# Patient Record
Sex: Female | Born: 1965
Health system: Southern US, Community
[De-identification: ages and names within clinical notes are randomized; demographics above are authoritative.]

## PROBLEM LIST (undated history)

## (undated) DIAGNOSIS — E875 Hyperkalemia: Secondary | ICD-10-CM

## (undated) DIAGNOSIS — E559 Vitamin D deficiency, unspecified: Secondary | ICD-10-CM

## (undated) DIAGNOSIS — E669 Obesity, unspecified: Secondary | ICD-10-CM

## (undated) DIAGNOSIS — G43909 Migraine, unspecified, not intractable, without status migrainosus: Secondary | ICD-10-CM

## (undated) DIAGNOSIS — E538 Deficiency of other specified B group vitamins: Secondary | ICD-10-CM

## (undated) DIAGNOSIS — K769 Liver disease, unspecified: Secondary | ICD-10-CM

## (undated) DIAGNOSIS — K219 Gastro-esophageal reflux disease without esophagitis: Secondary | ICD-10-CM

## (undated) DIAGNOSIS — K760 Fatty (change of) liver, not elsewhere classified: Secondary | ICD-10-CM

## (undated) DIAGNOSIS — B019 Varicella without complication: Secondary | ICD-10-CM

## (undated) DIAGNOSIS — R942 Abnormal results of pulmonary function studies: Secondary | ICD-10-CM

## (undated) DIAGNOSIS — I10 Essential (primary) hypertension: Secondary | ICD-10-CM

## (undated) DIAGNOSIS — M1990 Unspecified osteoarthritis, unspecified site: Secondary | ICD-10-CM

## (undated) HISTORY — DX: Migraine, unspecified, not intractable, without status migrainosus: G43.909

## (undated) HISTORY — DX: Deficiency of other specified B group vitamins: E53.8

## (undated) HISTORY — DX: Gastro-esophageal reflux disease without esophagitis: K21.9

## (undated) HISTORY — DX: Liver disease, unspecified: K76.9

## (undated) HISTORY — DX: Hyperkalemia: E87.5

## (undated) HISTORY — DX: Varicella without complication: B01.9

## (undated) HISTORY — DX: Obesity, unspecified: E66.9

## (undated) HISTORY — DX: Fatty (change of) liver, not elsewhere classified: K76.0

## (undated) HISTORY — DX: Unspecified osteoarthritis, unspecified site: M19.90

## (undated) HISTORY — DX: Essential (primary) hypertension: I10

## (undated) HISTORY — PX: MEDIAL PARTIAL KNEE REPLACEMENT: SHX5965

## (undated) HISTORY — DX: Vitamin D deficiency, unspecified: E55.9

## (undated) HISTORY — DX: Abnormal results of pulmonary function studies: R94.2

---

## 1999-03-02 ENCOUNTER — Encounter: Payer: Self-pay | Admitting: Family Medicine

## 1999-03-02 ENCOUNTER — Encounter: Admission: RE | Admit: 1999-03-02 | Discharge: 1999-03-02 | Payer: Self-pay | Admitting: Family Medicine

## 2000-03-21 ENCOUNTER — Encounter: Admission: RE | Admit: 2000-03-21 | Discharge: 2000-03-21 | Payer: Self-pay | Admitting: Family Medicine

## 2000-03-21 ENCOUNTER — Encounter: Payer: Self-pay | Admitting: Family Medicine

## 2001-03-13 ENCOUNTER — Encounter: Payer: Self-pay | Admitting: Family Medicine

## 2001-03-13 ENCOUNTER — Encounter: Admission: RE | Admit: 2001-03-13 | Discharge: 2001-03-13 | Payer: Self-pay | Admitting: Family Medicine

## 2002-09-13 ENCOUNTER — Ambulatory Visit (HOSPITAL_BASED_OUTPATIENT_CLINIC_OR_DEPARTMENT_OTHER): Admission: RE | Admit: 2002-09-13 | Discharge: 2002-09-13 | Payer: Self-pay | Admitting: Pulmonary Disease

## 2003-11-03 ENCOUNTER — Other Ambulatory Visit: Admission: RE | Admit: 2003-11-03 | Discharge: 2003-11-03 | Payer: Self-pay | Admitting: Obstetrics & Gynecology

## 2004-08-12 ENCOUNTER — Inpatient Hospital Stay (HOSPITAL_COMMUNITY): Admission: AD | Admit: 2004-08-12 | Discharge: 2004-08-14 | Payer: Self-pay | Admitting: Obstetrics & Gynecology

## 2004-11-22 ENCOUNTER — Other Ambulatory Visit: Admission: RE | Admit: 2004-11-22 | Discharge: 2004-11-22 | Payer: Self-pay | Admitting: Obstetrics & Gynecology

## 2006-06-13 ENCOUNTER — Encounter: Admission: RE | Admit: 2006-06-13 | Discharge: 2006-06-13 | Payer: Self-pay | Admitting: Obstetrics & Gynecology

## 2007-02-13 ENCOUNTER — Ambulatory Visit: Payer: Self-pay | Admitting: Gastroenterology

## 2007-02-13 LAB — CONVERTED CEMR LAB
A-1 Antitrypsin, Ser: 230 mg/dL — ABNORMAL HIGH (ref 83–200)
Angiotensin 1 Converting Enzyme: 22 units/L (ref 9–67)
Anti Nuclear Antibody(ANA): NEGATIVE
HCV Ab: NEGATIVE
Hepatitis B Surface Ag: NEGATIVE
Iron: 77 ug/dL (ref 42–145)
Saturation Ratios: 18.3 % — ABNORMAL LOW (ref 20.0–50.0)
Total Protein: 7.4 g/dL (ref 6.0–8.3)

## 2007-04-10 ENCOUNTER — Ambulatory Visit: Payer: Self-pay | Admitting: Cardiology

## 2007-04-10 DIAGNOSIS — D134 Benign neoplasm of liver: Secondary | ICD-10-CM

## 2007-04-18 ENCOUNTER — Ambulatory Visit: Payer: Self-pay | Admitting: Gastroenterology

## 2007-06-22 DIAGNOSIS — R945 Abnormal results of liver function studies: Secondary | ICD-10-CM

## 2007-06-22 DIAGNOSIS — G4733 Obstructive sleep apnea (adult) (pediatric): Secondary | ICD-10-CM | POA: Insufficient documentation

## 2007-06-22 DIAGNOSIS — G43909 Migraine, unspecified, not intractable, without status migrainosus: Secondary | ICD-10-CM | POA: Insufficient documentation

## 2007-09-24 ENCOUNTER — Telehealth (INDEPENDENT_AMBULATORY_CARE_PROVIDER_SITE_OTHER): Payer: Self-pay

## 2007-10-02 ENCOUNTER — Telehealth: Payer: Self-pay | Admitting: Gastroenterology

## 2007-10-04 ENCOUNTER — Ambulatory Visit: Payer: Self-pay | Admitting: Cardiology

## 2008-12-17 ENCOUNTER — Telehealth: Payer: Self-pay | Admitting: Gastroenterology

## 2009-01-02 ENCOUNTER — Encounter: Admission: RE | Admit: 2009-01-02 | Discharge: 2009-01-02 | Payer: Self-pay | Admitting: Obstetrics & Gynecology

## 2009-01-06 ENCOUNTER — Ambulatory Visit: Payer: Self-pay | Admitting: Cardiology

## 2009-01-09 ENCOUNTER — Encounter: Admission: RE | Admit: 2009-01-09 | Discharge: 2009-01-09 | Payer: Self-pay | Admitting: Obstetrics & Gynecology

## 2009-06-29 ENCOUNTER — Telehealth (INDEPENDENT_AMBULATORY_CARE_PROVIDER_SITE_OTHER): Payer: Self-pay | Admitting: *Deleted

## 2009-12-09 ENCOUNTER — Telehealth (INDEPENDENT_AMBULATORY_CARE_PROVIDER_SITE_OTHER): Payer: Self-pay

## 2009-12-11 ENCOUNTER — Encounter: Payer: Self-pay | Admitting: Gastroenterology

## 2009-12-15 ENCOUNTER — Telehealth (INDEPENDENT_AMBULATORY_CARE_PROVIDER_SITE_OTHER): Payer: Self-pay

## 2010-01-19 ENCOUNTER — Encounter: Admission: RE | Admit: 2010-01-19 | Discharge: 2010-01-19 | Payer: Self-pay | Admitting: Obstetrics & Gynecology

## 2010-02-08 ENCOUNTER — Telehealth: Payer: Self-pay | Admitting: Gastroenterology

## 2010-02-17 ENCOUNTER — Telehealth: Payer: Self-pay | Admitting: Gastroenterology

## 2010-03-29 ENCOUNTER — Telehealth (INDEPENDENT_AMBULATORY_CARE_PROVIDER_SITE_OTHER): Payer: Self-pay

## 2010-03-29 ENCOUNTER — Encounter: Admission: RE | Admit: 2010-03-29 | Discharge: 2010-03-29 | Payer: Self-pay | Admitting: Gastroenterology

## 2010-03-31 ENCOUNTER — Encounter
Admission: RE | Admit: 2010-03-31 | Discharge: 2010-03-31 | Payer: Self-pay | Source: Home / Self Care | Admitting: Gastroenterology

## 2010-05-16 ENCOUNTER — Encounter: Payer: Self-pay | Admitting: Gastroenterology

## 2010-05-25 NOTE — Progress Notes (Signed)
Summary: Sch'd MRI  Phone Note Call from Patient Call back at [phone]   Caller: Patient Call For: Dr. Russella Dar Reason for Call: Talk to Nurse Summary of Call: Needs to sch'd her MRI appt. Initial call taken by: Karna Christmas,  February 17, 2010 10:23 AM  Follow-up for Phone Call        Patient  is scheduled for MRI abdomen wiht and without contrast attention to the liver at [facility] for 02/26/10 8:00 Follow-up by: Darcey Nora RN, CGRN,  February 17, 2010 11:11 AM

## 2010-05-25 NOTE — Letter (Signed)
Summary: Appointment Reminder  [facility]  [address] [place], [place] [address]   Phone: [phone]  Fax: [phone]        December 11, 2009 MRN: [id number]    [name] [name] [address] [place], [place]  [address]    Dear Ms. Sculley,   We have been unable to reach you by phone to schedule a follow up CT   scan appointment that was recommended for you by Dr. Russella Dar.  It is very   important that we reach you to schedule an appointment. We hope that you  allow [place] to participate in your health care needs. Please contact us at  [phone] at your earliest convenience to schedule your appointment.     Sincerely,    Darcey Nora RN, CGRN

## 2010-05-25 NOTE — Progress Notes (Signed)
Summary: Records request from [facility]  Request for records received from [facility]. Request forwarded to [employer]. Dena Chavis  June 29, 2009 4:26 PM

## 2010-05-25 NOTE — Progress Notes (Signed)
----   Converted from flag ---- ---- 02/08/2010 9:07 AM, Karna Christmas wrote: Dr. Barbaraann Barthel would like to discuss pt.'s liver findings at your convenience.  # [id number] ------------------------------  02/08/10 @ 1140. Returned Dr. Barbaraann Barthel call. Pt. interested in when to stop imaging follow up studies and why MRI this time. Outlined [facility] could be diagnosed with MRI and Eovist and MR has no radiation exposure. We can discontinue folllow up imaging if the liver lesion is stable or smaller and has no worisome features on MRI.

## 2010-05-25 NOTE — Progress Notes (Signed)
Summary: Schedule MRI  Phone Note Outgoing Call Call back at [facility] Phone [phone] Call back at [phone]   Call placed by: Darcey Nora RN, CGRN,  December 15, 2009 8:40 AM Call placed to: Patient Summary of Call: Patient  returned my call about scheduling MRI recommended from CT scan 12/2008.  Patient  asks if this is something that can be delayed, she has multiple medical bills now.  Please review CT scan from 12/2008 and advise. Initial call taken by: Darcey Nora RN, CGRN,  December 15, 2009 8:43 AM  Follow-up for Phone Call        Can delay 3-6 months Follow-up by: Meryl Dare MD Clementeen Graham,  December 15, 2009 8:53 AM  Additional Follow-up for Phone Call Additional follow up Details #1::        Patient  aware, I will contact her in January to reschedule Additional Follow-up by: Darcey Nora RN, CGRN,  December 15, 2009 9:16 AM     Appended Document: Schedule MRI MRI with Eovist when she is ready to schedule

## 2010-05-25 NOTE — Progress Notes (Signed)
Summary: MRI done without EOVIST  Phone Note Outgoing Call   Call placed by: Darcey Nora RN, CGRN,  March 29, 2010 4:32 PM Call placed to: [facility] Summary of Call: I spoke with [place] at [facility].  MRI was supposed to be done with Eovist not contrast.  They will look into it and call me back about what needs to be done. Initial call taken by: Darcey Nora RN, CGRN,  March 29, 2010 4:33 PM  Follow-up for Phone Call        I spoke with Vickie from [facility] they will contact the patient to come back for additional imaging.  Dr Russella Dar aware..  Per Vickie patient will not be charged for additional MRI Follow-up by: Darcey Nora RN, CGRN,  March 29, 2010 4:42 PM

## 2010-05-25 NOTE — Progress Notes (Signed)
Summary: Schedule repeat MRI  Phone Note Outgoing Call Call back at [facility] Phone [phone]   Call placed by: Darcey Nora RN, CGRN,  December 09, 2009 10:19 AM Call placed to: Patient Summary of Call: Left message for patient to call back to discuss scheduling repeat MRI Initial call taken by: Darcey Nora RN, CGRN,  December 09, 2009 10:20 AM  Follow-up for Phone Call        Left message for patient to call back Darcey Nora RN, [facility]  December 10, 2009 10:02 AM  Left message for patient to call back.  NO return calls  from patient I have mailed her a letter. Follow-up by: Darcey Nora RN, CGRN,  December 11, 2009 11:14 AM

## 2010-09-07 NOTE — Assessment & Plan Note (Signed)
[facility]                         GASTROENTEROLOGY OFFICE NOTE   NAME:[name] [name], [name] [name]                   MRN:          [id number]  DATE:02/13/2007                            DOB:          September 24, 1965    REFERRING PHYSICIAN:  Marjory Lies, M.D.   REASON FOR CONSULTATION:  Liver lesion on ultrasound and CT scan.   HISTORY OF PRESENT ILLNESS:  The patient is a 45 year old white female  who is previously evaluated by Iva Boop, MD, Clementeen Graham.  I see her  husband as a patient and she has requested to change gastroenterologists  for ongoing care.  She was evaluated by Dr. Leone Payor in 2004 for abnormal  liver function tests which were felt to be likely due to fatty liver.  She had a history of elevated triglycerides and obesity.  In addition,  she has a brother with nonalcoholic steatohepatitis.  Standard liver  serologies were unremarkable.  She recently developed problems with  right upper quadrant and epigastric pain with a chest fullness, gas, and  belching and she underwent an abdominal ultrasound on January 09, 2007, at [facility] which showed fatty infiltration of the  liver and 2.8 x 3.5 cm smooth hypoechoic left hepatic lobe lesion.  The  remainder of the ultrasound study was unremarkable.  She subsequently  had a CT scan of the abdomen at [facility] on January 11, 2007, which showed an enhancing oval well-circumscribed lesion in the  left lobe of the liver correlating with the lesion on ultrasound.  The  lesion measured 2.8 cm x 2.2 cm and was felt likely to represent a  benign lesion, most likely a hepatic adenoma.  In addition, she had  extensive fatty infiltration in the remainder of the liver parenchyma.  The remainder of the CT was unremarkable.  A recent lipid panel revealed  a slightly elevated cholesterol of 207 and elevated triglycerides at  171.  Recent liver function tests showed an AST of 62, ALT 85,  and total  bilirubin of 1.3.  She states that she has been on birth control pills  for approximately 25 years.  She has had improvement in her epigastric  pain, right upper quadrant pain, and belching with dietary modifications  and over-the-counter acid reliever.  She notes no dysphagia,  odynophagia, nausea, vomiting, change in bowel habits, change in stool  caliber, diarrhea, constipation, melena, or hematochezia.  Her father  has a history of colon polyps, and her brother has nonalcoholic  steatohepatitis.  No family history of colon cancer or inflammatory  bowel disease.   PAST MEDICAL HISTORY:  Headaches, sleep apnea, nonalcoholic  steatohepatitis.   CURRENT MEDICATIONS:  Loestrin FE 24 one daily.   ALLERGIES:  No known drug allergies.   SOCIAL HISTORY:   REVIEW OF SYSTEMS:  Per the handwritten form.   PHYSICAL EXAMINATION:  GENERAL:  Overweight white female who is mildly  anxious.  VITAL SIGNS:  Height 5 feet 4 inches, weight 178.2 pounds, blood  pressure 122/80, pulse 72 and regular.  HEENT:  Anicteric sclerae, oropharynx clear.  CHEST:  Clear to auscultation bilaterally.  HEART:  Regular rate and rhythm without murmurs appreciated.  ABDOMEN:  Soft, nontender, nondistended, normal active bowel sounds, no  palpable organomegaly, masses, or hernia.  EXTREMITIES:  Without cyanosis, clubbing, or edema.  NEUROLOGY:  Alert and oriented x3, grossly nonfocal.   ASSESSMENT:  1. Left hepatic lobe lesion measuring 2.8 x 2.2 cm on CT scan with      benign features.  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  We will      plan for a follow-up CT scan near the end of December 2008 to      assess for any interval change.  I have asked her to return to see      Gerrit Friends. Aldona Bar, M.D. to consider discontinuing her current birth      control pills as they can stimulate the growth of hepatic adenomas.  2. Abnormal liver  function tests.  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      We will plan to obtain all standard viral, metabolic, and genetic      disease hepatic serologies as well as repeat liver function tests      and a prothrombin time.  She is advised to maintain a longterm, low      fat, weight loss diet.  Consider liver biopsy for further      evaluation in the future.  3. Presumed gastroesophageal reflux disease.  Begin all standard      antireflux measures and use an over-the-counter acid reliever such      as Pepcid AC b.i.d. p.r.n. If her symptoms worsen, we will consider      further evaluation with upper endoscopy.  4. Return office visit with me in January of 2009 after her follow-up      CT scan.     Venita Lick. Russella Dar, MD, [facility]  Electronically Signed    MTS/MedQ  DD: 02/13/2007  DT: 02/14/2007  Job #: [id number]   cc:   Marjory Lies, M.D.  Gerrit Friends. Aldona Bar, M.D.

## 2010-09-07 NOTE — Assessment & Plan Note (Signed)
[facility]                         GASTROENTEROLOGY OFFICE NOTE   NAME:[name] [name] ACERO                   MRN:          [id number]  DATE:04/18/2007                            DOB:          01-25-1966    [name] [name] returns for followup of a liver lesion on imaging studies.  Followup CT scan from April 10, 2007 showed a 3 cm well circumscribed  lesion within the ventral surface of the inferior aspect of the lateral  segment of the left lobe of the liver.  This was felt very likely to  represent an hepatic adenoma.  It had not changed appreciably in size  from prior imaging studies.  She has no gastrointestinal complaints and  feels well.  She has multiple questions about the liver lesion and I  attempted to answer all of them.   CURRENT MEDICATIONS:  OTC acid reducer p.r.n.   MEDICATION ALLERGIES:  None known.   PHYSICAL EXAMINATION:  GENERAL APPEARANCE:  No acute distress.  VITAL SIGNS:  Weight 181.8 pounds.  Blood pressure 122/98.  Pulse 68 and  regular.  She is not re-examined.   ASSESSMENT/PLAN:  A 3 cm liver lesion which is likely to be an hepatic  adenoma.  I had previously recommended a followup CT scan in one year  but due to her concerns and the lack of diagnostic certainty without a  biopsy, we will plan for followup CT scan in June of 2009.  She is  advised to contact me if she has any new gastrointestinal complaints.  Return office visit in six months.     Venita Lick. Russella Dar, MD, [facility]  Electronically Signed    MTS/MedQ  DD: 04/26/2007  DT: 04/26/2007  Job #: [id number]   cc:   [name] [name], M.D.

## 2011-01-27 ENCOUNTER — Other Ambulatory Visit: Payer: Self-pay | Admitting: Obstetrics & Gynecology

## 2011-01-27 DIAGNOSIS — Z1231 Encounter for screening mammogram for malignant neoplasm of breast: Secondary | ICD-10-CM

## 2011-02-04 ENCOUNTER — Ambulatory Visit
Admission: RE | Admit: 2011-02-04 | Discharge: 2011-02-04 | Disposition: A | Discharge status: Home / Self Care | Payer: Commercial Managed Care - PPO | Source: Ambulatory Visit | Attending: Obstetrics & Gynecology | Admitting: Obstetrics & Gynecology

## 2011-02-04 DIAGNOSIS — Z1231 Encounter for screening mammogram for malignant neoplasm of breast: Secondary | ICD-10-CM

## 2012-01-05 ENCOUNTER — Other Ambulatory Visit: Payer: Self-pay | Admitting: Obstetrics & Gynecology

## 2012-01-05 DIAGNOSIS — Z1231 Encounter for screening mammogram for malignant neoplasm of breast: Secondary | ICD-10-CM

## 2012-02-06 ENCOUNTER — Ambulatory Visit
Admission: RE | Admit: 2012-02-06 | Discharge: 2012-02-06 | Disposition: A | Discharge status: Home / Self Care | Payer: Commercial Managed Care - PPO | Source: Ambulatory Visit | Attending: Obstetrics & Gynecology | Admitting: Obstetrics & Gynecology

## 2012-02-06 DIAGNOSIS — Z1231 Encounter for screening mammogram for malignant neoplasm of breast: Secondary | ICD-10-CM

## 2013-01-31 ENCOUNTER — Other Ambulatory Visit: Payer: Self-pay

## 2013-01-31 DIAGNOSIS — Z1231 Encounter for screening mammogram for malignant neoplasm of breast: Secondary | ICD-10-CM

## 2013-02-25 ENCOUNTER — Ambulatory Visit
Admission: RE | Admit: 2013-02-25 | Discharge: 2013-02-25 | Disposition: A | Discharge status: Home / Self Care | Payer: Commercial Managed Care - PPO | Source: Ambulatory Visit

## 2013-02-25 DIAGNOSIS — Z1231 Encounter for screening mammogram for malignant neoplasm of breast: Secondary | ICD-10-CM

## 2014-02-03 ENCOUNTER — Other Ambulatory Visit: Payer: Self-pay

## 2014-02-03 DIAGNOSIS — Z1239 Encounter for other screening for malignant neoplasm of breast: Secondary | ICD-10-CM

## 2014-02-27 ENCOUNTER — Encounter (INDEPENDENT_AMBULATORY_CARE_PROVIDER_SITE_OTHER): Payer: Self-pay

## 2014-02-27 ENCOUNTER — Other Ambulatory Visit: Payer: Self-pay

## 2014-02-27 ENCOUNTER — Ambulatory Visit
Admission: RE | Admit: 2014-02-27 | Discharge: 2014-02-27 | Disposition: A | Discharge status: Home / Self Care | Payer: Commercial Managed Care - PPO | Source: Ambulatory Visit

## 2014-02-27 DIAGNOSIS — Z1231 Encounter for screening mammogram for malignant neoplasm of breast: Secondary | ICD-10-CM

## 2014-08-14 ENCOUNTER — Encounter: Payer: Self-pay | Admitting: Gastroenterology

## 2014-12-11 ENCOUNTER — Other Ambulatory Visit: Payer: Self-pay | Admitting: Sports Medicine

## 2014-12-11 ENCOUNTER — Ambulatory Visit (INDEPENDENT_AMBULATORY_CARE_PROVIDER_SITE_OTHER): Payer: 59 | Admitting: Sports Medicine

## 2014-12-11 ENCOUNTER — Ambulatory Visit
Admission: RE | Admit: 2014-12-11 | Discharge: 2014-12-11 | Disposition: A | Discharge status: Home / Self Care | Payer: 59 | Source: Ambulatory Visit | Attending: Sports Medicine | Admitting: Sports Medicine

## 2014-12-11 ENCOUNTER — Encounter: Payer: Self-pay | Admitting: Sports Medicine

## 2014-12-11 VITALS — BP 139/74 | Temp 98.0°F | Wt 194.0 lb

## 2014-12-11 DIAGNOSIS — M25561 Pain in right knee: Secondary | ICD-10-CM

## 2014-12-11 DIAGNOSIS — M25461 Effusion, right knee: Secondary | ICD-10-CM | POA: Diagnosis not present

## 2014-12-11 NOTE — Progress Notes (Signed)
  Jena Tegeler - 49 y.o. female MRN [id number]  Date of birth: 18-Sep-1965   Nargis Abrams is a 49 y.o. female who presents today for right knee pain and effusion. 3 years ago she was showing her daughter how to do a round off. At that time she landed awkwardly and had a large knee effusion afterwards. Since that time she has had intermittent knee effusions. She has not seen a doctor for this problem before.  As of late she reports that her knee tends to catch from time to time. When her knee catches she has a sharp pain and then her knee releases and the pain is gone. Her most recent acute episode occurred where she was moving a rug and her knee became swollen. Today the swelling is improved compared to what it was 2 weeks ago. She denies any previous injury or surgery. She denies any numbness or tingling.  PMHx - Updated and reviewed.  Contributory factors include: migraine HA Medications - n/a  ROS Per HPI   Exam:  Filed Vitals:   12/11/14 0859  BP: 139/74  Temp: 98 F (36.7 C)   Gen: NAD Cardiorespiratory - Normal respiratory effort/rate.   Knee Exam:  Laterality: right Appearance: No erythema or ecchymosis, Edema: effusion present   Tenderness: some lateral joint line tenderness  Range of Motion: normal flexion and extension Laxity: observed in both knees with more on the right  Maneuvers: Lachman's: positive  Anterior drawer: positive  McMurray's: neg  Posterior Drawer: neg  Patellar Compression: neg Strength:  Quadricep: 5/5 Hamstring: 5/5 Gait: normal    Imaging:  Limited US: Right Knee  A moderate effusion noted in the suprapatellar pouch. Quadricep and patellar tendon intact. No prepatellar bursitis. Finding consistent with a moderate effusion.

## 2014-12-11 NOTE — Patient Instructions (Signed)
Dr. Micheline Chapman will call you once the results are back with the MRI.

## 2014-12-11 NOTE — Assessment & Plan Note (Addendum)
Swelling of the knee most likely secondary to either an anterior cruciate ligament or meniscal tear based on history and physical exam Symptoms following a traumatic event.  Positive for anterior drawer and Lachman's. - Obtain right knee x-ray - Proceed with MRI w/out contrast of the right knee - Dr. Micheline Chapman will call with the results from MRI. Further management will be based on the results of the MRI  - she was placed in Body Helix knee compression sleeve on her right knee   Patient seen and evaluated with the above resident. I agree with the physical exam findings and plan of care. X-rays were reviewed which show only minimal degenerative changes. Patient will proceed with MRI as ordered to rule out both an anterior cruciate ligament tear as well as a meniscal tear. I will call her with those results once available at which point we will delineate more definitive treatment. In the meantime I've encouraged her to use her body helix compression sleeve and continue with ice and elevation as needed. I will hold on aspiration since her swelling and pain are improving per her report.

## 2014-12-14 ENCOUNTER — Ambulatory Visit
Admission: RE | Admit: 2014-12-14 | Discharge: 2014-12-14 | Disposition: A | Discharge status: Home / Self Care | Payer: 59 | Source: Ambulatory Visit | Attending: Sports Medicine | Admitting: Sports Medicine

## 2014-12-14 DIAGNOSIS — M25461 Effusion, right knee: Secondary | ICD-10-CM

## 2014-12-16 ENCOUNTER — Telehealth: Payer: Self-pay | Admitting: Sports Medicine

## 2014-12-16 NOTE — Telephone Encounter (Signed)
I spoke with the patient on the phone today regarding MRI findings of her right knee. Surprisingly she does not have a meniscal tear. Anterior cruciate ligament is also intact. 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 She also has a small to moderate-sized joint effusion. Based on these findings I recommended that the patient return to the office for a cortisone injection. I will couple this with a home exercise program to help strengthen the knee. If she continues to have symptoms after the injection then we can consider referral to orthopedics.

## 2014-12-22 ENCOUNTER — Encounter: Payer: Self-pay | Admitting: Sports Medicine

## 2014-12-22 ENCOUNTER — Ambulatory Visit (INDEPENDENT_AMBULATORY_CARE_PROVIDER_SITE_OTHER): Payer: 59 | Admitting: Sports Medicine

## 2014-12-22 VITALS — BP 123/70 | HR 66 | Ht 64.0 in | Wt 190.0 lb

## 2014-12-22 DIAGNOSIS — M1711 Unilateral primary osteoarthritis, right knee: Secondary | ICD-10-CM | POA: Diagnosis not present

## 2014-12-22 MED ORDER — MELOXICAM 15 MG PO TABS: ORAL_TABLET | ORAL | Status: DC

## 2014-12-22 NOTE — Progress Notes (Signed)
Patient ID: [name] [name], female   DOB: 02-Mar-1966, 49 y.o.   MRN: [id number]  Patient comes in today with her husband to discuss MRI findings of her right knee. I had discussed these findings with her last week on the telephone. Dominant findings are degenerative changes in the knee particularly at the patellofemoral joint. She does have some fraying of the medial meniscus but no tear is seen. Cruciate and collateral ligaments are intact. We have discussed the possibility of a cortisone injection on the phone but since that conversation her knee pain has improved. She has minimal swelling. In fact she was able to work in her yard over the weekend without too much of an issue. She does still get some feelings of instability in this knee.  I had a long conversation with both her and her husband. We discussed treatment options such as oral anti-inflammatories, cortisone injections, Visco supplementation, and physical therapy. I've given her a prescription for meloxicam and I'll send her for physical therapy at [facility]. She should continue with her compression sleeve with activity. She would like to hold on her cortisone injection for now. She does understand that this is a treatment option that we can reconsider at a later date. She will wean from formal physical therapy to a home exercise program per the therapist's discretion and follow-up with me as needed.

## 2015-01-09 ENCOUNTER — Other Ambulatory Visit: Payer: Self-pay

## 2015-01-09 DIAGNOSIS — Z1231 Encounter for screening mammogram for malignant neoplasm of breast: Secondary | ICD-10-CM

## 2015-02-27 ENCOUNTER — Encounter: Payer: Self-pay | Admitting: Gastroenterology

## 2015-03-10 ENCOUNTER — Ambulatory Visit
Admission: RE | Admit: 2015-03-10 | Discharge: 2015-03-10 | Disposition: A | Discharge status: Home / Self Care | Payer: 59 | Source: Ambulatory Visit

## 2015-03-10 DIAGNOSIS — Z1231 Encounter for screening mammogram for malignant neoplasm of breast: Secondary | ICD-10-CM

## 2015-04-24 ENCOUNTER — Ambulatory Visit (AMBULATORY_SURGERY_CENTER): Payer: Self-pay | Admitting: *Deleted

## 2015-04-24 VITALS — Ht 64.0 in | Wt 195.0 lb

## 2015-04-24 DIAGNOSIS — Z1211 Encounter for screening for malignant neoplasm of colon: Secondary | ICD-10-CM

## 2015-04-24 MED ORDER — NA SULFATE-K SULFATE-MG SULF 17.5-3.13-1.6 GM/177ML PO SOLN: 1.0000 | Freq: Once | ORAL | Status: DC

## 2015-04-24 NOTE — Progress Notes (Signed)
Denies allergies to eggs or soy products. Denies complications with sedation or anesthesia. Denies O2 use. Denies use of diet or weight loss medications.  Emmi instructions given for colonoscopy.  

## 2015-04-29 MED FILL — HYDROCHLOROTHIAZIDE 25 MG T: 25 | 90 days supply | Qty: 90 | Fill #1

## 2015-05-01 MED FILL — SUPREP BOWEL PREP KIT: 17.5-3.13-1 | 1 days supply | Qty: 354 | Fill #0

## 2015-05-08 ENCOUNTER — Encounter: Payer: 59 | Admitting: Gastroenterology

## 2015-05-08 ENCOUNTER — Ambulatory Visit (AMBULATORY_SURGERY_CENTER): Payer: 59 | Admitting: Gastroenterology

## 2015-05-08 ENCOUNTER — Encounter: Payer: Self-pay | Admitting: Gastroenterology

## 2015-05-08 VITALS — BP 123/76 | HR 70 | Temp 98.3°F | Resp 13 | Ht 64.0 in | Wt 195.0 lb

## 2015-05-08 DIAGNOSIS — Z1211 Encounter for screening for malignant neoplasm of colon: Secondary | ICD-10-CM

## 2015-05-08 LAB — HM COLONOSCOPY

## 2015-05-08 MED ORDER — SODIUM CHLORIDE 0.9 % IV SOLN: 500.0000 mL | INTRAVENOUS | Status: DC

## 2015-05-08 NOTE — Progress Notes (Signed)
No egg or soy allergy known to patient  No issues with past sedation with any surgeries  or procedures, no intubation problems  No diet pills with phentermine since 04-24-2015 No home 02 use per patient

## 2015-05-08 NOTE — Patient Instructions (Signed)
Impressions/recommendations:  Normal colonoscopy  Repeat colonoscopy in 10 years.  YOU HAD AN ENDOSCOPIC PROCEDURE TODAY AT THE [facility]:   Refer to the procedure report that was given to you for any specific questions about what was found during the examination.  If the procedure report does not answer your questions, please call your gastroenterologist to clarify.  If you requested that your care partner not be given the details of your procedure findings, then the procedure report has been included in a sealed envelope for you to review at your convenience later.  YOU SHOULD EXPECT: Some feelings of bloating in the abdomen. Passage of more gas than usual.  Walking can help get rid of the air that was put into your GI tract during the procedure and reduce the bloating. If you had a lower endoscopy (such as a colonoscopy or flexible sigmoidoscopy) you may notice spotting of blood in your stool or on the toilet paper. If you underwent a bowel prep for your procedure, you may not have a normal bowel movement for a few days.  Please Note:  You might notice some irritation and congestion in your nose or some drainage.  This is from the oxygen used during your procedure.  There is no need for concern and it should clear up in a day or so.  SYMPTOMS TO REPORT IMMEDIATELY:   Following lower endoscopy (colonoscopy or flexible sigmoidoscopy):  Excessive amounts of blood in the stool  Significant tenderness or worsening of abdominal pains  Swelling of the abdomen that is new, acute  Fever of 100F or higher  For urgent or emergent issues, a gastroenterologist can be reached at any hour by calling [phone].   DIET: Your first meal following the procedure should be a small meal and then it is ok to progress to your normal diet. Heavy or fried foods are harder to digest and may make you feel nauseous or bloated.  Likewise, meals heavy in dairy and vegetables can increase bloating.   Drink plenty of fluids but you should avoid alcoholic beverages for 24 hours.  ACTIVITY:  You should plan to take it easy for the rest of today and you should NOT DRIVE or use heavy machinery until tomorrow (because of the sedation medicines used during the test).    FOLLOW UP: Our staff will call the number listed on your records the next business day following your procedure to check on you and address any questions or concerns that you may have regarding the information given to you following your procedure. If we do not reach you, we will leave a message.  However, if you are feeling well and you are not experiencing any problems, there is no need to return our call.  We will assume that you have returned to your regular daily activities without incident.  If any biopsies were taken you will be contacted by phone or by letter within the next 1-3 weeks.  Please call us at [phone] if you have not heard about the biopsies in 3 weeks.    SIGNATURES/CONFIDENTIALITY: You and/or your care partner have signed paperwork which will be entered into your electronic medical record.  These signatures attest to the fact that that the information above on your After Visit Summary has been reviewed and is understood.  Full responsibility of the confidentiality of this discharge information lies with you and/or your care-partner. 

## 2015-05-08 NOTE — Op Note (Signed)
[facility]  [address] [place], [address]   COLONOSCOPY PROCEDURE REPORT  PATIENT: [name], [name]  MR#: [id number] BIRTHDATE: [birth year] , 50  yrs. old GENDER: female ENDOSCOPIST: Ladene Artist, MD, [facility] REFERRED BY: Milagros Evener MD PROCEDURE DATE:  05/08/2015 PROCEDURE:   Colonoscopy, screening First Screening Colonoscopy - Avg.  risk and is 50 yrs.  old or older Yes.  Prior Negative Screening - Now for repeat screening. N/A  History of Adenoma - Now for follow-up colonoscopy & has been > or = to 3 yrs.  N/A  Polyps removed today? No Recommend repeat exam, <10 yrs? No ASA CLASS:   Class II INDICATIONS:Screening for colonic neoplasia and Colorectal Neoplasm Risk Assessment for this procedure is average risk. MEDICATIONS: Monitored anesthesia care and Propofol 200 mg IV DESCRIPTION OF PROCEDURE:   After the risks benefits and alternatives of the procedure were thoroughly explained, informed consent was obtained.  The digital rectal exam revealed no abnormalities of the rectum.   The LB [id number]  endoscope was introduced through the anus and advanced to the cecum, which was identified by both the appendix and ileocecal valve. No adverse events experienced.   The quality of the prep was excellent. (Suprep was used)  The instrument was then slowly withdrawn as the colon was fully examined. Estimated blood loss is zero unless otherwise noted in this procedure report.    COLON FINDINGS: A normal appearing cecum, ileocecal valve, and appendiceal orifice were identified.  The ascending, transverse, descending, sigmoid colon, and rectum appeared unremarkable. Retroflexed views revealed no abnormalities. The time to cecum = 4.2 Withdrawal time = 10.0   The scope was withdrawn and the procedure completed. COMPLICATIONS: There were no immediate complications.  ENDOSCOPIC IMPRESSION: Normal colonoscopy  RECOMMENDATIONS: Continue current colorectal  screening recommendations for "routine risk" patients with a repeat colonoscopy in 10 years.  eSigned:  Ladene Artist, MD, [facility] 05/08/2015 1:57 PM

## 2015-05-08 NOTE — Progress Notes (Signed)
A/ox3 pleased with MAC, report to Wendy RN 

## 2015-05-11 ENCOUNTER — Telehealth: Payer: Self-pay | Admitting: *Deleted

## 2015-05-11 NOTE — Telephone Encounter (Signed)
  Follow up Call-  Call back number 05/08/2015  Post procedure Call Back phone  # [phone]  Permission to leave phone message Yes     Patient questions:  Do you have a fever, pain , or abdominal swelling? No. Pain Score  0 *  Have you tolerated food without any problems? Yes.    Have you been able to return to your normal activities? Yes.    Do you have any questions about your discharge instructions: Diet   No. Medications  No. Follow up visit  No.  Do you have questions or concerns about your Care? No.  Actions: * If pain score is 4 or above: No action needed, pain <4.

## 2015-05-29 MED FILL — AMOX-CLAV 500-125 MG TABLET: 500-125 | 7 days supply | Qty: 21 | Fill #0

## 2015-05-29 MED FILL — NEO/POLYMYXIN/DEXAMETH DROP: 3.5-10000-0 | 30 days supply | Qty: 5 | Fill #0

## 2015-06-03 ENCOUNTER — Other Ambulatory Visit: Payer: Self-pay | Admitting: Family Medicine

## 2015-06-03 DIAGNOSIS — R1310 Dysphagia, unspecified: Secondary | ICD-10-CM

## 2015-06-15 ENCOUNTER — Other Ambulatory Visit: Payer: 59

## 2015-07-06 ENCOUNTER — Ambulatory Visit
Admission: RE | Admit: 2015-07-06 | Discharge: 2015-07-06 | Disposition: A | Discharge status: Home / Self Care | Payer: 59 | Source: Ambulatory Visit | Attending: Family Medicine | Admitting: Family Medicine

## 2015-07-06 DIAGNOSIS — R1310 Dysphagia, unspecified: Secondary | ICD-10-CM

## 2015-07-27 MED FILL — HYDROCHLOROTHIAZIDE 25 MG T: 25 | 30 days supply | Qty: 30 | Fill #0

## 2015-07-28 MED FILL — PHENTERMINE 37.5 MG TABLET: 37.5 | 30 days supply | Qty: 30 | Fill #1

## 2015-08-31 MED FILL — PHENTERMINE 37.5 MG TABLET: 37.5 | 30 days supply | Qty: 30 | Fill #2

## 2015-08-31 MED FILL — HYDROCHLOROTHIAZIDE 25 MG T: 25 | 30 days supply | Qty: 30 | Fill #1

## 2015-10-20 ENCOUNTER — Ambulatory Visit (INDEPENDENT_AMBULATORY_CARE_PROVIDER_SITE_OTHER): Payer: 59 | Admitting: Physician Assistant

## 2015-10-20 VITALS — BP 136/86 | HR 75 | Temp 98.0°F | Resp 17 | Ht 64.5 in | Wt 188.0 lb

## 2015-10-20 DIAGNOSIS — S01511A Laceration without foreign body of lip, initial encounter: Secondary | ICD-10-CM

## 2015-10-20 MED ORDER — AMOXICILLIN-POT CLAVULANATE 875-125 MG PO TABS: 1.0000 | ORAL_TABLET | Freq: Two times a day (BID) | ORAL | Status: DC

## 2015-10-20 MED FILL — AMOX TR-K CLV 875-125 MG TA: 875-125 | 10 days supply | Qty: 20 | Fill #0

## 2015-10-20 NOTE — Patient Instructions (Addendum)
Please apply vaseline to the outer lip wound.  The inside will heal in time. Please take abx to prevent an infection.      IF you received an x-ray today, you will receive an invoice from [facility]. Please contact [facility] at [phone] with questions or concerns regarding your invoice.   IF you received labwork today, you will receive an invoice from [employer]. Please contact [facility] at [phone] with questions or concerns regarding your invoice.   Our billing staff will not be able to assist you with questions regarding bills from these companies.  You will be contacted with the lab results as soon as they are available. The fastest way to get your results is to activate your My Chart account. Instructions are located on the last page of this paperwork. If you have not heard from [place] regarding the results in 2 weeks, please contact this office.

## 2015-10-20 NOTE — Progress Notes (Signed)
   10/20/2015 10:10 AM   DOB: 1966-03-07 / MRN: [id number]  SUBJECTIVE:  [name] [name] is a 50 y.o. female presenting for lip pain after falling last night during tennis lessons.  Says that her bottom tooth protruded through her lower lip.  She denies teeth and jaw pain at this time.  Denies HA and head trauma.  Feels well otherwise.     She has No Known Allergies.   She  has a past medical history of Hypertension; GERD (gastroesophageal reflux disease); and Arthritis.    She  reports that she has never smoked. She has never used smokeless tobacco. She reports that she drinks alcohol. She reports that she does not use illicit drugs. She  reports that she does not engage in sexual activity. The patient  has no past surgical history on file.  Her family history includes Cancer in her father, maternal grandmother, and paternal grandmother; Diabetes in her maternal grandfather, maternal grandmother, mother, paternal grandmother, and sister; Heart disease in her paternal grandmother; Hyperlipidemia in her maternal grandfather, maternal grandmother, mother, paternal grandmother, and sister; Hypertension in her maternal grandmother and paternal grandmother. There is no history of Colon cancer, Colon polyps, Rectal cancer, or Stomach cancer.  Review of Systems  Constitutional: Negative for fever.  HENT: Negative for ear discharge and nosebleeds.   Gastrointestinal: Negative for nausea.  Skin: Negative for itching.  Neurological: Negative for dizziness.    Problem list and medications reviewed and updated by myself where necessary, and exist elsewhere in the encounter.   OBJECTIVE:  BP 136/86 mmHg  Pulse 75  Temp(Src) 98 F (36.7 C) (Oral)  Resp 17  Ht 5' 4.5" (1.638 m)  Wt 188 lb (85.276 kg)  BMI 31.78 kg/m2  SpO2 97%  Physical Exam  HENT:  Mouth/Throat:    Cardiovascular: Normal rate and regular rhythm.   Pulmonary/Chest: Effort normal and breath sounds normal.  Neurological:  No cranial nerve deficit.    No results found for this or any previous visit (from the past 72 hour(s)).  No results found.  ASSESSMENT AND PLAN  [name] [name] was seen today for bit lip.  Diagnoses and all orders for this visit:  Lip laceration, initial encounter:  Her tetanus is current.  Both the inner and outer laceration are very well approximated and appear to be healing.  Will cover for infection.  RTC as needed.   -     amoxicillin-clavulanate (AUGMENTIN) 875-125 MG tablet; Take 1 tablet by mouth 2 (two) times daily.    The patient was advised to call or return to clinic if she does not see an improvement in symptoms or to seek the care of the closest emergency department if she worsens with the above plan.   Philis Fendt, MHS, PA-C [facility] 10/20/2015 10:10 AM

## 2016-01-21 ENCOUNTER — Ambulatory Visit: Payer: 59 | Admitting: Family Medicine

## 2016-01-22 ENCOUNTER — Encounter: Payer: Self-pay | Admitting: Family Medicine

## 2016-01-22 ENCOUNTER — Ambulatory Visit (INDEPENDENT_AMBULATORY_CARE_PROVIDER_SITE_OTHER): Payer: 59 | Admitting: Family Medicine

## 2016-01-22 VITALS — BP 143/93 | HR 75 | Ht 64.0 in | Wt 190.0 lb

## 2016-01-22 DIAGNOSIS — M25561 Pain in right knee: Secondary | ICD-10-CM | POA: Diagnosis not present

## 2016-01-22 MED ORDER — METHYLPREDNISOLONE ACETATE 40 MG/ML IJ SUSP
40.0000 mg | Freq: Once | INTRAMUSCULAR | Status: AC
  Administered 2016-01-22: 40 mg via INTRA_ARTICULAR

## 2016-01-22 NOTE — Patient Instructions (Signed)
Your knee pain is due to arthritis. These are the different classes of medicine you can take for this: Tylenol 500mg  1-2 tabs three times a day for pain. Aleve 1-2 tabs twice a day with food Glucosamine sulfate 750mg  twice a day is a supplement that may help. Capsaicin, aspercreme, or biofreeze topically up to four times a day may also help with pain. Cortisone injections are an option - you were given this today. If cortisone injections do not help, there are different types of shots that may help but they take longer to take effect. It's important that you continue to stay active. Straight leg raises, knee extensions 3 sets of 10 once a day (add ankle weight if these become too easy). Shoe inserts with good arch support may be helpful. Heat or ice 15 minutes at a time 3-4 times a day as needed to help with pain. Water aerobics and cycling with low resistance are the best two types of exercise for arthritis. Follow up with me in 1 month for reevaluation.

## 2016-01-23 DIAGNOSIS — I10 Essential (primary) hypertension: Secondary | ICD-10-CM | POA: Insufficient documentation

## 2016-01-23 NOTE — Assessment & Plan Note (Signed)
patient had MRI last year which did not show a meniscus tear but degenerative changes most prominent in patellofemoral compartment.  She has done PT, mobic in past.  She is interested in trying intraarticular cortisone injection which was given today.  We discussed tylenol, other nsaids, glucosamine, topical medications.  F/u in 1 month for reevaluation  After informed written consent, patient was seated on exam table. Right knee was prepped with alcohol swab and utilizing superolateral approach under ultrasound guidance patient's right knee was injected intraarticularly with 3:1 marcaine: depomedrol. Patient tolerated the procedure well without immediate complications.

## 2016-01-23 NOTE — Progress Notes (Signed)
PCP: Aretta Nip, MD  Subjective:   HPI: Patient is a 50 y.o. female here for right knee pain.  Patient reports she's had 4 years of anterior right knee pain. Recalls showing daughter how to do a roundoff and had some pain in this knee. Since then has had intermittent problems with the knee catching and lockingup. Pain is 2/10 level, dull. She was seen in [place] office and did 6-8 weeks of PT with mild improvement. Tried mobic also and sleeps with a pillow between her legs. Tried icing elevating. Worse with walking. No skin changes, numbness.  Past Medical History:  Diagnosis Date  . Arthritis   . GERD (gastroesophageal reflux disease)   . Hypertension     Current Outpatient Prescriptions on File Prior to Visit  Medication Sig Dispense Refill  . hydrochlorothiazide (HYDRODIURIL) 25 MG tablet Take 25 mg by mouth daily.    Marland Kitchen PHENTERMINE HCL PO Take by mouth.     No current facility-administered medications on file prior to visit.     No past surgical history on file.  No Known Allergies  Social History   Social History  . Marital status: Married    Spouse name: N/A  . Number of children: N/A  . Years of education: N/A   Occupational History  . Not on file.   Social History Main Topics  . Smoking status: Never Smoker  . Smokeless tobacco: Never Used  . Alcohol use 0.0 oz/week     Comment: occas  . Drug use: No  . Sexual activity: No   Other Topics Concern  . Not on file   Social History Narrative  . No narrative on file    Family History  Problem Relation Age of Onset  . Cancer Father     larynx cancer  . Colon cancer Neg Hx   . Colon polyps Neg Hx   . Rectal cancer Neg Hx   . Stomach cancer Neg Hx   . Diabetes Mother   . Hyperlipidemia Mother   . Cancer Maternal Grandmother   . Diabetes Maternal Grandmother   . Hyperlipidemia Maternal Grandmother   . Hypertension Maternal Grandmother   . Diabetes Maternal Grandfather   .  Hyperlipidemia Maternal Grandfather   . Cancer Paternal Grandmother   . Diabetes Paternal Grandmother   . Heart disease Paternal Grandmother   . Hyperlipidemia Paternal Grandmother   . Hypertension Paternal Grandmother   . Diabetes Sister   . Hyperlipidemia Sister     BP (!) 143/93   Pulse 75   Ht 5\' 4"  (1.626 m)   Wt 190 lb (86.2 kg)   BMI 32.61 kg/m   Review of Systems: See HPI above.    Objective:  Physical Exam:  Gen: NAD, comfortable in exam room  Right knee: No gross deformity, ecchymoses, swelling. Mild TTP post patellar facets and medial joint line. FROM. Negative ant/post drawers. Negative valgus/varus testing. Negative lachmanns. Negative mcmurrays, apleys, patellar apprehension. NV intact distally.    Left knee: FROM without pain.  Assessment & Plan:  1. Right knee pain - patient had MRI last year which did not show a meniscus tear but degenerative changes most prominent in patellofemoral compartment.  She has done PT, mobic in past.  She is interested in trying intraarticular cortisone injection which was given today.  We discussed tylenol, other nsaids, glucosamine, topical medications.  F/u in 1 month for reevaluation  After informed written consent, patient was seated on exam table. Right knee was  prepped with alcohol swab and utilizing superolateral approach under ultrasound guidance patient's right knee was injected intraarticularly with 3:1 marcaine: depomedrol. Patient tolerated the procedure well without immediate complications.

## 2016-02-05 ENCOUNTER — Ambulatory Visit (INDEPENDENT_AMBULATORY_CARE_PROVIDER_SITE_OTHER): Payer: 59

## 2016-02-05 ENCOUNTER — Ambulatory Visit (INDEPENDENT_AMBULATORY_CARE_PROVIDER_SITE_OTHER): Payer: 59 | Admitting: Family Medicine

## 2016-02-05 VITALS — BP 130/80 | HR 83 | Temp 98.3°F | Resp 16 | Ht 64.0 in | Wt 196.4 lb

## 2016-02-05 DIAGNOSIS — M79671 Pain in right foot: Secondary | ICD-10-CM

## 2016-02-05 MED ORDER — MELOXICAM 15 MG PO TABS: 15.0000 mg | ORAL_TABLET | Freq: Every day | ORAL | 1 refills | Status: DC

## 2016-02-05 MED FILL — MELOXICAM 15 MG TABLET: 15 | 30 days supply | Qty: 30 | Fill #0

## 2016-02-05 NOTE — Progress Notes (Signed)
Patient ID: [name] [name], female    DOB: Mar 27, 1966, 50 y.o.   MRN: [id number]  PCP: Aretta Nip, MD  Chief Complaint  Patient presents with  . Foot Pain    right foot pain x 2 week, pain increase with movement     Subjective:   HPI 50 year old female presents for evaluation of right foot pain times 2 weeks. She is uncertain if she injured her foot. Reports that she is very active and "anything" could have happened.  She reports pain is worst with walking and improve with rest. She has taken ibuprofen and is uncertain if that helped because she osteoarthritis of the knee and has chronic pain associated with knee pain.  Social History   Social History  . Marital status: Married    Spouse name: N/A  . Number of children: N/A  . Years of education: N/A   Occupational History  . Not on file.   Social History Main Topics  . Smoking status: Never Smoker  . Smokeless tobacco: Never Used  . Alcohol use 0.0 oz/week     Comment: occas  . Drug use: No  . Sexual activity: No   Other Topics Concern  . Not on file   Social History Narrative  . No narrative on file   Family History  Problem Relation Age of Onset  . Cancer Father     larynx cancer  . Diabetes Mother   . Hyperlipidemia Mother   . Cancer Maternal Grandmother   . Diabetes Maternal Grandmother   . Hyperlipidemia Maternal Grandmother   . Hypertension Maternal Grandmother   . Diabetes Maternal Grandfather   . Hyperlipidemia Maternal Grandfather   . Cancer Paternal Grandmother   . Diabetes Paternal Grandmother   . Heart disease Paternal Grandmother   . Hyperlipidemia Paternal Grandmother   . Hypertension Paternal Grandmother   . Diabetes Sister   . Hyperlipidemia Sister   . Colon cancer Neg Hx   . Colon polyps Neg Hx   . Rectal cancer Neg Hx   . Stomach cancer Neg Hx      Review of Systems See HPI  Patient Active Problem List   Diagnosis Date Noted  . Hypertension 01/23/2016  . Right  knee pain 12/11/2014  . Effusion of right knee 12/11/2014  . MIGRAINE HEADACHE 06/22/2007  . SLEEP APNEA 06/22/2007  . LIVER FUNCTION TESTS, ABNORMAL 06/22/2007  . HEPATIC ADENOMA 04/10/2007     Prior to Admission medications   Medication Sig Start Date End Date Taking? Authorizing Provider  hydrochlorothiazide (HYDRODIURIL) 25 MG tablet Take 25 mg by mouth daily.   Yes Historical Provider, MD  levonorgestrel (MIRENA, 52 MG,) 20 MCG/24HR IUD as directed   Yes Historical Provider, MD  No Known Allergies     Objective:  Physical Exam  Constitutional: She is oriented to person, place, and time. She appears well-developed and well-nourished.  HENT:  Head: Normocephalic and atraumatic.  Right Ear: External ear normal.  Left Ear: External ear normal.  Nose: Nose normal.  Eyes: Conjunctivae and EOM are normal. Pupils are equal, round, and reactive to light.  Neck: Normal range of motion. Neck supple.  Cardiovascular: Normal rate.   Pulmonary/Chest: Effort normal and breath sounds normal.  Musculoskeletal: She exhibits tenderness. She exhibits no edema.  Bony tenderness between 4th and 5th digit with palpation. Increased pain with plantar flexion compared to dorsiflexion.   Neurological: She is alert and oriented to person, place, and time.  Skin:  Skin is warm and dry.  Psychiatric: She has a normal mood and affect. Her behavior is normal. Judgment and thought content normal.    Dg Foot Complete Right  Result Date: 02/05/2016 CLINICAL DATA:  50 year old female with pain in the distal fifth toe for 2 weeks with uncertain trauma history. Initial encounter. EXAM: RIGHT FOOT COMPLETE - 3+ VIEW COMPARISON:  None. FINDINGS: Bone mineralization is within normal limits. Calcaneus appears intact with degenerative spurring. Tarsal bone alignment within normal limits. Metatarsals appear intact. Mild first MTP joint space loss subchondral sclerosis and osteophytosis. Phalanges including the fifth  phalanges appear intact. Other distal joint spaces appear normal. Chronic degenerative or posttraumatic ossific fragment at the medial malleolus. IMPRESSION: 1.  No acute osseous abnormality identified in the right foot. 2. Right first MTP osteoarthritis. Chronic posttraumatic or degenerative changes at the medial malleolus. Electronically Signed   By: Genevie Ann M.D.   On: 02/05/2016 13:09   Vitals:   02/05/16 1220  BP: 130/80  Pulse: 83  Resp: 16  Temp: 98.3 F (36.8 C)   Assessment & Plan:  1. Right foot pain, likely associated with inflammation related to chronic osteoarthritis. - DG Foot Complete  Plan: . meloxicam (MOBIC) 15 MG tablet    Sig: Take 1 tablet (15 mg total) by mouth daily.   If pain persists will consider a referral to orthopedics for further evaluation.  Carroll Sage. Kenton Kingfisher, MSN, FNP-C [facility]

## 2016-02-05 NOTE — Patient Instructions (Addendum)
Start Meloxicam 15 mg as needed for foot pain.  If foot pain continues, I will refer you to orthopedics for further evaluations.    IF you received an x-ray today, you will receive an invoice from [facility]. Please contact [facility] at [phone] with questions or concerns regarding your invoice.   IF you received labwork today, you will receive an invoice from [employer]. Please contact [facility] at [phone] with questions or concerns regarding your invoice.   Our billing staff will not be able to assist you with questions regarding bills from these companies.  You will be contacted with the lab results as soon as they are available. The fastest way to get your results is to activate your My Chart account. Instructions are located on the last page of this paperwork. If you have not heard from [place] regarding the results in 2 weeks, please contact this office.

## 2016-02-12 ENCOUNTER — Other Ambulatory Visit: Payer: Self-pay | Admitting: Obstetrics & Gynecology

## 2016-02-12 DIAGNOSIS — Z1231 Encounter for screening mammogram for malignant neoplasm of breast: Secondary | ICD-10-CM

## 2016-02-19 ENCOUNTER — Encounter: Payer: Self-pay | Admitting: Family Medicine

## 2016-02-19 ENCOUNTER — Ambulatory Visit (INDEPENDENT_AMBULATORY_CARE_PROVIDER_SITE_OTHER): Payer: 59 | Admitting: Family Medicine

## 2016-02-19 DIAGNOSIS — M25561 Pain in right knee: Secondary | ICD-10-CM

## 2016-02-19 DIAGNOSIS — M25552 Pain in left hip: Secondary | ICD-10-CM | POA: Diagnosis not present

## 2016-02-19 DIAGNOSIS — G8929 Other chronic pain: Secondary | ICD-10-CM | POA: Diagnosis not present

## 2016-02-19 NOTE — Patient Instructions (Addendum)
Start physical therapy for your knee and your left hip. You can start straight leg raises, knee extensions, hamstring curls, side leg raises, standing hip rotations 3 sets of 10 once a day while waiting on therapy. I wouldn't recommend the gel shots or repeating your cortisone shot for the knee at this time. It's unlikely that a knee brace will make a difference. Follow up with me 4-6 weeks after starting therapy.  The x-rays of your foot do not show anything where you are hurting suggesting metatarsalgia as the cause of pain. Arch supports are the most important part of treatment for this (something like spencos, superfeet, dr. Zoe Lan active series).

## 2016-02-22 DIAGNOSIS — M25552 Pain in left hip: Secondary | ICD-10-CM | POA: Insufficient documentation

## 2016-02-22 NOTE — Assessment & Plan Note (Signed)
possibly due to overuse related to right knee pain, question mild arthritis.  She will start physical therapy for this.  Shown home exercises she could start.  Tylenol, nsaids, glucosamine as noted above.

## 2016-02-22 NOTE — Progress Notes (Signed)
PCP: Aretta Nip, MD  Subjective:   HPI: Patient is a 50 y.o. female here for right knee pain.  9/29: Patient reports she's had 4 years of anterior right knee pain. Recalls showing daughter how to do a roundoff and had some pain in this knee. Since then has had intermittent problems with the knee catching and lockingup. Pain is 2/10 level, dull. She was seen in [place] office and did 6-8 weeks of PT with mild improvement. Tried mobic also and sleeps with a pillow between her legs. Tried icing elevating. Worse with walking. No skin changes, numbness.  10/27: Patient reports she feels pain in right knee has improved. Still getting catching in this knee though. Worse with walking, rolling over in bed.   Pain currently 0/10. Also reporting left hip pain, worse with walking. Requested I look at her foot radiographs also - had pain top of right foot No skin changes, numbness.  Past Medical History:  Diagnosis Date  . Arthritis   . GERD (gastroesophageal reflux disease)   . Hypertension     Current Outpatient Prescriptions on File Prior to Visit  Medication Sig Dispense Refill  . hydrochlorothiazide (HYDRODIURIL) 25 MG tablet Take 25 mg by mouth daily.    Marland Kitchen levonorgestrel (MIRENA, 52 MG,) 20 MCG/24HR IUD as directed    . meloxicam (MOBIC) 15 MG tablet Take 1 tablet (15 mg total) by mouth daily. 30 tablet 1   No current facility-administered medications on file prior to visit.     No past surgical history on file.  No Known Allergies  Social History   Social History  . Marital status: Married    Spouse name: N/A  . Number of children: N/A  . Years of education: N/A   Occupational History  . Not on file.   Social History Main Topics  . Smoking status: Never Smoker  . Smokeless tobacco: Never Used  . Alcohol use 0.0 oz/week     Comment: occas  . Drug use: No  . Sexual activity: No   Other Topics Concern  . Not on file   Social History Narrative  .  No narrative on file    Family History  Problem Relation Age of Onset  . Cancer Father     larynx cancer  . Diabetes Mother   . Hyperlipidemia Mother   . Cancer Maternal Grandmother   . Diabetes Maternal Grandmother   . Hyperlipidemia Maternal Grandmother   . Hypertension Maternal Grandmother   . Diabetes Maternal Grandfather   . Hyperlipidemia Maternal Grandfather   . Cancer Paternal Grandmother   . Diabetes Paternal Grandmother   . Heart disease Paternal Grandmother   . Hyperlipidemia Paternal Grandmother   . Hypertension Paternal Grandmother   . Diabetes Sister   . Hyperlipidemia Sister   . Colon cancer Neg Hx   . Colon polyps Neg Hx   . Rectal cancer Neg Hx   . Stomach cancer Neg Hx     BP (!) 148/97   Pulse 75   Ht 5\' 4"  (1.626 m)   Wt 193 lb (87.5 kg)   BMI 33.13 kg/m   Review of Systems: See HPI above.    Objective:  Physical Exam:  Gen: NAD, comfortable in exam room  Right knee: No gross deformity, ecchymoses, swelling. Mild TTP post patellar facets and medial joint line. FROM. Negative ant/post drawers. Negative valgus/varus testing. Negative lachmanns. Negative mcmurrays, apleys, patellar apprehension. NV intact distally.    Right hip: No gross deformity,  swelling, bruising. No trochanteric tenderness.  Mild TTP hip external rotators. Weakness with hip abduction. FROM. NVI distally.  Assessment & Plan:  1. Right knee pain - patient had MRI last year which did not show a meniscus tear but degenerative changes most prominent in patellofemoral compartment.  Only mild improvement following cortisone injection.  She would like to restart physical therapy again - referral sent.  We discussed tylenol, other nsaids, glucosamine, topical medications.  Consider viscosupplementation.  F/u in 4-6 weeks for reevaluation  2. Left hip pain - possibly due to overuse related to right knee pain, question mild arthritis.  She will start physical therapy for this.   Shown home exercises she could start.  Tylenol, nsaids, glucosamine as noted above.

## 2016-02-22 NOTE — Assessment & Plan Note (Signed)
patient had MRI last year which did not show a meniscus tear but degenerative changes most prominent in patellofemoral compartment.  Only mild improvement following cortisone injection.  She would like to restart physical therapy again - referral sent.  We discussed tylenol, other nsaids, glucosamine, topical medications.  Consider viscosupplementation.  F/u in 4-6 weeks for reevaluation

## 2016-03-11 ENCOUNTER — Ambulatory Visit
Admission: RE | Admit: 2016-03-11 | Discharge: 2016-03-11 | Disposition: A | Discharge status: Home / Self Care | Payer: 59 | Source: Ambulatory Visit | Attending: Obstetrics & Gynecology | Admitting: Obstetrics & Gynecology

## 2016-03-11 DIAGNOSIS — Z1231 Encounter for screening mammogram for malignant neoplasm of breast: Secondary | ICD-10-CM

## 2016-03-15 MED FILL — AZITHROMYCIN 250 MG TABLET: 250 | 5 days supply | Qty: 6 | Fill #0

## 2016-03-15 MED FILL — VENTOLIN HFA 90 MCG INHALER: 108 (90 BAS | 25 days supply | Qty: 18 | Fill #0

## 2016-04-11 ENCOUNTER — Other Ambulatory Visit: Payer: Self-pay | Admitting: Obstetrics & Gynecology

## 2016-04-11 MED FILL — PHENTERMINE 37.5 MG TABLET: 37.5 | 30 days supply | Qty: 30 | Fill #0

## 2016-04-12 LAB — CYTOLOGY - PAP

## 2016-05-17 MED FILL — PHENTERMINE 37.5 MG TABLET: 37.5 | 30 days supply | Qty: 30 | Fill #1

## 2016-06-01 ENCOUNTER — Ambulatory Visit (INDEPENDENT_AMBULATORY_CARE_PROVIDER_SITE_OTHER): Payer: 59 | Admitting: Family Medicine

## 2016-06-01 VITALS — BP 124/84 | HR 91 | Temp 98.1°F | Resp 16 | Ht 63.5 in | Wt 192.0 lb

## 2016-06-01 DIAGNOSIS — H6981 Other specified disorders of Eustachian tube, right ear: Secondary | ICD-10-CM

## 2016-06-01 DIAGNOSIS — J0100 Acute maxillary sinusitis, unspecified: Secondary | ICD-10-CM | POA: Diagnosis not present

## 2016-06-01 MED ORDER — AMOXICILLIN-POT CLAVULANATE 500-125 MG PO TABS: 1.0000 | ORAL_TABLET | Freq: Two times a day (BID) | ORAL | 0 refills | Status: DC

## 2016-06-01 MED ORDER — TRIAMCINOLONE ACETONIDE 0.1 % EX CREA: 1.0000 "application " | TOPICAL_CREAM | Freq: Two times a day (BID) | CUTANEOUS | 0 refills | Status: DC

## 2016-06-01 MED FILL — AMOX-CLAV 500-125 MG TABLET: 500-125 | 10 days supply | Qty: 20 | Fill #0

## 2016-06-01 MED FILL — TRIAMCINOLONE 0.1% CREAM: 0.1 | 10 days supply | Qty: 30 | Fill #0

## 2016-06-01 NOTE — Patient Instructions (Addendum)
You have a sinus infection.  We are going to treat this with Augmentin  This is causing the dysfunction of your eustachian tube which is what is causing the ear pain.  If you're still having pain 2 weeks now please let us know. At that point may need to send you to an ENT.  It was good to meet you today    Eustachian Tube Dysfunction Introduction The eustachian tube connects the middle ear to the back of the nose. It regulates air pressure in the middle ear by allowing air to move between the ear and nose. It also helps to drain fluid from the middle ear space. When the eustachian tube does not function properly, air pressure, fluid, or both can build up in the middle ear. Eustachian tube dysfunction can affect one or both ears. What are the causes? This condition happens when the eustachian tube becomes blocked or cannot open normally. This may result from:  Ear infections.  Colds and other upper respiratory infections.  Allergies.  Irritation, such as from cigarette smoke or acid from the stomach coming up into the esophagus (gastroesophageal reflux).  Sudden changes in air pressure, such as from descending in an airplane.  Abnormal growths in the nose or throat, such as nasal polyps, tumors, or enlarged tissue at the back of the throat (adenoids). What increases the risk? This condition may be more likely to develop in people who smoke and people who are overweight. Eustachian tube dysfunction may also be more likely to develop in children, especially children who have:  Certain birth defects of the mouth, such as cleft palate.  Large tonsils and adenoids. What are the signs or symptoms? Symptoms of this condition may include:  A feeling of fullness in the ear.  Ear pain.  Clicking or popping noises in the ear.  Ringing in the ear.  Hearing loss.  Loss of balance. Symptoms may get worse when the air pressure around you changes, such as when you travel to an area  of high elevation or fly on an airplane. How is this diagnosed? This condition may be diagnosed based on:  Your symptoms.  A physical exam of your ear, nose, and throat.  Tests, such as those that measure:  The movement of your eardrum (tympanogram).  Your hearing (audiometry). How is this treated? Treatment depends on the cause and severity of your condition. If your symptoms are mild, you may be able to relieve your symptoms by moving air into ("popping") your ears. If you have symptoms of fluid in your ears, treatment may include:  Decongestants.  Antihistamines.  Nasal sprays or ear drops that contain medicines that reduce swelling (steroids). In some cases, you may need to have a procedure to drain the fluid in your eardrum (myringotomy). In this procedure, a small tube is placed in the eardrum to:  Drain the fluid.  Restore the air in the middle ear space. Follow these instructions at home:  Take over-the-counter and prescription medicines only as told by your health care provider.  Use techniques to help pop your ears as recommended by your health care provider. These may include:  Chewing gum.  Yawning.  Frequent, forceful swallowing.  Closing your mouth, holding your nose closed, and gently blowing as if you are trying to blow air out of your nose.  Do not do any of the following until your health care provider approves:  Travel to high altitudes.  Fly in airplanes.  Work in a Pension scheme manager  or room.  Scuba dive.  Keep your ears dry. Dry your ears completely after showering or bathing.  Do not smoke.  Keep all follow-up visits as told by your health care provider. This is important. Contact a health care provider if:  Your symptoms do not go away after treatment.  Your symptoms come back after treatment.  You are unable to pop your ears.  You have:  A fever.  Pain in your ear.  Pain in your head or neck.  Fluid draining from your  ear.  Your hearing suddenly changes.  You become very dizzy.  You lose your balance. This information is not intended to replace advice given to you by your health care provider. Make sure you discuss any questions you have with your health care provider. Document Released: 05/08/2015 Document Revised: 09/17/2015 Document Reviewed: 04/30/2014  2017 Elsevier    IF you received an x-ray today, you will receive an invoice from [facility]. Please contact [facility] at [phone] with questions or concerns regarding your invoice.   IF you received labwork today, you will receive an invoice from [facility]. Please contact [facility] at [phone] with questions or concerns regarding your invoice.   Our billing staff will not be able to assist you with questions regarding bills from these companies.  You will be contacted with the lab results as soon as they are available. The fastest way to get your results is to activate your My Chart account. Instructions are located on the last page of this paperwork. If you have not heard from [place] regarding the results in 2 weeks, please contact this office.

## 2016-06-01 NOTE — Progress Notes (Signed)
   SUBJECTIVE: URI symptoms:  [name] [name] is a 51 y.o. female who complains of URI symptoms present for past several weeks.  Describes rhinorrhea, sinus congestion.  No cough..  Has tried OTC meds without relief.  Sick contacts are none that she knows of.  No fevers or chills. No nausea or vomiting.  Denies smoking cigarettes.  Has had sharp stabbing pain in her right ear for several weeks as well. She has been using eardrops without any relief of this. No ear drainage. No dizziness. No nausea vomiting.  ROS as above.    PMH reviewed. Patient is a nonsmoker.   Medications reviewed.  Physical Exam:  BP 124/84   Pulse 91   Temp 98.1 F (36.7 C) (Oral)   Resp 16   Ht 5' 3.5" (1.613 m)   Wt 192 lb (87.1 kg)   SpO2 94%   BMI 33.48 kg/m  Gen:  Patient sitting on exam table, appears stated age in no acute distress Head: Normocephalic atraumatic Eyes: EOMI, PERRL, sclera and conjunctiva non-erythematous Ears:  Canals clear bilaterally.  TM on left is normal. TM on right with fluid behind the ear. Nose: Tender to maxillary sinuses bilaterally, worse on the right. Mouth: Mucosa membranes moist. Tonsils +2, nonenlarged, minimally erythematous. No exudates. Neck: No cervical lymphadenopathy noted Heart:  RRR, no murmurs auscultated. Pulm:  Clear to auscultation bilaterally with good air movement.  No wheezes or rales noted.    Assessment and Plan:  1.  Sinusitis: -Treat with Augmentin. -Has been an ongoing issue with past several weeks. -Sounds like she has 1-2 sinus infections per year. -If she starts having more often should probably send to ENT for further evaluation.  #2. Eustachian tube dysfunction: -Secondary to #1 on above. -Should resolve or at least improve with improvement of her sinusitis. -If persists longer than that can consider ENT referral.

## 2016-06-13 DIAGNOSIS — I10 Essential (primary) hypertension: Secondary | ICD-10-CM | POA: Diagnosis not present

## 2016-06-13 DIAGNOSIS — Z0000 Encounter for general adult medical examination without abnormal findings: Secondary | ICD-10-CM | POA: Diagnosis not present

## 2016-06-13 LAB — BASIC METABOLIC PANEL
BUN: 13 (ref 4–21)
Creatinine: 0.8 (ref 0.5–1.1)
Glucose: 94
Potassium: 3.7 (ref 3.4–5.3)
Sodium: 138 (ref 137–147)

## 2016-06-13 LAB — LIPID PANEL
CHOLESTEROL: 184 (ref 0–200)
HDL: 49 (ref 35–70)
LDL Cholesterol: 116
Triglycerides: 93 (ref 40–160)

## 2016-06-13 LAB — HEPATIC FUNCTION PANEL
ALK PHOS: 74 (ref 25–125)
ALT: 44 — AB (ref 7–35)
AST: 34 (ref 13–35)
Bilirubin, Total: 1

## 2016-06-13 MED FILL — AMOX TR-K CLV 875-125 MG TA: 875-125 | 7 days supply | Qty: 14 | Fill #0

## 2016-07-01 MED FILL — PHENTERMINE 37.5 MG TABLET: 37.5 | 30 days supply | Qty: 30 | Fill #2

## 2016-07-06 DIAGNOSIS — H9311 Tinnitus, right ear: Secondary | ICD-10-CM | POA: Diagnosis not present

## 2016-07-06 DIAGNOSIS — J324 Chronic pansinusitis: Secondary | ICD-10-CM | POA: Insufficient documentation

## 2016-07-06 DIAGNOSIS — J342 Deviated nasal septum: Secondary | ICD-10-CM | POA: Diagnosis not present

## 2016-07-06 MED FILL — CLINDAMYCIN HCL 300 MG CAP: 300 | 14 days supply | Qty: 42 | Fill #0

## 2016-07-28 DIAGNOSIS — H9311 Tinnitus, right ear: Secondary | ICD-10-CM | POA: Diagnosis not present

## 2016-07-28 DIAGNOSIS — J324 Chronic pansinusitis: Secondary | ICD-10-CM | POA: Diagnosis not present

## 2016-07-28 MED FILL — DOXYCYCLINE HYCLATE 100 MG: 100 | 10 days supply | Qty: 20 | Fill #0

## 2016-09-05 MED FILL — ESTRADIOL 0.5 MG TABLET: 0.5 | 30 days supply | Qty: 30 | Fill #0

## 2016-09-05 MED FILL — ZOLPIDEM TARTRATE 5 MG TAB: 5 | 30 days supply | Qty: 30 | Fill #0

## 2016-09-05 MED FILL — PHENTERMINE 37.5 MG TABLET: 37.5 | 30 days supply | Qty: 30 | Fill #0

## 2016-09-28 DIAGNOSIS — R002 Palpitations: Secondary | ICD-10-CM | POA: Diagnosis not present

## 2016-10-13 MED FILL — PHENTERMINE 37.5 MG TABLET: 37.5 | 30 days supply | Qty: 30 | Fill #1

## 2016-11-18 MED FILL — PHENTERMINE 37.5 MG TABLET: 37.5 | 30 days supply | Qty: 30 | Fill #2

## 2017-01-03 DIAGNOSIS — G8929 Other chronic pain: Secondary | ICD-10-CM | POA: Diagnosis not present

## 2017-01-03 DIAGNOSIS — M25561 Pain in right knee: Secondary | ICD-10-CM | POA: Diagnosis not present

## 2017-01-23 HISTORY — PX: KNEE ARTHROSCOPY: SUR90

## 2017-02-06 ENCOUNTER — Other Ambulatory Visit: Payer: Self-pay | Admitting: Obstetrics & Gynecology

## 2017-02-06 DIAGNOSIS — Z1231 Encounter for screening mammogram for malignant neoplasm of breast: Secondary | ICD-10-CM

## 2017-02-22 DIAGNOSIS — S83241A Other tear of medial meniscus, current injury, right knee, initial encounter: Secondary | ICD-10-CM | POA: Diagnosis not present

## 2017-02-22 DIAGNOSIS — M659 Synovitis and tenosynovitis, unspecified: Secondary | ICD-10-CM | POA: Diagnosis not present

## 2017-02-22 DIAGNOSIS — G8918 Other acute postprocedural pain: Secondary | ICD-10-CM | POA: Diagnosis not present

## 2017-02-22 DIAGNOSIS — M2241 Chondromalacia patellae, right knee: Secondary | ICD-10-CM | POA: Diagnosis not present

## 2017-02-22 DIAGNOSIS — M94261 Chondromalacia, right knee: Secondary | ICD-10-CM | POA: Diagnosis not present

## 2017-02-22 DIAGNOSIS — S83221A Peripheral tear of medial meniscus, current injury, right knee, initial encounter: Secondary | ICD-10-CM | POA: Diagnosis not present

## 2017-02-22 DIAGNOSIS — Z9889 Other specified postprocedural states: Secondary | ICD-10-CM | POA: Insufficient documentation

## 2017-03-13 ENCOUNTER — Ambulatory Visit
Admission: RE | Admit: 2017-03-13 | Discharge: 2017-03-13 | Disposition: A | Discharge status: Home / Self Care | Payer: 59 | Source: Ambulatory Visit | Attending: Obstetrics & Gynecology | Admitting: Obstetrics & Gynecology

## 2017-03-13 DIAGNOSIS — Z1231 Encounter for screening mammogram for malignant neoplasm of breast: Secondary | ICD-10-CM

## 2017-03-30 DIAGNOSIS — M19042 Primary osteoarthritis, left hand: Secondary | ICD-10-CM | POA: Diagnosis not present

## 2017-04-24 DIAGNOSIS — Z01419 Encounter for gynecological examination (general) (routine) without abnormal findings: Secondary | ICD-10-CM | POA: Diagnosis not present

## 2017-04-24 DIAGNOSIS — Z0000 Encounter for general adult medical examination without abnormal findings: Secondary | ICD-10-CM | POA: Diagnosis not present

## 2017-04-24 MED FILL — AZITHROMYCIN 250 MG TAB: 250 | 5 days supply | Qty: 6 | Fill #0

## 2017-04-24 MED FILL — PHENTERMINE 37.5 MG TABLET: 37.5 | 30 days supply | Qty: 30 | Fill #0

## 2017-04-24 MED FILL — ESTRADIOL 10 MCG TABS: 10 | 28 days supply | Qty: 8 | Fill #0

## 2017-05-02 DIAGNOSIS — M25561 Pain in right knee: Secondary | ICD-10-CM | POA: Diagnosis not present

## 2017-06-21 MED FILL — PHENTERMINE 37.5 MG TABLET: 37.5 | 30 days supply | Qty: 30 | Fill #1

## 2017-08-09 DIAGNOSIS — M25461 Effusion, right knee: Secondary | ICD-10-CM | POA: Diagnosis not present

## 2017-08-09 DIAGNOSIS — M1711 Unilateral primary osteoarthritis, right knee: Secondary | ICD-10-CM | POA: Diagnosis not present

## 2017-08-15 ENCOUNTER — Encounter: Payer: Self-pay | Admitting: Surgical

## 2017-08-20 NOTE — Progress Notes (Signed)
[name] [name] is a 52 y.o. female is here TO ESTABLISH CARE.  History of Present Illness:   [name] [name] CMA acting as scribe for Dr. Juleen China.  HPI: Patient comes in today to Establish care. She has a couple concerns today.   Knee Swelling: Patient stated that she had surgery October 2018 on the right knee. Since the surgery she has had swelling above the knee. She has had fluid drawn off he knee twice since the surgery. Patient stated that she doe stake Ibuprofen for the swelling, but this does not help.   Chronic sinus: Patient states that she has chronic sinus infections.   Ear Ringing: Patient stated that she has had ringing in her right ear. She has had this since December 2018. This happened after a series of chronic sinus infections.   Menopause: Patient had been taking the estroven for her menopause symptoms. When she was on the estroven for 3 months she started having the ring ing in her ear.   Mirena: Patient has a mirena placed. It is due to come out next year.   Weight: Patient states that she is having trouble loosing weight due to being so busy. She declines the [facility] referral today.    There are no preventive care reminders to display for this patient. Depression screen [place] Day 2/9 08/21/2017 06/01/2016 02/05/2016  Decreased Interest 0 0 0  Down, Depressed, Hopeless 0 0 0  PHQ - 2 Score 0 0 0   PMHx, SurgHx, SocialHx, FamHx, Medications, and Allergies were reviewed in the Visit Navigator and updated as appropriate.   Patient Active Problem List   Diagnosis Date Noted  . Left hip pain 02/22/2016  . Hypertension 01/23/2016  . Right knee pain 12/11/2014  . Effusion of right knee 12/11/2014  . MIGRAINE HEADACHE 06/22/2007  . SLEEP APNEA 06/22/2007  . LIVER FUNCTION TESTS, ABNORMAL 06/22/2007  . HEPATIC ADENOMA 04/10/2007   Social History   Tobacco Use  . Smoking status: Never Smoker  . Smokeless tobacco: Never Used  Substance Use Topics  . Alcohol use: Yes      Alcohol/week: 0.0 oz    Comment: occas  . Drug use: No   Current Medications and Allergies:   .  fluticasone (FLONASE) 50 MCG/ACT nasal spray, Place 2 sprays into both nostrils daily., Disp: , Rfl:  .  hydrochlorothiazide (HYDRODIURIL) 25 MG tablet, Take 1 tablet (25 mg total) by mouth daily., Disp: 90 tablet, Rfl: 3 .  levonorgestrel (MIRENA, 52 MG,) 20 MCG/24HR IUD, as directed, Disp: , Rfl:  .  ranitidine (ZANTAC) 150 MG capsule, 1 capsule, Disp: , Rfl:   No Known Allergies   Review of Systems   Pertinent items are noted in the HPI. Otherwise, ROS is negative.  Vitals:   Vitals:   08/21/17 0828  BP: 124/86  Pulse: 87  Temp: 98.4 F (36.9 C)  TempSrc: Oral  SpO2: 97%  Weight: 196 lb 12.8 oz (89.3 kg)  Height: 5' 3.5" (1.613 m)     Body mass index is 34.31 kg/m.   Physical Exam:   Physical Exam  Constitutional: She is oriented to person, place, and time. She appears well-developed and well-nourished. No distress.  HENT:  Head: Normocephalic and atraumatic.  Right Ear: External ear normal.  Left Ear: External ear normal.  Nose: Nose normal.  Mouth/Throat: Oropharynx is clear and moist.  Eyes: Pupils are equal, round, and reactive to light. Conjunctivae and EOM are normal.  Neck: Normal range of motion.  Neck supple. No thyromegaly present.  Cardiovascular: Normal rate, regular rhythm, normal heart sounds and intact distal pulses.  Pulmonary/Chest: Effort normal and breath sounds normal.  Abdominal: Soft. Bowel sounds are normal.  Musculoskeletal: Normal range of motion.  Lymphadenopathy:    She has no cervical adenopathy.  Neurological: She is alert and oriented to person, place, and time.  Skin: Skin is warm and dry. Capillary refill takes less than 2 seconds.  Psychiatric: She has a normal mood and affect. Her behavior is normal.  Nursing note and vitals reviewed.   Assessment and Plan:   [name] [name] was seen today for establish care.  Diagnoses and all  orders for this visit:  Routine physical examination  Patient Counseling: [x]    Nutrition: Stressed importance of moderation in sodium/caffeine intake, saturated fat and cholesterol, caloric balance, sufficient intake of fresh fruits, vegetables, fiber, calcium, iron, and 1 mg of folate supplement per day (for females capable of pregnancy).  [x]    Stressed the importance of regular exercise.   [x]    Substance Abuse: Discussed cessation/primary prevention of tobacco, alcohol, or other drug use; driving or other dangerous activities under the influence; availability of treatment for abuse.   [x]    Injury prevention: Discussed safety belts, safety helmets, smoke detector, smoking near bedding or upholstery.   [x]    Sexuality: Discussed sexually transmitted diseases, partner selection, use of condoms, avoidance of unintended pregnancy  and contraceptive alternatives.  [x]    Dental health: Discussed importance of regular tooth brushing, flossing, and dental visits.  [x]    Health maintenance and immunizations reviewed. Please refer to Health maintenance section.   Screening for lipid disorders -     Lipid panel  Essential hypertension -     hydrochlorothiazide (HYDRODIURIL) 25 MG tablet; Take 1 tablet (25 mg total) by mouth daily. -     CBC with Differential/Platelet -     Comprehensive metabolic panel  Chronic maxillary sinusitis -     CT Maxillofacial WO CM; Future  Morbid obesity ([place]) -     CBC with Differential/Platelet -     Comprehensive metabolic panel -     Vitamin B12 -     Hemoglobin A1c  Vitamin D deficiency -     VITAMIN D 25 Hydroxy (Vit-D Deficiency, Fractures)   . Reviewed expectations re: course of current medical issues. . Discussed self-management of symptoms. . Outlined signs and symptoms indicating need for more acute intervention. . Patient verbalized understanding and all questions were answered. Marland Kitchen Health Maintenance issues including appropriate healthy diet,  exercise, and smoking avoidance were discussed with patient. . See orders for this visit as documented in the electronic medical record. . Patient received an After Visit Summary.  Briscoe Deutscher, DO [place] 08/21/2017  Future Appointments  Date Time Provider [place]  11/20/2017  8:20 AM Briscoe Deutscher, DO [facility]

## 2017-08-21 ENCOUNTER — Encounter: Payer: Self-pay | Admitting: Family Medicine

## 2017-08-21 ENCOUNTER — Ambulatory Visit: Payer: 59 | Admitting: Family Medicine

## 2017-08-21 VITALS — BP 124/86 | HR 87 | Temp 98.4°F | Ht 63.5 in | Wt 196.8 lb

## 2017-08-21 DIAGNOSIS — J320 Chronic maxillary sinusitis: Secondary | ICD-10-CM

## 2017-08-21 DIAGNOSIS — I10 Essential (primary) hypertension: Secondary | ICD-10-CM | POA: Diagnosis not present

## 2017-08-21 DIAGNOSIS — Z0000 Encounter for general adult medical examination without abnormal findings: Secondary | ICD-10-CM

## 2017-08-21 DIAGNOSIS — E559 Vitamin D deficiency, unspecified: Secondary | ICD-10-CM | POA: Diagnosis not present

## 2017-08-21 DIAGNOSIS — Z13220 Encounter for screening for lipoid disorders: Secondary | ICD-10-CM

## 2017-08-21 LAB — COMPREHENSIVE METABOLIC PANEL
ALT: 27 U/L (ref 0–35)
AST: 21 U/L (ref 0–37)
Albumin: 4 g/dL (ref 3.5–5.2)
Alkaline Phosphatase: 81 U/L (ref 39–117)
BUN: 19 mg/dL (ref 6–23)
CO2: 32 mEq/L (ref 19–32)
Calcium: 9.5 mg/dL (ref 8.4–10.5)
Chloride: 100 mEq/L (ref 96–112)
Creatinine, Ser: 0.77 mg/dL (ref 0.40–1.20)
GFR: 83.25 mL/min (ref >60.00)
Glucose, Bld: 86 mg/dL (ref 70–99)
Potassium: 3.9 mEq/L (ref 3.5–5.1)
Sodium: 140 mEq/L (ref 135–145)
Total Bilirubin: 1 mg/dL (ref 0.2–1.2)
Total Protein: 7.5 g/dL (ref 6.0–8.3)

## 2017-08-21 LAB — CBC WITH DIFFERENTIAL/PLATELET
Basophils Absolute: 0.1 10*3/uL (ref 0.0–0.1)
Basophils Relative: 0.8 % (ref 0.0–3.0)
Eosinophils Absolute: 0.1 10*3/uL (ref 0.0–0.7)
Eosinophils Relative: 1.4 % (ref 0.0–5.0)
HCT: 41 % (ref 36.0–46.0)
Hemoglobin: 14.1 g/dL (ref 12.0–15.0)
Lymphocytes Relative: 27.2 % (ref 12.0–46.0)
Lymphs Abs: 2.4 10*3/uL (ref 0.7–4.0)
MCHC: 34.5 g/dL (ref 30.0–36.0)
MCV: 90.2 fl (ref 78.0–100.0)
Monocytes Absolute: 0.8 10*3/uL (ref 0.1–1.0)
Monocytes Relative: 8.7 % (ref 3.0–12.0)
Neutro Abs: 5.5 10*3/uL (ref 1.4–7.7)
Neutrophils Relative %: 61.9 % (ref 43.0–77.0)
Platelets: 263 10*3/uL (ref 150.0–400.0)
RBC: 4.55 Mil/uL (ref 3.87–5.11)
RDW: 12.5 % (ref 11.5–15.5)
WBC: 8.9 10*3/uL (ref 4.0–10.5)

## 2017-08-21 LAB — LIPID PANEL
Cholesterol: 176 mg/dL (ref 0–200)
HDL: 61.4 mg/dL (ref >39.00)
LDL Cholesterol: 95 mg/dL (ref 0–99)
NonHDL: 115
Total CHOL/HDL Ratio: 3
Triglycerides: 100 mg/dL (ref 0.0–149.0)
VLDL: 20 mg/dL (ref 0.0–40.0)

## 2017-08-21 LAB — VITAMIN D 25 HYDROXY (VIT D DEFICIENCY, FRACTURES): VITD: 18.3 ng/mL — ABNORMAL LOW (ref 30.00–100.00)

## 2017-08-21 LAB — HEMOGLOBIN A1C: Hgb A1c MFr Bld: 6 % (ref 4.6–6.5)

## 2017-08-21 LAB — VITAMIN B12: Vitamin B-12: 140 pg/mL — ABNORMAL LOW (ref 211–911)

## 2017-08-21 MED ORDER — HYDROCHLOROTHIAZIDE 25 MG PO TABS: 25.0000 mg | ORAL_TABLET | Freq: Every day | ORAL | 3 refills | Status: DC

## 2017-08-26 MED ORDER — CHOLECALCIFEROL 1.25 MG (50000 UT) PO TABS: ORAL_TABLET | ORAL | 0 refills | Status: DC

## 2017-08-26 NOTE — Addendum Note (Signed)
Addended by: Briscoe Deutscher R on: 08/26/2017 03:37 PM   Modules accepted: Orders

## 2017-08-28 ENCOUNTER — Ambulatory Visit (INDEPENDENT_AMBULATORY_CARE_PROVIDER_SITE_OTHER)
Admission: RE | Admit: 2017-08-28 | Discharge: 2017-08-28 | Disposition: A | Discharge status: Home / Self Care | Payer: 59 | Source: Ambulatory Visit | Attending: Family Medicine | Admitting: Family Medicine

## 2017-08-28 ENCOUNTER — Encounter: Payer: Self-pay | Admitting: *Deleted

## 2017-08-28 DIAGNOSIS — J320 Chronic maxillary sinusitis: Secondary | ICD-10-CM

## 2017-08-28 DIAGNOSIS — J329 Chronic sinusitis, unspecified: Secondary | ICD-10-CM | POA: Diagnosis not present

## 2017-09-01 ENCOUNTER — Encounter: Payer: Self-pay | Admitting: Family Medicine

## 2017-09-01 ENCOUNTER — Telehealth: Payer: Self-pay | Admitting: Family Medicine

## 2017-09-01 NOTE — Telephone Encounter (Signed)
This encounter was created in error - please disregard.

## 2017-09-01 NOTE — Telephone Encounter (Signed)
Copied from [place] [id number]. Topic: Quick Communication - Lab Results >> Sep 01, 2017  8:23 AM Carolyn Stare wrote:  Pt returning call about lab results   [phone]

## 2017-09-01 NOTE — Telephone Encounter (Signed)
I have scheduled the patient for his first B12 injection on Monday.

## 2017-09-01 NOTE — Telephone Encounter (Signed)
Left message for patient to return call. She would not be charged a co pay for the nurse visit where she would get the injections. She would have to check with her insurance to see if they will cover a 100%. We can't guarantee because of it depending on the insurance plan the patient has.

## 2017-09-01 NOTE — Telephone Encounter (Signed)
Pt given lab results per notes of Dr. Juleen China on 08/26/17. Pt verbalized understanding. She asks for someone to call her today to discuss the Vitamin B12 injections. She asks "does insurance cover or will she have to pay a co-pay each time she comes into the office and will she be receiving them in the office? These are the questions I have." I advised this would be sent to the provider and someone will call with the answers.

## 2017-09-01 NOTE — Telephone Encounter (Signed)
Left message. Return call for lab results.

## 2017-09-01 NOTE — Telephone Encounter (Signed)
See note

## 2017-09-04 ENCOUNTER — Ambulatory Visit (INDEPENDENT_AMBULATORY_CARE_PROVIDER_SITE_OTHER): Payer: 59

## 2017-09-04 DIAGNOSIS — E538 Deficiency of other specified B group vitamins: Secondary | ICD-10-CM

## 2017-09-04 MED ORDER — CYANOCOBALAMIN 1000 MCG/ML IJ SOLN
1000.0000 ug | Freq: Once | INTRAMUSCULAR | Status: AC
  Administered 2017-09-04: 1000 ug via INTRAMUSCULAR

## 2017-09-04 MED ORDER — CYANOCOBALAMIN 1000 MCG/ML IJ SOLN: 1000.0000 ug | Freq: Once | INTRAMUSCULAR | Status: DC

## 2017-09-04 NOTE — Progress Notes (Signed)
Patient received vitamin B12 1000 mcg in right deltoid.  Tolerated without difficulty.  Will schedule B12 injection in 1 week before leaving today.

## 2017-09-11 ENCOUNTER — Ambulatory Visit (INDEPENDENT_AMBULATORY_CARE_PROVIDER_SITE_OTHER): Payer: 59

## 2017-09-11 ENCOUNTER — Ambulatory Visit: Payer: 59

## 2017-09-11 DIAGNOSIS — E538 Deficiency of other specified B group vitamins: Secondary | ICD-10-CM | POA: Diagnosis not present

## 2017-09-11 MED ORDER — CYANOCOBALAMIN 1000 MCG/ML IJ SOLN
1000.0000 ug | Freq: Once | INTRAMUSCULAR | Status: AC
  Administered 2017-09-11: 1000 ug via INTRAMUSCULAR

## 2017-09-11 NOTE — Progress Notes (Signed)
Patient received vitamin B12 1000 mcg IM in right deltoid.  Tolerated without difficulty.  Will schedule another vitamin B12 injection in 1 week.

## 2017-09-15 ENCOUNTER — Encounter: Payer: Self-pay | Admitting: Family Medicine

## 2017-09-19 ENCOUNTER — Ambulatory Visit: Payer: 59

## 2017-09-24 ENCOUNTER — Encounter: Payer: Self-pay | Admitting: Family Medicine

## 2017-09-28 DIAGNOSIS — M1711 Unilateral primary osteoarthritis, right knee: Secondary | ICD-10-CM | POA: Diagnosis not present

## 2017-09-28 DIAGNOSIS — M1712 Unilateral primary osteoarthritis, left knee: Secondary | ICD-10-CM | POA: Diagnosis not present

## 2017-11-15 ENCOUNTER — Ambulatory Visit: Payer: 59 | Admitting: Family Medicine

## 2017-11-15 VITALS — BP 136/88 | HR 77 | Temp 98.0°F | Ht 63.5 in | Wt 198.2 lb

## 2017-11-15 DIAGNOSIS — E538 Deficiency of other specified B group vitamins: Secondary | ICD-10-CM | POA: Diagnosis not present

## 2017-11-15 DIAGNOSIS — J0100 Acute maxillary sinusitis, unspecified: Secondary | ICD-10-CM | POA: Diagnosis not present

## 2017-11-15 MED ORDER — CYANOCOBALAMIN 1000 MCG/ML IJ SOLN: INTRAMUSCULAR | 12 refills | Status: DC

## 2017-11-15 MED ORDER — CYANOCOBALAMIN 1000 MCG/ML IJ SOLN
1000.0000 ug | Freq: Once | INTRAMUSCULAR | Status: AC
  Administered 2017-11-15: 1000 ug via INTRAMUSCULAR

## 2017-11-15 MED ORDER — PREDNISONE 5 MG PO TABS: ORAL_TABLET | ORAL | 0 refills | Status: DC

## 2017-11-15 MED ORDER — AMOXICILLIN 875 MG PO TABS: 875.0000 mg | ORAL_TABLET | Freq: Two times a day (BID) | ORAL | 0 refills | Status: DC

## 2017-11-15 MED ORDER — GUAIFENESIN-CODEINE 100-10 MG/5ML PO SYRP: 10.0000 mL | ORAL_SOLUTION | Freq: Every evening | ORAL | 0 refills | Status: DC | PRN

## 2017-11-15 MED ORDER — "SYRINGE 25G X 1"" 3 ML MISC": 1.0000 "application " | 0 refills | Status: DC

## 2017-11-15 NOTE — Progress Notes (Signed)
[name] [name] is a 52 y.o. female here for an acute visit.  History of Present Illness:   Lonell Grandchild, CMA acting as scribe for Dr. Briscoe Deutscher.   HPI: Patient in office for evaluation of sinus pressure. Started about six days ago. Has had some pain in right ear. She has had some nasal drainage, head ache, nausea and vertigo that has been increasing as time goes on. She has tried sudafed and tylenol with no improvement.    PMHx, SurgHx, SocialHx, Medications, and Allergies were reviewed in the Visit Navigator and updated as appropriate.  Current Medications:    .  Cholecalciferol 50000 units TABS, 50,000 units PO qwk for 12 weeks., Disp: 12 tablet, Rfl: 0 .  fluticasone (FLONASE) 50 MCG/ACT nasal spray, Place 2 sprays into both nostrils daily., Disp: , Rfl:  .  hydrochlorothiazide (HYDRODIURIL) 25 MG tablet, Take 1 tablet (25 mg total) by mouth daily., Disp: 90 tablet, Rfl: 3 .  levonorgestrel (MIRENA, 52 MG,) 20 MCG/24HR IUD, as directed, Disp: , Rfl:  .  ranitidine (ZANTAC) 150 MG capsule, 1 capsule, Disp: , Rfl:    No Known Allergies   Review of Systems:   Pertinent items are noted in the HPI. Otherwise, ROS is negative.  Vitals:   Vitals:   11/15/17 1310  BP: 136/88  Pulse: 77  Temp: 98 F (36.7 C)  TempSrc: Oral  SpO2: 96%  Weight: 198 lb 3.2 oz (89.9 kg)  Height: 5' 3.5" (1.613 m)     Body mass index is 34.56 kg/m.  Physical Exam:   Physical Exam  Constitutional: She appears well-nourished.  HENT:  Head: Normocephalic and atraumatic.  Nose: Right sinus exhibits maxillary sinus tenderness and frontal sinus tenderness. Left sinus exhibits maxillary sinus tenderness and frontal sinus tenderness.  Eyes: Pupils are equal, round, and reactive to light. EOM are normal.  Neck: Normal range of motion. Neck supple.  Cardiovascular: Normal rate, regular rhythm, normal heart sounds and intact distal pulses.  Pulmonary/Chest: Effort normal.  Abdominal:  Soft.  Skin: Skin is warm.  Psychiatric: She has a normal mood and affect. Her behavior is normal.  Nursing note and vitals reviewed.  Assessment and Plan:   [place] was seen today for sinus problem.  Diagnoses and all orders for this visit:  B12 deficiency -     cyanocobalamin ((VITAMIN B-12)) injection 1,000 mcg -     Syringe/Needle, Disp, (SYRINGE 3CC/25GX1") 25G X 1" 3 ML MISC; 1 application by Does not apply route every 30 (thirty) days. -     cyanocobalamin (,VITAMIN B-12,) 1000 MCG/ML injection; 1000 mcg (1 mg) injection once per month.  Subacute maxillary sinusitis -     amoxicillin (AMOXIL) 875 MG tablet; Take 1 tablet (875 mg total) by mouth 2 (two) times daily. -     predniSONE (DELTASONE) 5 MG tablet; 6-5-4-3-2-1-off -     guaiFENesin-codeine (CHERATUSSIN AC) 100-10 MG/5ML syrup; Take 10 mLs by mouth at bedtime as needed for cough or congestion.    . Reviewed expectations re: course of current medical issues. . Discussed self-management of symptoms. . Outlined signs and symptoms indicating need for more acute intervention. . Patient verbalized understanding and all questions were answered. Marland Kitchen Health Maintenance issues including appropriate healthy diet, exercise, and smoking avoidance were discussed with patient. . See orders for this visit as documented in the electronic medical record. . Patient received an After Visit Summary.  CMA served as Education administrator during this visit. History, Physical, and Plan  performed by medical provider. The above documentation has been reviewed and is accurate and complete. Briscoe Deutscher, D.O.  Briscoe Deutscher, DO [place] 11/16/2017

## 2017-11-16 ENCOUNTER — Encounter: Payer: Self-pay | Admitting: Family Medicine

## 2017-11-17 ENCOUNTER — Other Ambulatory Visit: Payer: Self-pay | Admitting: Family Medicine

## 2017-11-20 ENCOUNTER — Ambulatory Visit: Payer: 59 | Admitting: Family Medicine

## 2017-12-19 DIAGNOSIS — M1711 Unilateral primary osteoarthritis, right knee: Secondary | ICD-10-CM | POA: Diagnosis not present

## 2017-12-26 DIAGNOSIS — M1711 Unilateral primary osteoarthritis, right knee: Secondary | ICD-10-CM | POA: Diagnosis not present

## 2018-01-02 DIAGNOSIS — M1711 Unilateral primary osteoarthritis, right knee: Secondary | ICD-10-CM | POA: Diagnosis not present

## 2018-01-13 ENCOUNTER — Emergency Department (HOSPITAL_BASED_OUTPATIENT_CLINIC_OR_DEPARTMENT_OTHER)
Admission: EM | Admit: 2018-01-13 | Discharge: 2018-01-13 | Disposition: A | Discharge status: Home / Self Care | Payer: 59 | Attending: Emergency Medicine | Admitting: Emergency Medicine

## 2018-01-13 ENCOUNTER — Other Ambulatory Visit: Payer: Self-pay

## 2018-01-13 ENCOUNTER — Emergency Department (HOSPITAL_BASED_OUTPATIENT_CLINIC_OR_DEPARTMENT_OTHER): Payer: 59

## 2018-01-13 ENCOUNTER — Encounter (HOSPITAL_BASED_OUTPATIENT_CLINIC_OR_DEPARTMENT_OTHER): Payer: Self-pay | Admitting: Emergency Medicine

## 2018-01-13 DIAGNOSIS — Z79899 Other long term (current) drug therapy: Secondary | ICD-10-CM | POA: Diagnosis not present

## 2018-01-13 DIAGNOSIS — M899 Disorder of bone, unspecified: Secondary | ICD-10-CM | POA: Diagnosis not present

## 2018-01-13 DIAGNOSIS — E876 Hypokalemia: Secondary | ICD-10-CM

## 2018-01-13 DIAGNOSIS — G9389 Other specified disorders of brain: Secondary | ICD-10-CM | POA: Diagnosis not present

## 2018-01-13 DIAGNOSIS — R202 Paresthesia of skin: Secondary | ICD-10-CM | POA: Diagnosis not present

## 2018-01-13 DIAGNOSIS — I10 Essential (primary) hypertension: Secondary | ICD-10-CM | POA: Diagnosis not present

## 2018-01-13 LAB — BASIC METABOLIC PANEL
Anion gap: 9 (ref 5–15)
BUN: 14 mg/dL (ref 6–20)
CHLORIDE: 103 mmol/L (ref 98–111)
CO2: 26 mmol/L (ref 22–32)
Calcium: 9.1 mg/dL (ref 8.9–10.3)
Creatinine, Ser: 0.78 mg/dL (ref 0.44–1.00)
GFR calc Af Amer: >60 mL/min (ref >60)
GFR calc non Af Amer: >60 mL/min (ref >60)
GLUCOSE: 149 mg/dL — AB (ref 70–99)
POTASSIUM: 2.8 mmol/L — AB (ref 3.5–5.1)
Sodium: 138 mmol/L (ref 135–145)

## 2018-01-13 LAB — CBC WITH DIFFERENTIAL/PLATELET
Basophils Absolute: 0 10*3/uL (ref 0.0–0.1)
Basophils Relative: 1 %
EOS PCT: 9 %
Eosinophils Absolute: 0.6 10*3/uL (ref 0.0–0.7)
HCT: 37.9 % (ref 36.0–46.0)
Hemoglobin: 13.2 g/dL (ref 12.0–15.0)
LYMPHS ABS: 2.4 10*3/uL (ref 0.7–4.0)
LYMPHS PCT: 34 %
MCH: 30.8 pg (ref 26.0–34.0)
MCHC: 34.8 g/dL (ref 30.0–36.0)
MCV: 88.3 fL (ref 78.0–100.0)
MONO ABS: 0.5 10*3/uL (ref 0.1–1.0)
Monocytes Relative: 7 %
Neutro Abs: 3.5 10*3/uL (ref 1.7–7.7)
Neutrophils Relative %: 49 %
PLATELETS: 213 10*3/uL (ref 150–400)
RBC: 4.29 MIL/uL (ref 3.87–5.11)
RDW: 12.2 % (ref 11.5–15.5)
WBC: 7.1 10*3/uL (ref 4.0–10.5)

## 2018-01-13 LAB — MAGNESIUM: Magnesium: 1.9 mg/dL (ref 1.7–2.4)

## 2018-01-13 MED ORDER — SODIUM CHLORIDE 0.9 % IV BOLUS
1000.0000 mL | Freq: Once | INTRAVENOUS | Status: AC
  Administered 2018-01-13: 1000 mL via INTRAVENOUS

## 2018-01-13 MED ORDER — POTASSIUM CHLORIDE CRYS ER 20 MEQ PO TBCR
40.0000 meq | EXTENDED_RELEASE_TABLET | Freq: Once | ORAL | Status: AC
  Administered 2018-01-13: 40 meq via ORAL
  Filled 2018-01-13: qty 2

## 2018-01-13 MED ORDER — POTASSIUM CHLORIDE 10 MEQ/100ML IV SOLN
10.0000 meq | INTRAVENOUS | Status: AC
  Administered 2018-01-13 (×2): 10 meq via INTRAVENOUS
  Filled 2018-01-13 (×2): qty 100

## 2018-01-13 MED ORDER — POTASSIUM CHLORIDE CRYS ER 20 MEQ PO TBCR: 20.0000 meq | EXTENDED_RELEASE_TABLET | Freq: Two times a day (BID) | ORAL | 0 refills | Status: DC

## 2018-01-13 NOTE — ED Triage Notes (Signed)
Patient states that last night she started to have a weird sensation to her tongue and trouble swallowing last night  - then throughout the night started to have pressure to her whole face and head - 30 minutes ago she started to have tingling to her face and arms

## 2018-01-13 NOTE — ED Provider Notes (Signed)
[facility] EMERGENCY DEPARTMENT Provider Note   CSN: [id number] Arrival date & time: 01/13/18  1228     History   Chief Complaint Chief Complaint  Patient presents with  . Tingling    HPI [name] [name] is a 52 y.o. female.  HPI  52 year old female with a history of frequent headaches and migraines presents with a headache and tingling.  States last night she all of a sudden developed trouble swallowing and feeling like her tongue was numb.  It is like she is having trouble swallowing her own spit.  She actually tried to drink water and was able to.  Then developed a headache that was about an 8 out of 10 and frontal/bitemporal.  She is been having irregular headaches but feels overall somewhat similar to prior migraines and also not as severe as her normal migraine.  No vomiting.  Started to have some pressure in her forehead as well as some tingling to her face.  Took some aspirin and ibuprofen last night and it seemed to get better.  However today while she was out she all of a sudden developed some bilateral arm paresthesias.  She describes it as tingling running down both of her arms and her neck.  She states there is no weakness.  The headache is actually not too bad right now and is about a 3 out of 10.  If she puts pressure on her ethmoid sinuses it seems to improve the facial tingling.  The pain feels like it is a pressure behind both of her eyes.  There is no dizziness or blurry vision.  Past Medical History:  Diagnosis Date  . Arthritis   . Chicken pox   . Frequent headaches   . GERD (gastroesophageal reflux disease)   . Hypertension   . Liver lesion    Hepatic Adenoma  . Migraines     Patient Active Problem List   Diagnosis Date Noted  . Left hip pain 02/22/2016  . Hypertension 01/23/2016  . Right knee pain 12/11/2014  . Effusion of right knee 12/11/2014  . MIGRAINE HEADACHE 06/22/2007  . SLEEP APNEA 06/22/2007  . LIVER FUNCTION TESTS, ABNORMAL  06/22/2007  . HEPATIC ADENOMA 04/10/2007    Past Surgical History:  Procedure Laterality Date  . knee arthoscopy  01/2017   right     OB History   None      Home Medications    Prior to Admission medications   Medication Sig Start Date End Date Taking? Authorizing Provider  amoxicillin (AMOXIL) 875 MG tablet Take 1 tablet (875 mg total) by mouth 2 (two) times daily. 11/15/17   Briscoe Deutscher, DO  cyanocobalamin (,VITAMIN B-12,) 1000 MCG/ML injection 1000 mcg (1 mg) injection once per month. 11/15/17   Briscoe Deutscher, DO  fluticasone (FLONASE) 50 MCG/ACT nasal spray Place 2 sprays into both nostrils daily.    [provider]  guaiFENesin-codeine (CHERATUSSIN AC) 100-10 MG/5ML syrup Take 10 mLs by mouth at bedtime as needed for cough or congestion. 11/15/17   Briscoe Deutscher, DO  hydrochlorothiazide (HYDRODIURIL) 25 MG tablet Take 1 tablet (25 mg total) by mouth daily. 08/21/17   Briscoe Deutscher, DO  potassium chloride SA (K-DUR,KLOR-CON) 20 MEQ tablet Take 1 tablet (20 mEq total) by mouth 2 (two) times daily for 4 days. 01/13/18 01/17/18  Sherwood Gambler, MD  predniSONE (DELTASONE) 5 MG tablet 6-5-4-3-2-1-off 11/15/17   Briscoe Deutscher, DO  ranitidine (ZANTAC) 150 MG capsule 1 capsule    [provider]  Syringe/Needle, Disp, (SYRINGE 3CC/25GX1") 25G X 1" 3 ML MISC 1 application by Does not apply route every 30 (thirty) days. 11/15/17   Briscoe Deutscher, DO    Family History Family History  Problem Relation Age of Onset  . Cancer Father        larynx cancer  . Diabetes Mother   . Hyperlipidemia Mother   . Cancer Maternal Grandmother   . Diabetes Maternal Grandmother   . Hyperlipidemia Maternal Grandmother   . Hypertension Maternal Grandmother   . Diabetes Maternal Grandfather   . Hyperlipidemia Maternal Grandfather   . Cancer Paternal Grandmother   . Diabetes Paternal Grandmother   . Heart disease Paternal Grandmother   . Hyperlipidemia Paternal Grandmother   .  Hypertension Paternal Grandmother   . Diabetes Sister   . Hyperlipidemia Sister   . Colon cancer Neg Hx   . Colon polyps Neg Hx   . Rectal cancer Neg Hx   . Stomach cancer Neg Hx   . Breast cancer Neg Hx     Social History Social History   Tobacco Use  . Smoking status: Never Smoker  . Smokeless tobacco: Never Used  Substance Use Topics  . Alcohol use: Yes    Alcohol/week: 0.0 standard drinks    Comment: occas  . Drug use: No     Allergies   Patient has no known allergies.   Review of Systems Review of Systems  Constitutional: Negative for fever.  Eyes: Negative for visual disturbance.  Gastrointestinal: Negative for nausea and vomiting.  Musculoskeletal: Negative for neck pain and neck stiffness.  Neurological: Positive for numbness and headaches. Negative for dizziness, weakness and light-headedness.  All other systems reviewed and are negative.    Physical Exam Updated Vital Signs BP (!) 144/74   Pulse 65   Temp 98 F (36.7 C) (Oral)   Resp 17   Ht 5\' 4"  (1.626 m)   Wt 88.9 kg   SpO2 97%   BMI 33.64 kg/m   Physical Exam  Constitutional: She is oriented to person, place, and time. She appears well-developed and well-nourished.  HENT:  Head: Normocephalic and atraumatic.  Right Ear: External ear normal.  Left Ear: External ear normal.  Nose: Nose normal. Right sinus exhibits no maxillary sinus tenderness and no frontal sinus tenderness. Left sinus exhibits no maxillary sinus tenderness and no frontal sinus tenderness.  Eyes: Right eye exhibits no discharge. Left eye exhibits no discharge.  Cardiovascular: Normal rate, regular rhythm and normal heart sounds.  Pulmonary/Chest: Effort normal and breath sounds normal.  Abdominal: Soft. There is no tenderness.  Neurological: She is alert and oriented to person, place, and time.  CN 3-12 grossly intact. 5/5 strength in all 4 extremities. Grossly normal sensation. Normal finger to nose.   Skin: Skin is warm  and dry.  Nursing note and vitals reviewed.    ED Treatments / Results  Labs (all labs ordered are listed, but only abnormal results are displayed) Labs Reviewed  BASIC METABOLIC PANEL - Abnormal; Notable for the following components:      Result Value   Potassium 2.8 (*)    Glucose, Bld 149 (*)    All other components within normal limits  CBC WITH DIFFERENTIAL/PLATELET  MAGNESIUM    EKG EKG Interpretation  Date/Time:  Saturday January 13 2018 13:47:04 EDT Ventricular Rate:  61 PR Interval:    QRS Duration: 115 QT Interval:  455 QTC Calculation: 459 R Axis:   144 Text Interpretation:  Normal sinus rhythm Left posterior fascicular block Low voltage, precordial leads No old tracing to compare Confirmed by Sherwood Gambler [id number]) on 01/13/2018 1:49:59 PM   Radiology No results found.  Procedures Procedures (including critical care time)  Medications Ordered in ED Medications  potassium chloride 10 mEq in 100 mL IVPB (10 mEq Intravenous New Bag/Given 01/13/18 1354)  sodium chloride 0.9 % bolus 1,000 mL (1,000 mLs Intravenous New Bag/Given 01/13/18 1310)  potassium chloride SA (K-DUR,KLOR-CON) CR tablet 40 mEq (40 mEq Oral Given 01/13/18 1351)     Initial Impression / Assessment and Plan / ED Course  I have reviewed the triage vital signs and the nursing notes.  Pertinent labs & imaging results that were available during my care of the patient were reviewed by me and considered in my medical decision making (see chart for details).     Neuro exam is unremarkable.  Headache is mild and never had a severe sudden headache that would suggest subarachnoid hemorrhage.  Given my low suspicion for bleed, I will go straight to MRI of her head and C-spine given some of the radicular tingling.  The potassium is low at 2.8 and I think this is probably the cause but it is only affecting her head neck and arms.  Thus the MRI will be continued.  If this is negative, I think she can be  supplemented with potassium here and discharged home.  Care to Dr. Tamera Punt with MRI pending.  Final Clinical Impressions(s) / ED Diagnoses   Final diagnoses:  None    ED Discharge Orders         Ordered    potassium chloride SA (K-DUR,KLOR-CON) 20 MEQ tablet  2 times daily     01/13/18 1526           Sherwood Gambler, MD 01/13/18 1526

## 2018-01-13 NOTE — ED Notes (Signed)
IV administration continued.

## 2018-01-13 NOTE — ED Provider Notes (Signed)
Care was taken over from Dr. Verner Chol.  Patient presented with tingling to her bilateral upper extremities and face.  She has no weakness.  She was awaiting MRIs of her brain and cervical spine which shows some mild disc protrusion at the lower cervical spine but no significant cord impingement.  No suggestions of a stroke or mass.  She is noted to be hypokalemic and her symptoms have completely resolved in the ED after potassium replacement.  She was discharged home in good condition.  She was given a prescription for potassium.  She was encouraged to have close follow-up with her PCP and advised that she will need to have her potassium rechecked within the next week.  Return precautions were given.   Malvin Johns, MD 01/13/18 [phone]

## 2018-01-13 NOTE — ED Notes (Signed)
ED Provider at bedside. 

## 2018-01-13 NOTE — ED Notes (Signed)
Patient transported to MRI 

## 2018-01-13 NOTE — Discharge Instructions (Addendum)
He need to schedule follow-up appointment with your primary care doctor.  He will need to have your potassium rechecked within the next week.  Return to the emergency department if you have any worsening symptoms.

## 2018-01-13 NOTE — ED Notes (Signed)
Pt still in MRI- family rounded on.

## 2018-01-13 NOTE — ED Notes (Addendum)
IVF paused to go to MRI

## 2018-01-26 ENCOUNTER — Encounter: Payer: Self-pay | Admitting: Family Medicine

## 2018-01-26 ENCOUNTER — Ambulatory Visit: Payer: 59 | Admitting: Family Medicine

## 2018-01-26 VITALS — BP 122/84 | HR 88 | Temp 98.0°F | Ht 63.5 in | Wt 201.4 lb

## 2018-01-26 DIAGNOSIS — R635 Abnormal weight gain: Secondary | ICD-10-CM

## 2018-01-26 DIAGNOSIS — E559 Vitamin D deficiency, unspecified: Secondary | ICD-10-CM

## 2018-01-26 DIAGNOSIS — R600 Localized edema: Secondary | ICD-10-CM | POA: Diagnosis not present

## 2018-01-26 DIAGNOSIS — M25561 Pain in right knee: Secondary | ICD-10-CM | POA: Diagnosis not present

## 2018-01-26 DIAGNOSIS — E876 Hypokalemia: Secondary | ICD-10-CM

## 2018-01-26 DIAGNOSIS — E538 Deficiency of other specified B group vitamins: Secondary | ICD-10-CM

## 2018-01-26 DIAGNOSIS — L219 Seborrheic dermatitis, unspecified: Secondary | ICD-10-CM

## 2018-01-26 DIAGNOSIS — G8929 Other chronic pain: Secondary | ICD-10-CM

## 2018-01-26 MED ORDER — PHENTERMINE HCL 37.5 MG PO TABS: 37.5000 mg | ORAL_TABLET | Freq: Every day | ORAL | 1 refills | Status: DC

## 2018-01-26 MED ORDER — NYSTATIN 100000 UNIT/GM EX OINT: 1.0000 "application " | TOPICAL_OINTMENT | Freq: Two times a day (BID) | CUTANEOUS | 0 refills | Status: DC

## 2018-01-26 MED ORDER — TRIAMCINOLONE ACETONIDE 0.5 % EX OINT: 1.0000 "application " | TOPICAL_OINTMENT | Freq: Two times a day (BID) | CUTANEOUS | 0 refills | Status: DC

## 2018-01-26 MED FILL — TRIAMCINOLONE 0.5% OINTMENT: 0.5 | 15 days supply | Qty: 15 | Fill #0

## 2018-01-26 MED FILL — PHENTERMINE 37.5 MG TABLET: 37.5 | 30 days supply | Qty: 30 | Fill #0

## 2018-01-26 MED FILL — NYSTATIN 100,000 UNITS/GM O: 100000 | 30 days supply | Qty: 30 | Fill #0

## 2018-01-26 NOTE — Addendum Note (Signed)
Addended by: Kayren Eaves T on: 01/26/2018 04:25 PM   Modules accepted: Orders

## 2018-01-26 NOTE — Progress Notes (Signed)
[name] [name] is a 52 y.o. female is here for follow up.  History of Present Illness:   Shaune Pascal CMA acting as scribe for Dr. Juleen China.  HPI: Patient comes in today for a follow up from her hospital visit. Patient would like to her her potassium checked today.   Right leg: Patient is having some right leg edema that goes from her hip to her ankle. Patient had knee surgery in October 2018 and believes that is edema is coming from that.   Rash: Patient has a rash on the side of her nose that she would like to have it looked at.   Health Maintenance Due  Topic Date Due  . INFLUENZA VACCINE  11/23/2017   Depression screen [facility] 2/9 08/21/2017 06/01/2016 02/05/2016  Decreased Interest 0 0 0  Down, Depressed, Hopeless 0 0 0  PHQ - 2 Score 0 0 0   PMHx, SurgHx, SocialHx, FamHx, Medications, and Allergies were reviewed in the Visit Navigator and updated as appropriate.   Patient Active Problem List   Diagnosis Date Noted  . Left hip pain 02/22/2016  . Hypertension 01/23/2016  . Right knee pain 12/11/2014  . Effusion of right knee 12/11/2014  . MIGRAINE HEADACHE 06/22/2007  . SLEEP APNEA 06/22/2007  . LIVER FUNCTION TESTS, ABNORMAL 06/22/2007  . HEPATIC ADENOMA 04/10/2007   Social History   Tobacco Use  . Smoking status: Never Smoker  . Smokeless tobacco: Never Used  Substance Use Topics  . Alcohol use: Yes    Alcohol/week: 0.0 standard drinks    Comment: occas  . Drug use: No   Current Medications and Allergies:   .  Biotin 5000 MCG TABS, , Disp: , Rfl:  .  Cholecalciferol (D3-1000) 1000 units capsule, , Disp: , Rfl:  .  cyanocobalamin (,VITAMIN B-12,) 1000 MCG/ML injection, 1000 mcg (1 mg) injection once per month., Disp: 1 mL, Rfl: 12 .  fluticasone (FLONASE) 50 MCG/ACT nasal spray, Place 2 sprays into both nostrils daily., Disp: , Rfl:  .  hydrochlorothiazide (HYDRODIURIL) 25 MG tablet, Take 1 tablet (25 mg total) by mouth daily., Disp: 90 tablet, Rfl: 3 .   ranitidine (ZANTAC) 150 MG capsule, 1 capsule, Disp: , Rfl:  .  Syringe/Needle, Disp, (SYRINGE 3CC/25GX1") 25G X 1" 3 ML MISC, 1 application by Does not apply route every 30 (thirty) days., Disp: 12 each, Rfl: 0 .  zolpidem (AMBIEN) 5 MG tablet, , Disp: , Rfl:   No Known Allergies   Review of Systems   Pertinent items are noted in the HPI. Otherwise, ROS is negative.  Vitals:   Vitals:   01/26/18 1543  BP: 122/84  Pulse: 88  Temp: 98 F (36.7 C)  TempSrc: Oral  SpO2: 97%  Weight: 201 lb 6.4 oz (91.4 kg)  Height: 5' 3.5" (1.613 m)     Body mass index is 35.12 kg/m.  Physical Exam:   Physical Exam  Constitutional: She appears well-nourished.  HENT:  Head: Normocephalic and atraumatic.  Eyes: Pupils are equal, round, and reactive to light. EOM are normal.  Neck: Normal range of motion. Neck supple.  Cardiovascular: Normal rate, regular rhythm, normal heart sounds, intact distal pulses and normal pulses.  Pulmonary/Chest: Effort normal.  Abdominal: Soft.  Musculoskeletal:       Right hip: She exhibits decreased range of motion.       Right knee: Tenderness found. Medial joint line and lateral joint line tenderness noted.  Skin: Skin is warm.  Right of  nose - seborrhea.  Psychiatric: She has a normal mood and affect. Her behavior is normal.  Nursing note and vitals reviewed.  Assessment and Plan:   [name] [name] was seen today for follow-up.  Diagnoses and all orders for this visit:  Hypokalemia -     Comp Met (CMET); Future -     Magnesium; Future  Leg edema, right -     Comp Met (CMET); Future  Chronic pain of right knee  Seborrheic dermatitis -     triamcinolone ointment (KENALOG) 0.5 %; Apply 1 application topically 2 (two) times daily. -     nystatin ointment (MYCOSTATIN); Apply 1 application topically 2 (two) times daily.  Weight gain Comments: Patient wants to lose 20 pounds. Going out of the country and wants to enjoy vacation without much pain. Previously  took Phentermine without issues.  Plan: 1. Diagnostic studies to rule out secondary causes of obesity: see below. 2. General patient education:   Average sustained weight loss in long-term studies w/lifestyle interventions alone is 10-15 lb.  Importance of long-term maintenance tx in weight loss.  Use non-food self-rewards to reinforce behavior changes.  Elicit support from others; identify saboteurs.  Practical target weight is usually around 2 BMI units below current weight. 3. Diet interventions:   Risks of dieting were reviewed, including fatigue, temporary hair loss, gallstone formation, gout, and with very low calorie diets, electrolyte abnormalities, nutrient inadequacies, and loss of lean body mass. 4. Exercise intervention:   Informal measures, e.g. taking stairs instead of elevator.  Formal exercise regimen options. 5. Other behavioral treatment: stress management. 6. Other treatment: Medication: phentermine. 7. Patient to keep a weight log that we will review at follow up. 8. Follow up: 3 months and as needed.  Orders: -     phentermine (ADIPEX-P) 37.5 MG tablet; Take 1 tablet (37.5 mg total) by mouth daily before breakfast.  B12 deficiency -     Vitamin B12; Future  Vitamin D deficiency -     VITAMIN D 25 Hydroxy (Vit-D Deficiency, Fractures); Future   . Reviewed expectations re: course of current medical issues. . Discussed self-management of symptoms. . Outlined signs and symptoms indicating need for more acute intervention. . Patient verbalized understanding and all questions were answered. Marland Kitchen Health Maintenance issues including appropriate healthy diet, exercise, and smoking avoidance were discussed with patient. . See orders for this visit as documented in the electronic medical record. . Patient received an After Visit Summary.  CMA served as Education administrator during this visit. History, Physical, and Plan performed by medical provider. The above documentation has  been reviewed and is accurate and complete. Briscoe Deutscher, D.O.  Briscoe Deutscher, DO [place], [facility] 01/26/2018

## 2018-01-26 NOTE — Addendum Note (Signed)
Addended by: Kayren Eaves T on: 01/26/2018 04:24 PM   Modules accepted: Orders

## 2018-01-27 LAB — COMPREHENSIVE METABOLIC PANEL
AG Ratio: 1.6 (calc) (ref 1.0–2.5)
ALT: 55 U/L — ABNORMAL HIGH (ref 6–29)
AST: 36 U/L — ABNORMAL HIGH (ref 10–35)
Albumin: 4.2 g/dL (ref 3.6–5.1)
Alkaline phosphatase (APISO): 79 U/L (ref 33–130)
BUN: 16 mg/dL (ref 7–25)
CO2: 27 mmol/L (ref 20–32)
Calcium: 9.7 mg/dL (ref 8.6–10.4)
Chloride: 102 mmol/L (ref 98–110)
Creat: 0.79 mg/dL (ref 0.50–1.05)
Globulin: 2.6 g/dL (calc) (ref 1.9–3.7)
Glucose, Bld: 83 mg/dL (ref 65–99)
Potassium: 3.4 mmol/L — ABNORMAL LOW (ref 3.5–5.3)
Sodium: 140 mmol/L (ref 135–146)
Total Bilirubin: 0.7 mg/dL (ref 0.2–1.2)
Total Protein: 6.8 g/dL (ref 6.1–8.1)

## 2018-01-27 LAB — VITAMIN B12: Vitamin B-12: >2000 pg/mL — ABNORMAL HIGH (ref 200–1100)

## 2018-01-27 LAB — VITAMIN D 25 HYDROXY (VIT D DEFICIENCY, FRACTURES): Vit D, 25-Hydroxy: 42 ng/mL (ref 30–100)

## 2018-01-27 LAB — MAGNESIUM: Magnesium: 1.9 mg/dL (ref 1.5–2.5)

## 2018-01-29 ENCOUNTER — Encounter: Payer: Self-pay | Admitting: Family Medicine

## 2018-01-29 MED ORDER — POTASSIUM CHLORIDE ER 10 MEQ PO TBCR: 20.0000 meq | EXTENDED_RELEASE_TABLET | Freq: Every day | ORAL | 6 refills | Status: DC

## 2018-01-29 MED FILL — POTASSIUM CHL ER M10 TABLET: 10 | 30 days supply | Qty: 60 | Fill #0

## 2018-01-29 NOTE — Addendum Note (Signed)
Addended by: Briscoe Deutscher R on: 01/29/2018 07:16 AM   Modules accepted: Orders

## 2018-02-11 NOTE — Progress Notes (Signed)
[name] [name] is a 52 y.o. female is here for follow up.  History of Present Illness:   HPI:   1. Leg edema, right. Above knee.  Patient has known osteoarthritis of her right knee.  She has noticed increased swelling and tightness of the skin and soft tissue superiorly over the past week.  She denies any acute trauma or overuse.  She denies any redness, heat, skin lesions, or history of the same.  She has done nothing for treatment.   2. Hypokalemia.  Ongoing.  She has been taking supplements.  Due for retesting today.   3. Elevated LFTs.   Lab Results  Component Value Date   ALT 57 (H) 02/12/2018   AST 39 (H) 02/12/2018   ALKPHOS 69 02/12/2018   BILITOT 1.3 (H) 02/12/2018    4. Essential hypertension.  Compliant with medication.  Denies chest pain, shortness of breath, headaches, or other red flags.   5. Morbid obesity ([place]).   Wt Readings from Last 3 Encounters:  02/12/18 194 lb 12.8 oz (88.4 kg)  02/12/18 194 lb 12.8 oz (88.4 kg)  01/26/18 201 lb 6.4 oz (91.4 kg)     Health Maintenance Due  Topic Date Due  . INFLUENZA VACCINE  11/23/2017   Depression screen [facility] 2/9 08/21/2017 06/01/2016 02/05/2016  Decreased Interest 0 0 0  Down, Depressed, Hopeless 0 0 0  PHQ - 2 Score 0 0 0   PMHx, SurgHx, SocialHx, FamHx, Medications, and Allergies were reviewed in the Visit Navigator and updated as appropriate.   Patient Active Problem List   Diagnosis Date Noted  . Left hip pain 02/22/2016  . Hypertension 01/23/2016  . Right knee pain 12/11/2014  . Effusion of right knee 12/11/2014  . MIGRAINE HEADACHE 06/22/2007  . SLEEP APNEA 06/22/2007  . LIVER FUNCTION TESTS, ABNORMAL 06/22/2007  . HEPATIC ADENOMA 04/10/2007   Social History   Tobacco Use  . Smoking status: Never Smoker  . Smokeless tobacco: Never Used  Substance Use Topics  . Alcohol use: Yes    Alcohol/week: 0.0 standard drinks    Comment: occas  . Drug use: No   Current Medications and Allergies:    .  Biotin 5000 MCG TABS, , Disp: , Rfl:  .  Cholecalciferol (D3-1000) 1000 units capsule, , Disp: , Rfl:  .  fluticasone (FLONASE) 50 MCG/ACT nasal spray, Place 2 sprays into both nostrils daily., Disp: , Rfl:  .  hydrochlorothiazide (HYDRODIURIL) 25 MG tablet, Take 1 tablet (25 mg total) by mouth daily., Disp: 90 tablet, Rfl: 3 .  nystatin ointment (MYCOSTATIN), Apply 1 application topically 2 (two) times daily., Disp: 30 g, Rfl: 0 .  phentermine (ADIPEX-P) 37.5 MG tablet, Take 1 tablet (37.5 mg total) by mouth daily before breakfast., Disp: 30 tablet, Rfl: 1 .  potassium chloride (K-DUR) 10 MEQ tablet, Take 2 tablets (20 mEq total) by mouth daily., Disp: 60 tablet, Rfl: 6 .  ranitidine (ZANTAC) 150 MG capsule, 1 capsule, Disp: , Rfl:  .  Syringe/Needle, Disp, (SYRINGE 3CC/25GX1") 25G X 1" 3 ML MISC, 1 application by Does not apply route every 30 (thirty) days., Disp: 12 each, Rfl: 0 .  triamcinolone ointment (KENALOG) 0.5 %, Apply 1 application topically 2 (two) times daily., Disp: 15 g, Rfl: 0 .  zolpidem (AMBIEN) 5 MG tablet, , Disp: , Rfl:   No Known Allergies   Review of Systems   Pertinent items are noted in the HPI. Otherwise, ROS is negative.  Vitals:  Vitals:   02/12/18 0842  BP: 128/84  Pulse: 100  Temp: 98.3 F (36.8 C)  TempSrc: Oral  SpO2: 97%  Weight: 194 lb 12.8 oz (88.4 kg)  Height: 5' 3.5" (1.613 m)     Body mass index is 33.97 kg/m.  Physical Exam:   Physical Exam  Constitutional: She appears well-nourished.  HENT:  Head: Normocephalic and atraumatic.  Eyes: Pupils are equal, round, and reactive to light. EOM are normal.  Neck: Normal range of motion. Neck supple.  Cardiovascular: Normal rate, regular rhythm, normal heart sounds and intact distal pulses.  Pulmonary/Chest: Effort normal.  Abdominal: Soft.  Musculoskeletal:       Legs: Skin: Skin is warm.  Psychiatric: She has a normal mood and affect. Her behavior is normal.  Nursing note and  vitals reviewed.  Assessment and Plan:   [name] [name] was seen today for follow-up.  Diagnoses and all orders for this visit:  Leg edema, right -     Comp Met (CMET)  Hypokalemia Comments: Taking supplements. Will recheck today. Orders: -     Comp Met (CMET)  Elevated LFTs Comments: With Hx of known fatty liver + benign liver nodules. Orders: -     Comp Met (CMET)  Essential hypertension Comments: Controlled.  Morbid obesity ([place]) Comments: Down 6 pounds since starting Phentermine. No side effects. Taking 1/2 dose q am.    . Reviewed expectations re: course of current medical issues. . Discussed self-management of symptoms. . Outlined signs and symptoms indicating need for more acute intervention. . Patient verbalized understanding and all questions were answered. Marland Kitchen Health Maintenance issues including appropriate healthy diet, exercise, and smoking avoidance were discussed with patient. . See orders for this visit as documented in the electronic medical record. . Patient received an After Visit Summary.   Briscoe Deutscher, DO [place], [facility] 02/12/2018

## 2018-02-12 ENCOUNTER — Ambulatory Visit: Payer: 59 | Admitting: Sports Medicine

## 2018-02-12 ENCOUNTER — Encounter: Payer: Self-pay | Admitting: Sports Medicine

## 2018-02-12 ENCOUNTER — Ambulatory Visit: Payer: 59 | Admitting: Family Medicine

## 2018-02-12 ENCOUNTER — Encounter: Payer: Self-pay | Admitting: Family Medicine

## 2018-02-12 VITALS — BP 128/84 | HR 100 | Temp 98.3°F | Ht 63.5 in | Wt 194.8 lb

## 2018-02-12 DIAGNOSIS — R7989 Other specified abnormal findings of blood chemistry: Secondary | ICD-10-CM

## 2018-02-12 DIAGNOSIS — R945 Abnormal results of liver function studies: Secondary | ICD-10-CM

## 2018-02-12 DIAGNOSIS — M25461 Effusion, right knee: Secondary | ICD-10-CM

## 2018-02-12 DIAGNOSIS — R600 Localized edema: Secondary | ICD-10-CM

## 2018-02-12 DIAGNOSIS — E876 Hypokalemia: Secondary | ICD-10-CM

## 2018-02-12 DIAGNOSIS — I10 Essential (primary) hypertension: Secondary | ICD-10-CM | POA: Diagnosis not present

## 2018-02-12 LAB — COMPREHENSIVE METABOLIC PANEL
ALT: 57 U/L — ABNORMAL HIGH (ref 0–35)
AST: 39 U/L — ABNORMAL HIGH (ref 0–37)
Albumin: 4.2 g/dL (ref 3.5–5.2)
Alkaline Phosphatase: 69 U/L (ref 39–117)
BUN: 16 mg/dL (ref 6–23)
CO2: 27 mEq/L (ref 19–32)
Calcium: 9.6 mg/dL (ref 8.4–10.5)
Chloride: 103 mEq/L (ref 96–112)
Creatinine, Ser: 0.86 mg/dL (ref 0.40–1.20)
GFR: 73.15 mL/min (ref >60.00)
Glucose, Bld: 104 mg/dL — ABNORMAL HIGH (ref 70–99)
Potassium: 3.4 mEq/L — ABNORMAL LOW (ref 3.5–5.1)
Sodium: 139 mEq/L (ref 135–145)
Total Bilirubin: 1.3 mg/dL — ABNORMAL HIGH (ref 0.2–1.2)
Total Protein: 7.4 g/dL (ref 6.0–8.3)

## 2018-02-12 NOTE — Assessment & Plan Note (Signed)
Very large effusion of the right knee with tricompartmental degenerative changes appreciated on MRI from 2006 months patient report from 2016 as well as from patient report from [facility].  We will plan to go and get her set up with prior authorization for Zilretta injection we did discuss that the definitive treatment for this is total knee arthroplasty.  Body Helix compression sleeve trialed today.

## 2018-02-12 NOTE — Progress Notes (Signed)
[name] [name], [place] at [facility] [place] - 52 y.o. female MRN [id number]  Date of birth: 1965-10-14  Visit Date: 02/12/2018  PCP: Briscoe Deutscher, DO   Referred by: Briscoe Deutscher, DO   Scribe(s) for today's visit: [name] [name], CMA  SUBJECTIVE:  [name] [name] "[name] [name]" is here for Initial Assessment (R knee pain)  Referred by: Dr. Briscoe Deutscher  HPI: 02/12/2018: Her R knee pain symptoms INITIALLY: Began about years ago and has worsened since 01/2017. She was showing her daughter how to do a round-off and the next morning her knee was swollen to the size of her thigh.  Described as mild discomfort/tightness, non-radiating. She does report radiating pain on the R side into the R leg, she thinks d/t overcompensation.  Worsened with stairs, standing still for any length of time.  Improved with rest/elevation.  Additional associated symptoms include: C/o R LE swelling. She has knee scoped 02/22/2017, meniscus tear. The end of 03/2017 she noticed increased swelling. She denies clicking, popping, locking. She had trouble with catching in the past. The medial aspect of the knee is tender to palpation. At times she will have throbbing and aching but not on a daily basis.     At this time symptoms show no change compared to onset. She has been icing her knee multiple times daily, decreased activity level, and elevates the knee when at rest. She noticed minimal change with ice. She has tried Naproxen, IBU with some relief. She wraps the knee in ACE wrap, wears neoprene sleeve. She has tried heat with no relief.  She has received 3 steroid knee inj, she received visco inj (Dr. Veverly Fells). She was told that swelling from from arthritis. She was advised that her next step is a knee replacement.   Last XR was June/ July 2019   REVIEW OF SYSTEMS: Denies night time disturbances. Denies fevers, chills, or night  sweats. Denies unexplained weight loss. Denies personal history of cancer. Denies changes in bowel or bladder habits. Denies recent unreported falls. Denies new or worsening dyspnea or wheezing. Reports headaches related to low Potassium.  Reports numbness, tingling or weakness in the extremities - related to low potassium.  Denies dizziness or presyncopal episodes Reports lower extremity edema    HISTORY:  Prior history reviewed and updated per electronic medical record.  Social History   Occupational History  . Not on file  Tobacco Use  . Smoking status: Never Smoker  . Smokeless tobacco: Never Used  Substance and Sexual Activity  . Alcohol use: Yes    Alcohol/week: 0.0 standard drinks    Comment: occas  . Drug use: No  . Sexual activity: Never   Social History   Social History Narrative  . Not on file    Past Medical History:  Diagnosis Date  . Arthritis   . Chicken pox   . Frequent headaches   . GERD (gastroesophageal reflux disease)   . Hypertension   . Liver lesion    Hepatic Adenoma  . Migraines    Past Surgical History:  Procedure Laterality Date  . knee arthoscopy  01/2017   right   family history includes Cancer in her father, maternal grandmother, and paternal grandmother; Diabetes in her maternal grandfather, maternal grandmother, mother, paternal grandmother, and sister; Heart disease in her paternal grandmother; Hyperlipidemia in her maternal grandfather, maternal grandmother, mother, paternal grandmother, and sister; Hypertension in her maternal grandmother and paternal grandmother.  There is no history of Colon cancer, Colon polyps, Rectal cancer, Stomach cancer, or Breast cancer.  DATA OBTAINED & REVIEWED:   Recent Labs    08/21/17 0912  HGBA1C 6.0   . MRI of the right knee from 2016 showed tricompartmental degenerative changes with blistering of the cartilage and intact meniscus. . Outside x-rays per patient report did show tricompartmental  bone-on-bone degenerative change. .   OBJECTIVE:  VS:  HT:5' 3.5" (161.3 cm)   WT:194 lb 12.8 oz (88.4 kg)  BMI:33.96    BP:128/84  HR:100bpm  TEMP: ( )  RESP:97 %   PHYSICAL EXAM: CONSTITUTIONAL: Well-developed, Well-nourished and In no acute distress PSYCHIATRIC: Alert & appropriately interactive. and Not depressed or anxious appearing. RESPIRATORY: No increased work of breathing and Trachea Midline EYES: Pupils are equal., EOM intact without nystagmus. and No scleral icterus.  VASCULAR EXAM: Warm and well perfused NEURO: unremarkable  MSK Exam: Right knee  Slight genu valgus.  Large effusion.  Generalized swelling and osteoarthritic bossing. No overlying skin changes. Generalized medial and lateral joint line pain but nonfocal.  Positive patellar grind.   RANGE OF MOTION & STRENGTH  Limited and painful: Terminal flexion secondary to large effusion.   SPECIALITY TESTING:  3 to 4 mm of opening with varus and valgus stressing but nonpainful.  No focal pain with McMurray's.     ASSESSMENT   1. Effusion of right knee     PLAN:  Pertinent additional documentation may be included in corresponding procedure notes, imaging studies, problem based documentation and patient instructions.  Procedures:  . None  Medications:  No orders of the defined types were placed in this encounter.  Discussion/Instructions: Effusion of right knee Very large effusion of the right knee with tricompartmental degenerative changes appreciated on MRI from 2006 months patient report from 2016 as well as from patient report from [facility].  We will plan to go and get her set up with prior authorization for Zilretta injection we did discuss that the definitive treatment for this is total knee arthroplasty.  Body Helix compression sleeve trialed today.  .   . Discussed red flag symptoms that warrant earlier emergent evaluation and patient voices understanding. . Activity modifications and  the importance of avoiding exacerbating activities (limiting pain to no more than a 4 / 10 during or following activity) recommended and discussed.  Follow-up:  . Return as needed,  for Zilretta injection.   . If any lack of improvement consider: referral to Orthopedics for Total knee arthroplasty  . At follow up will plan to consider: Zilretta (sustained release Triamcinolone) - authorization obtained today     CMA/ATC served as scribe during this visit. History, Physical, and Plan performed by medical provider. Documentation and orders reviewed and attested to.      Gerda Diss, [place] Physician

## 2018-02-12 NOTE — Patient Instructions (Addendum)
I recommend you obtained a compression sleeve to help with your joint problems. There are many options on the market however I recommend obtaining a XL Full Knee Body Helix compression sleeve.  You can find information (including how to appropriate measure yourself for sizing) can be found at www.Body http://www.lambert.com/.  Many of these products are health savings account (HSA) eligible.   You can use the compression sleeve at any time throughout the day but is most important to use while being active as well as for 2 hours post-activity.   It is appropriate to ice following activity with the compression sleeve in place.

## 2018-02-17 MED ORDER — TRIAMTERENE-HCTZ 37.5-25 MG PO TABS: 1.0000 | ORAL_TABLET | Freq: Every day | ORAL | 0 refills | Status: DC

## 2018-02-17 NOTE — Addendum Note (Signed)
Addended by: Briscoe Deutscher R on: 02/17/2018 09:46 AM   Modules accepted: Orders

## 2018-02-26 ENCOUNTER — Telehealth: Payer: Self-pay

## 2018-02-26 ENCOUNTER — Encounter: Payer: Self-pay | Admitting: Sports Medicine

## 2018-02-26 NOTE — Telephone Encounter (Signed)
Called pt and left VM to call the office.   Copied from [place] [id number]. Topic: General - Inquiry >> Feb 20, 2018 10:32 AM Jasper Loser, CMA wrote: Reason for CRM: Zilretta - No PA required. Once deductible ($2,000) has been met, Orvan Seen is covered at 100% with a $100 specialist OV copay. Once the OOP max ($6,000) has been met, the copay will no longer apply.

## 2018-02-27 NOTE — Telephone Encounter (Signed)
Attempted to call the pt again and LM regarding Zilretta coverage benefits but VM was full.

## 2018-03-02 NOTE — Telephone Encounter (Signed)
Unable to reach pt by phone, letter mailed.

## 2018-03-13 ENCOUNTER — Other Ambulatory Visit: Payer: Self-pay | Admitting: Family Medicine

## 2018-03-15 ENCOUNTER — Other Ambulatory Visit: Payer: Self-pay | Admitting: Orthopedic Surgery

## 2018-03-15 DIAGNOSIS — M25561 Pain in right knee: Secondary | ICD-10-CM | POA: Diagnosis not present

## 2018-03-15 DIAGNOSIS — M1711 Unilateral primary osteoarthritis, right knee: Secondary | ICD-10-CM | POA: Diagnosis not present

## 2018-03-15 DIAGNOSIS — Z9889 Other specified postprocedural states: Secondary | ICD-10-CM | POA: Diagnosis not present

## 2018-03-15 DIAGNOSIS — G8929 Other chronic pain: Secondary | ICD-10-CM | POA: Diagnosis not present

## 2018-03-15 MED FILL — PHENTERMINE 37.5 MG TABLET: 37.5 | 30 days supply | Qty: 30 | Fill #1

## 2018-03-16 ENCOUNTER — Other Ambulatory Visit: Payer: Self-pay | Admitting: Obstetrics & Gynecology

## 2018-03-16 ENCOUNTER — Other Ambulatory Visit: Payer: Self-pay | Admitting: Orthopedic Surgery

## 2018-03-16 DIAGNOSIS — Z1231 Encounter for screening mammogram for malignant neoplasm of breast: Secondary | ICD-10-CM

## 2018-03-16 DIAGNOSIS — R609 Edema, unspecified: Secondary | ICD-10-CM

## 2018-03-16 DIAGNOSIS — R52 Pain, unspecified: Secondary | ICD-10-CM

## 2018-03-16 DIAGNOSIS — R531 Weakness: Secondary | ICD-10-CM

## 2018-03-26 DIAGNOSIS — Z23 Encounter for immunization: Secondary | ICD-10-CM | POA: Diagnosis not present

## 2018-03-29 ENCOUNTER — Ambulatory Visit
Admission: RE | Admit: 2018-03-29 | Discharge: 2018-03-29 | Disposition: A | Discharge status: Home / Self Care | Payer: 59 | Source: Ambulatory Visit | Attending: Obstetrics & Gynecology | Admitting: Obstetrics & Gynecology

## 2018-03-29 DIAGNOSIS — Z1231 Encounter for screening mammogram for malignant neoplasm of breast: Secondary | ICD-10-CM

## 2018-03-31 ENCOUNTER — Other Ambulatory Visit: Payer: 59

## 2018-04-11 DIAGNOSIS — Z9889 Other specified postprocedural states: Secondary | ICD-10-CM | POA: Diagnosis not present

## 2018-04-11 DIAGNOSIS — M1711 Unilateral primary osteoarthritis, right knee: Secondary | ICD-10-CM | POA: Diagnosis not present

## 2018-04-11 DIAGNOSIS — G8929 Other chronic pain: Secondary | ICD-10-CM | POA: Diagnosis not present

## 2018-04-13 ENCOUNTER — Other Ambulatory Visit: Payer: Self-pay

## 2018-04-13 ENCOUNTER — Ambulatory Visit: Payer: 59 | Admitting: Family Medicine

## 2018-04-13 VITALS — BP 130/82 | HR 100 | Temp 98.6°F | Ht 64.0 in | Wt 186.6 lb

## 2018-04-13 DIAGNOSIS — R5381 Other malaise: Secondary | ICD-10-CM

## 2018-04-13 DIAGNOSIS — R635 Abnormal weight gain: Secondary | ICD-10-CM

## 2018-04-13 DIAGNOSIS — I10 Essential (primary) hypertension: Secondary | ICD-10-CM

## 2018-04-13 DIAGNOSIS — E669 Obesity, unspecified: Secondary | ICD-10-CM

## 2018-04-13 DIAGNOSIS — R5383 Other fatigue: Secondary | ICD-10-CM | POA: Diagnosis not present

## 2018-04-13 DIAGNOSIS — R7989 Other specified abnormal findings of blood chemistry: Secondary | ICD-10-CM

## 2018-04-13 DIAGNOSIS — E559 Vitamin D deficiency, unspecified: Secondary | ICD-10-CM

## 2018-04-13 DIAGNOSIS — D134 Benign neoplasm of liver: Secondary | ICD-10-CM | POA: Diagnosis not present

## 2018-04-13 DIAGNOSIS — F5101 Primary insomnia: Secondary | ICD-10-CM

## 2018-04-13 DIAGNOSIS — Z7184 Encounter for health counseling related to travel: Secondary | ICD-10-CM

## 2018-04-13 DIAGNOSIS — N951 Menopausal and female climacteric states: Secondary | ICD-10-CM | POA: Insufficient documentation

## 2018-04-13 DIAGNOSIS — K219 Gastro-esophageal reflux disease without esophagitis: Secondary | ICD-10-CM

## 2018-04-13 DIAGNOSIS — R945 Abnormal results of liver function studies: Secondary | ICD-10-CM

## 2018-04-13 LAB — COMPREHENSIVE METABOLIC PANEL
ALT: 49 U/L — ABNORMAL HIGH (ref 0–35)
AST: 36 U/L (ref 0–37)
Albumin: 4.4 g/dL (ref 3.5–5.2)
Alkaline Phosphatase: 79 U/L (ref 39–117)
BUN: 19 mg/dL (ref 6–23)
CO2: 28 mEq/L (ref 19–32)
Calcium: 10 mg/dL (ref 8.4–10.5)
Chloride: 102 mEq/L (ref 96–112)
Creatinine, Ser: 0.93 mg/dL (ref 0.40–1.20)
GFR: 66.79 mL/min (ref >60.00)
Glucose, Bld: 98 mg/dL (ref 70–99)
Potassium: 4.4 mEq/L (ref 3.5–5.1)
Sodium: 139 mEq/L (ref 135–145)
Total Bilirubin: 0.9 mg/dL (ref 0.2–1.2)
Total Protein: 7.4 g/dL (ref 6.0–8.3)

## 2018-04-13 LAB — VITAMIN B12: Vitamin B-12: 628 pg/mL (ref 211–911)

## 2018-04-13 LAB — TSH: TSH: 0.91 u[IU]/mL (ref 0.35–4.50)

## 2018-04-13 LAB — VITAMIN D 25 HYDROXY (VIT D DEFICIENCY, FRACTURES): VITD: 31.96 ng/mL (ref 30.00–100.00)

## 2018-04-13 MED ORDER — ZOLPIDEM TARTRATE 5 MG PO TABS: 5.0000 mg | ORAL_TABLET | Freq: Every day | ORAL | 0 refills | Status: DC

## 2018-04-13 MED ORDER — BACLOFEN 5 MG PO TABS: 5.0000 mg | ORAL_TABLET | Freq: Every day | ORAL | 0 refills | Status: DC

## 2018-04-13 MED ORDER — PHENTERMINE HCL 37.5 MG PO TABS: 37.5000 mg | ORAL_TABLET | Freq: Every day | ORAL | 2 refills | Status: DC

## 2018-04-13 NOTE — Progress Notes (Signed)
[name] [name] is a 52 y.o. female is here for follow up.  History of Present Illness:   [name] [name], CMA acting as scribe for [name] [name].   HPI:  1. Leg edema, patient is being treated by orthopedics at this time. She had steroid injection last week. Has MRI appointment coming up soon with partial knee replacement next year possible this year.    2 Elevated LFTs. Patient will have rechecked with lab work today.    3. Essential hypertension.  Patient is currently on Maxzide 25 one tablet daily. She was changed to that after last visit. At first she was having some nausea but has changed how she takes. Making sure it is with food and has not had any issue with that. She started at 1/2 tab but has moved up to one tab daily.  Review: taking medications as instructed, no medication side effects noted, no TIAs, no chest pain on exertion, no dyspnea on exertion, no swelling of ankles. Smoker: No.   BP Readings from Last 3 Encounters:  04/13/18 130/82  02/12/18 128/84  02/12/18 128/84   Lab Results  Component Value Date   CREATININE 0.93 04/13/2018   CREATININE 0.86 02/12/2018   CREATININE 0.79 01/26/2018        There are no preventive care reminders to display for this patient. Depression screen [facility] 2/9 04/13/2018 08/21/2017 06/01/2016  Decreased Interest 0 0 0  Down, Depressed, Hopeless 0 0 0  PHQ - 2 Score 0 0 0  Altered sleeping 0 - -  Tired, decreased energy 0 - -  Change in appetite 0 - -  Feeling bad or failure about yourself  0 - -  Trouble concentrating 0 - -  Moving slowly or fidgety/restless 0 - -  Suicidal thoughts 0 - -  PHQ-9 Score 0 - -  Difficult doing work/chores Not difficult at all - -   PMHx, SurgHx, SocialHx, FamHx, Medications, and Allergies were reviewed in the Visit Navigator and updated as appropriate.   Patient Active Problem List   Diagnosis Date Noted  . Migraines 04/14/2018  . Obesity (BMI 30.0-34.9) 04/14/2018  . Vitamin D  deficiency 04/14/2018  . Primary insomnia 04/14/2018  . GERD (gastroesophageal reflux disease)   . Menopausal syndrome 04/13/2018  . S/P right knee arthroscopy 02/22/2017  . Chronic pansinusitis 07/06/2016  . 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 Hypertension 01/23/2016  . OSA (obstructive sleep apnea) 06/22/2007  . Abnormal LFTs 06/22/2007  . 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 Smoking status: Never Smoker  . Smokeless tobacco: Never Used  Substance Use Topics  . Alcohol use: Yes    Alcohol/week: 0.0 standard drinks    Comment: occas  . Drug use: No   Current Medications and Allergies:   .  Biotin 5000 MCG TABS, , Disp: , Rfl:  .  Cholecalciferol (D3-1000) 1000 units capsule, , Disp: , Rfl:  .  fluticasone (FLONASE) 50 MCG/ACT nasal spray, Place 2 sprays into both nostrils daily., Disp: , Rfl:  .  nystatin ointment (MYCOSTATIN), Apply 1 application topically 2 (two) times daily., Disp: 30 g, Rfl: 0 .  phentermine (ADIPEX-P) 37.5 MG tablet, Take 1 tablet (37.5 mg total) by mouth daily before breakfast., Disp: 30 tablet, Rfl: 1 .  potassium chloride (K-DUR,KLOR-CON) 10 MEQ tablet, , Disp: , Rfl: 6 .  ranitidine (ZANTAC) 150 MG capsule, 1 capsule, Disp: , Rfl:  .  Syringe/Needle, Disp, (SYRINGE 3CC/25GX1")  25G X 1" 3 ML MISC, 1 application by Does not apply route every 30 (thirty) days., Disp: 12 each, Rfl: 0 .  triamcinolone ointment (KENALOG) 0.5 %, Apply 1 application topically 2 (two) times daily., Disp: 15 g, Rfl: 0 .  triamterene-hydrochlorothiazide (MAXZIDE-25) 37.5-25 MG tablet, TAKE 1 TABLET BY MOUTH EVERY DAY, Disp: 30 tablet, Rfl: 0 .  zolpidem (AMBIEN) 5 MG tablet, , Disp: , Rfl:   No Known Allergies   Review of Systems   Pertinent items are noted in the HPI. Otherwise, a complete ROS is negative.  Vitals:   Vitals:   04/13/18 0837  BP: 130/82  Pulse: 100  Temp: 98.6 F (37 C)  TempSrc: Oral  SpO2: 97%  Weight: 186 lb 9.6 oz (84.6  kg)  Height: 5\' 4"  (1.626 m)     Body mass index is 32.03 kg/m.  Physical Exam:   Physical Exam Vitals signs and nursing note reviewed.  HENT:     Head: Normocephalic and atraumatic.  Eyes:     Pupils: Pupils are equal, round, and reactive to light.  Neck:     Musculoskeletal: Normal range of motion and neck supple.  Cardiovascular:     Rate and Rhythm: Normal rate and regular rhythm.     Heart sounds: Normal heart sounds.  Pulmonary:     Effort: Pulmonary effort is normal.  Abdominal:     Palpations: Abdomen is soft.  Musculoskeletal:       Legs:  Skin:    General: Skin is warm.  Psychiatric:        Behavior: Behavior normal.     Results for orders placed or performed in visit on 04/13/18  TSH  Result Value Ref Range   TSH 0.91 0.35 - 4.50 uIU/mL  Comprehensive metabolic panel  Result Value Ref Range   Sodium 139 135 - 145 mEq/L   Potassium 4.4 3.5 - 5.1 mEq/L   Chloride 102 96 - 112 mEq/L   CO2 28 19 - 32 mEq/L   Glucose, Bld 98 70 - 99 mg/dL   BUN 19 6 - 23 mg/dL   Creatinine, Ser 0.93 0.40 - 1.20 mg/dL   Total Bilirubin 0.9 0.2 - 1.2 mg/dL   Alkaline Phosphatase 79 39 - 117 U/L   AST 36 0 - 37 U/L   ALT 49 (H) 0 - 35 U/L   Total Protein 7.4 6.0 - 8.3 g/dL   Albumin 4.4 3.5 - 5.2 g/dL   Calcium 10.0 8.4 - 10.5 mg/dL   GFR 66.79 >60.00 mL/min  Vitamin B12  Result Value Ref Range   Vitamin B-12 628 211 - 911 pg/mL  VITAMIN D 25 Hydroxy (Vit-D Deficiency, Fractures)  Result Value Ref Range   VITD 31.96 30.00 - 100.00 ng/mL    Assessment and Plan:   [name] [name] was seen today for follow-up.  Diagnoses and all orders for this visit:  Hypertension, unspecified type -     Comprehensive metabolic panel  Hepatic adenoma  Abnormal LFTs Comments: With Hx of liver changes. Not clear re: last images. Will get records and check chart thoroughly, but may need new imaging.   Malaise and fatigue -     TSH -     Vitamin B12  Vitamin D deficiency -      VITAMIN D 25 Hydroxy (Vit-D Deficiency, Fractures)  Gastroesophageal reflux disease without esophagitis  Obesity (BMI 30.0-34.9) -     phentermine (ADIPEX-P) 37.5 MG tablet; Take 1 tablet (37.5 mg total) by  mouth daily before breakfast.  Primary insomnia -     zolpidem (AMBIEN) 5 MG tablet; Take 1 tablet (5 mg total) by mouth at bedtime.  Travel advice encounter -     Baclofen 5 MG TABS; Take 5 mg by mouth at bedtime.    . Orders and follow up as documented in [place], reviewed diet, exercise and weight control, cardiovascular risk and specific lipid/LDL goals reviewed, reviewed medications and side effects in detail.  . Reviewed expectations re: course of current medical issues. . Outlined signs and symptoms indicating need for more acute intervention. . Patient verbalized understanding and all questions were answered. . Patient received an After Visit Summary.  CMA served as Education administrator during this visit. History, Physical, and Plan performed by medical provider. The above documentation has been reviewed and is accurate and complete. [name] [name], D.O.  [name] Deutscher, DO [place] 04/14/2018

## 2018-04-14 ENCOUNTER — Encounter: Payer: Self-pay | Admitting: Family Medicine

## 2018-04-14 DIAGNOSIS — E559 Vitamin D deficiency, unspecified: Secondary | ICD-10-CM | POA: Insufficient documentation

## 2018-04-14 DIAGNOSIS — E669 Obesity, unspecified: Secondary | ICD-10-CM | POA: Insufficient documentation

## 2018-04-14 DIAGNOSIS — K219 Gastro-esophageal reflux disease without esophagitis: Secondary | ICD-10-CM | POA: Insufficient documentation

## 2018-04-14 DIAGNOSIS — G43909 Migraine, unspecified, not intractable, without status migrainosus: Secondary | ICD-10-CM | POA: Insufficient documentation

## 2018-04-14 DIAGNOSIS — F5101 Primary insomnia: Secondary | ICD-10-CM | POA: Insufficient documentation

## 2018-04-15 ENCOUNTER — Ambulatory Visit
Admission: RE | Admit: 2018-04-15 | Discharge: 2018-04-15 | Disposition: A | Discharge status: Home / Self Care | Payer: 59 | Source: Ambulatory Visit | Attending: Orthopedic Surgery | Admitting: Orthopedic Surgery

## 2018-04-15 DIAGNOSIS — M1711 Unilateral primary osteoarthritis, right knee: Secondary | ICD-10-CM | POA: Diagnosis not present

## 2018-04-15 DIAGNOSIS — R609 Edema, unspecified: Secondary | ICD-10-CM

## 2018-04-15 DIAGNOSIS — R52 Pain, unspecified: Secondary | ICD-10-CM

## 2018-04-15 DIAGNOSIS — R531 Weakness: Secondary | ICD-10-CM

## 2018-04-15 MED ORDER — GADOBENATE DIMEGLUMINE 529 MG/ML IV SOLN
18.0000 mL | Freq: Once | INTRAVENOUS | Status: AC | PRN
  Administered 2018-04-15: 18 mL via INTRAVENOUS

## 2018-04-16 ENCOUNTER — Other Ambulatory Visit: Payer: Self-pay | Admitting: Family Medicine

## 2018-04-18 ENCOUNTER — Encounter: Payer: Self-pay | Admitting: Family Medicine

## 2018-04-19 ENCOUNTER — Other Ambulatory Visit: Payer: Self-pay

## 2018-04-19 DIAGNOSIS — R7989 Other specified abnormal findings of blood chemistry: Secondary | ICD-10-CM

## 2018-04-19 DIAGNOSIS — R945 Abnormal results of liver function studies: Secondary | ICD-10-CM

## 2018-04-19 MED ORDER — TRIAMTERENE-HCTZ 37.5-25 MG PO TABS: 1.0000 | ORAL_TABLET | Freq: Every day | ORAL | 1 refills | Status: DC

## 2018-05-12 ENCOUNTER — Other Ambulatory Visit: Payer: Self-pay | Admitting: Family Medicine

## 2018-05-22 DIAGNOSIS — M25561 Pain in right knee: Secondary | ICD-10-CM | POA: Diagnosis not present

## 2018-05-22 DIAGNOSIS — G8929 Other chronic pain: Secondary | ICD-10-CM | POA: Diagnosis not present

## 2018-06-12 ENCOUNTER — Other Ambulatory Visit: Payer: Self-pay | Admitting: Family Medicine

## 2018-06-18 DIAGNOSIS — Z01419 Encounter for gynecological examination (general) (routine) without abnormal findings: Secondary | ICD-10-CM | POA: Diagnosis not present

## 2018-06-18 DIAGNOSIS — Z124 Encounter for screening for malignant neoplasm of cervix: Secondary | ICD-10-CM | POA: Diagnosis not present

## 2018-06-18 LAB — HM PAP SMEAR

## 2018-06-18 MED FILL — AZITHROMYCIN 250 MG TABLET: 250 | 5 days supply | Qty: 6 | Fill #0

## 2018-07-02 DIAGNOSIS — H16223 Keratoconjunctivitis sicca, not specified as Sjogren's, bilateral: Secondary | ICD-10-CM | POA: Diagnosis not present

## 2018-07-31 ENCOUNTER — Telehealth: Payer: Self-pay | Admitting: Family Medicine

## 2018-07-31 DIAGNOSIS — F5101 Primary insomnia: Secondary | ICD-10-CM

## 2018-07-31 NOTE — Telephone Encounter (Signed)
Last OV 04/13/2018 Last refill 04/13/2018 #30/0 Next OV not scheduled

## 2018-07-31 NOTE — Telephone Encounter (Signed)
Called pt and left VM to call the office to schedule virtual visit for med f/u.

## 2018-07-31 NOTE — Telephone Encounter (Signed)
Offer doxy.me for tomorrow.

## 2018-08-02 NOTE — Telephone Encounter (Signed)
Called patient app made

## 2018-08-05 NOTE — Progress Notes (Signed)
Virtual Visit via Video   I connected with [name] [name] on 08/06/18 at 10:20 AM EDT by a video enabled telemedicine application and verified that I am speaking with the correct person using two identifiers. Location patient: Home Location provider: [facility], Office Persons participating in the virtual visit: [name] [name], [name] Deutscher, DO [name] [name], CMA acting as scribe for Dr. Briscoe [name].   I discussed the limitations of evaluation and management by telemedicine and the availability of in person appointments. The patient expressed understanding and agreed to proceed.  Subjective:   HPI: Patient needed to have refills sent in for Ambien. She is taking every other night. She is taking 1/2 tab as needed. She is able to sleep around 8 hrs a night when she is able to take. She has been afraid to take daily sue to dependency issues. She was advised that she is only taking 1/2 dose and it is much more important to get sleep.   Obesity: She is taking Phenetamine 1/2 to 1 tab daily. She is down three more pounds from last visit. Exercise is hard due to knee pain . She is trying to make sure that she is making healthy choices for eating.    She has knee surgery scheduled in May.   Reviewed all precautions and expectations with prevention of Covid-19. 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  ROS: See pertinent positives and negatives per HPI.  Patient Active Problem List   Diagnosis Date Noted  . Migraines 04/14/2018  . Obesity (BMI 30.0-34.9) 04/14/2018  . Vitamin D deficiency 04/14/2018  . Primary insomnia 04/14/2018  . GERD (gastroesophageal reflux disease)   . Menopausal syndrome 04/13/2018  . S/P right knee arthroscopy 02/22/2017  . Chronic pansinusitis 07/06/2016  . 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 Hypertension 01/23/2016  . OSA (obstructive sleep apnea) 06/22/2007  . Abnormal LFTs 06/22/2007  .  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 Smoking status: Never Smoker  . Smokeless tobacco: Never Used  Substance Use Topics  . Alcohol use: Yes    Alcohol/week: 0.0 standard drinks    Comment: occas    Current Outpatient Medications:  .  Biotin 5000 MCG TABS, , Disp: , Rfl:  .  fluticasone (FLONASE) 50 MCG/ACT nasal spray, Place 2 sprays into both nostrils daily., Disp: , Rfl:  .  phentermine (ADIPEX-P) 37.5 MG tablet, Take 1 tablet (37.5 mg total) by mouth daily before breakfast., Disp: 30 tablet, Rfl: 2 .  triamterene-hydrochlorothiazide (MAXZIDE-25) 37.5-25 MG tablet, Take 1 tablet by mouth daily., Disp: 90 tablet, Rfl: 1 .  zolpidem (AMBIEN) 5 MG tablet, Take 1 tablet (5 mg total) by mouth at bedtime., Disp: 30 tablet, Rfl: 3  No Known Allergies  Objective:   VITALS: Per patient if applicable, see vitals. GENERAL: Alert, appears well and in no acute distress. HEENT: Atraumatic, conjunctiva clear, no obvious abnormalities on inspection of external nose and ears. NECK: Normal movements of the head and neck. CARDIOPULMONARY: No increased WOB. Speaking in clear sentences. I:E ratio WNL.  MS: Moves all visible extremities without noticeable abnormality. PSYCH: Pleasant and cooperative, well-groomed. Speech normal rate and rhythm. Affect is appropriate. Insight and judgement are appropriate. Attention is focused, linear, and appropriate.  NEURO: CN grossly intact. Oriented as arrived to appointment on time with no prompting. Moves both UE equally.  SKIN: No obvious lesions,  wounds, erythema, or cyanosis noted on face or hands.  Assessment and Plan:   [name] [name] was seen today for medication refill.  Diagnoses and all orders for this visit:  Primary insomnia Comments: Stable on current medication. Continue.  Orders: -     zolpidem (AMBIEN) 5 MG tablet; Take 1 tablet (5 mg total) by mouth at bedtime.  Obesity (BMI 30.0-34.9) Comments: Continue healthy food  choices. Not exercising due to knee pain. Orders: -     phentermine (ADIPEX-P) 37.5 MG tablet; Take 1 tablet (37.5 mg total) by mouth daily before breakfast.  Abnormal LFTs Comments: [place] has been ordered. Will obtain when COVID calms. No pain, jaundice.  Essential hypertension Comments: Stable.  Chronic pain of left knee Comments: Partial replacement scheduled for May.   . Reviewed expectations re: course of current medical issues. . Discussed self-management of symptoms. . Outlined signs and symptoms indicating need for more acute intervention. . Patient verbalized understanding and all questions were answered. Marland Kitchen Health Maintenance issues including appropriate healthy diet, exercise, and smoking avoidance were discussed with patient. . See orders for this visit as documented in the electronic medical record.  [name] Deutscher, DO 08/06/2018

## 2018-08-06 ENCOUNTER — Encounter: Payer: Self-pay | Admitting: Family Medicine

## 2018-08-06 ENCOUNTER — Other Ambulatory Visit: Payer: Self-pay

## 2018-08-06 ENCOUNTER — Ambulatory Visit (INDEPENDENT_AMBULATORY_CARE_PROVIDER_SITE_OTHER): Payer: 59 | Admitting: Family Medicine

## 2018-08-06 VITALS — Ht 64.0 in | Wt 183.2 lb

## 2018-08-06 DIAGNOSIS — R7989 Other specified abnormal findings of blood chemistry: Secondary | ICD-10-CM

## 2018-08-06 DIAGNOSIS — F5101 Primary insomnia: Secondary | ICD-10-CM | POA: Diagnosis not present

## 2018-08-06 DIAGNOSIS — I10 Essential (primary) hypertension: Secondary | ICD-10-CM

## 2018-08-06 DIAGNOSIS — G8929 Other chronic pain: Secondary | ICD-10-CM

## 2018-08-06 DIAGNOSIS — M25562 Pain in left knee: Secondary | ICD-10-CM

## 2018-08-06 DIAGNOSIS — R945 Abnormal results of liver function studies: Secondary | ICD-10-CM

## 2018-08-06 DIAGNOSIS — E669 Obesity, unspecified: Secondary | ICD-10-CM | POA: Diagnosis not present

## 2018-08-06 MED ORDER — ZOLPIDEM TARTRATE 5 MG PO TABS: 5.0000 mg | ORAL_TABLET | Freq: Every day | ORAL | 3 refills | Status: DC

## 2018-08-06 MED ORDER — PHENTERMINE HCL 37.5 MG PO TABS: 37.5000 mg | ORAL_TABLET | Freq: Every day | ORAL | 2 refills | Status: DC

## 2018-08-06 MED FILL — PHENTERMINE 37.5 MG TABLET: 37.5 | 30 days supply | Qty: 30 | Fill #0

## 2018-08-06 MED FILL — ZOLPIDEM TARTRATE 5 MG TAB: 5 | 30 days supply | Qty: 30 | Fill #0

## 2018-08-18 DIAGNOSIS — J0100 Acute maxillary sinusitis, unspecified: Secondary | ICD-10-CM | POA: Diagnosis not present

## 2018-08-27 ENCOUNTER — Encounter: Payer: Self-pay | Admitting: Family Medicine

## 2018-08-27 ENCOUNTER — Telehealth: Payer: Self-pay

## 2018-08-27 ENCOUNTER — Ambulatory Visit (INDEPENDENT_AMBULATORY_CARE_PROVIDER_SITE_OTHER): Payer: 59 | Admitting: Family Medicine

## 2018-08-27 ENCOUNTER — Other Ambulatory Visit: Payer: Self-pay

## 2018-08-27 DIAGNOSIS — R6883 Chills (without fever): Secondary | ICD-10-CM

## 2018-08-27 DIAGNOSIS — M25562 Pain in left knee: Secondary | ICD-10-CM | POA: Diagnosis not present

## 2018-08-27 DIAGNOSIS — Z01818 Encounter for other preprocedural examination: Secondary | ICD-10-CM

## 2018-08-27 DIAGNOSIS — G8929 Other chronic pain: Secondary | ICD-10-CM | POA: Diagnosis not present

## 2018-08-27 DIAGNOSIS — Z7189 Other specified counseling: Secondary | ICD-10-CM

## 2018-08-27 NOTE — Progress Notes (Signed)
Virtual Visit via Video   Due to the COVID-19 pandemic, this visit was completed with telemedicine (audio/video) technology to reduce patient and provider exposure as well as to preserve personal protective equipment.   I connected with [name] [name] by a video enabled telemedicine application and verified that I am speaking with the correct person using two identifiers. Location patient: Home Location provider: [name], Office Persons participating in the virtual visit: [name], [name] Deutscher, DO   I discussed the limitations of evaluation and management by telemedicine and the availability of in person appointments. The patient expressed understanding and agreed to proceed.  Care Team   Patient Care Team: [name] Deutscher, DO as PCP - General (Family Medicine)  Subjective:   Patient scheduled for partial knee replacement next Wednesday. Had sinusitis last week, s/p Abx and sinus pressure/otalgia resolved. Still with PND, but also complaining of subjective fever/chills that are constant.  Review of Systems  Constitutional: Positive for chills. Negative for fever, malaise/fatigue and weight loss.  Respiratory: Negative for cough, shortness of breath and wheezing.   Cardiovascular: Negative for chest pain, palpitations and leg swelling.  Gastrointestinal: Negative for abdominal pain, constipation, diarrhea, nausea and vomiting.  Genitourinary: Negative for dysuria and urgency.  Musculoskeletal: Negative for joint pain and myalgias.  Skin: Negative for rash.  Neurological: Negative for dizziness and headaches.  Psychiatric/Behavioral: Negative for depression, substance abuse and suicidal ideas. The patient is not nervous/anxious.     Patient Active Problem List   Diagnosis Date Noted  . Migraines 04/14/2018  . Obesity (BMI 30.0-34.9) 04/14/2018  . Vitamin D deficiency 04/14/2018  . Primary insomnia 04/14/2018  . GERD (gastroesophageal reflux disease)   .  Menopausal syndrome 04/13/2018  . S/P right knee arthroscopy 02/22/2017  . Chronic pansinusitis 07/06/2016  . 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 Hypertension 01/23/2016  . OSA (obstructive sleep apnea) 06/22/2007  . Abnormal LFTs 06/22/2007  . 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 Smoking status: Never Smoker  . Smokeless tobacco: Never Used  Substance Use Topics  . Alcohol use: Yes    Alcohol/week: 0.0 standard drinks    Comment: occas    Current Outpatient Medications:  .  Biotin 5000 MCG TABS, , Disp: , Rfl:  .  fluticasone (FLONASE) 50 MCG/ACT nasal spray, Place 2 sprays into both nostrils daily., Disp: , Rfl:  .  phentermine (ADIPEX-P) 37.5 MG tablet, Take 1 tablet (37.5 mg total) by mouth daily before breakfast., Disp: 30 tablet, Rfl: 2 .  triamterene-hydrochlorothiazide (MAXZIDE-25) 37.5-25 MG tablet, Take 1 tablet by mouth daily., Disp: 90 tablet, Rfl: 1 .  zolpidem (AMBIEN) 5 MG tablet, Take 1 tablet (5 mg total) by mouth at bedtime., Disp: 30 tablet, Rfl: 3  No Known Allergies  Objective:   VITALS: Per patient if applicable, see vitals. GENERAL: Alert, appears well and in no acute distress. HEENT: Atraumatic, conjunctiva clear, no obvious abnormalities on inspection of external nose and ears. NECK: Normal movements of the head and neck. CARDIOPULMONARY: No increased WOB. Speaking in clear sentences. I:E ratio WNL.  MS: Moves all visible extremities without noticeable abnormality. PSYCH: Pleasant and cooperative, well-groomed. Speech normal rate and rhythm. Affect is appropriate. Insight and judgement are appropriate. Attention is focused, linear, and appropriate.  NEURO: CN grossly intact. Oriented as arrived to appointment on time with no prompting. Moves both UE equally.  SKIN: No obvious lesions, wounds, erythema, or cyanosis noted on face or  hands.  Depression screen [facility] 2/9 04/13/2018 08/21/2017 06/01/2016  Decreased Interest 0 0 0    Down, Depressed, Hopeless 0 0 0  PHQ - 2 Score 0 0 0  Altered sleeping 0 - -  Tired, decreased energy 0 - -  Change in appetite 0 - -  Feeling bad or failure about yourself  0 - -  Trouble concentrating 0 - -  Moving slowly or fidgety/restless 0 - -  Suicidal thoughts 0 - -  PHQ-9 Score 0 - -  Difficult doing work/chores Not difficult at all - -    Assessment and Plan:   [name] was seen today for follow-up.  Diagnoses and all orders for this visit:  Chills without fever Comments: Will call surgeon to see if they want COVID-19 testing prior to next week.   Chronic pain of left knee  Educated About Covid-19 Virus Infection    . COVID-19 Education: The signs and symptoms of COVID-19 were discussed with the patient and how to seek care for testing if needed. The importance of social distancing was discussed today. . Reviewed expectations re: course of current medical issues. . Discussed self-management of symptoms. . Outlined signs and symptoms indicating need for more acute intervention. . Patient verbalized understanding and all questions were answered. Marland Kitchen Health Maintenance issues including appropriate healthy diet, exercise, and smoking avoidance were discussed with patient. . See orders for this visit as documented in the electronic medical record.  [name] Deutscher, DO  Records requested if needed. Time spent: 25 minutes, of which >50% was spent in obtaining information about her symptoms, reviewing her previous labs, evaluations, and treatments, counseling her about her condition (please see the discussed topics above), and developing a plan to further investigate it; she had a number of questions which I addressed.

## 2018-08-27 NOTE — Telephone Encounter (Signed)
Pt scheduled for partial knee replacement next week.  Having subjective fever/chills.   Called Dr. Ruel Favors office and left VM for Kim to call me back regarding sx and upcoming surgery.

## 2018-08-27 NOTE — Patient Instructions (Signed)

## 2018-08-28 ENCOUNTER — Encounter: Payer: Self-pay | Admitting: Family Medicine

## 2018-08-28 NOTE — Addendum Note (Signed)
Addended by: Jasper Loser on: 08/28/2018 03:04 PM   Modules accepted: Orders

## 2018-08-28 NOTE — Telephone Encounter (Signed)
Orders have been placed. Called pt and scheduled lab visit for tomorrow.

## 2018-08-28 NOTE — Telephone Encounter (Signed)
Yes, okay for CBC, CMP, COVID test [id number]. Will need to be drawn at car.

## 2018-08-28 NOTE — Telephone Encounter (Signed)
Called Dr. Ruel Favors office. They will need to have PA Carlyon Shadow give me a call back later today to address this issue. Will await return call.

## 2018-08-28 NOTE — Addendum Note (Signed)
Addended by: Jasper Loser on: 08/28/2018 03:01 PM   Modules accepted: Orders

## 2018-08-28 NOTE — Telephone Encounter (Addendum)
PA Kathlyn [place] returned call and advised that his preference would be for Dr. Juleen China to order Covid-19 antibody testing prior to scheduled surgery next week. Report results to patient and have pt contact ortho based on results and possibility of need to r/s.   Lab order pending, OK to order and contact pt for lab draw for tomorrow?.   Per MyChart note from patient, they are also requesting pre-op lab work to be done at our office: CBC w/ diff and CMP

## 2018-08-28 NOTE — Addendum Note (Signed)
Addended by: Jasper Loser on: 08/28/2018 04:35 PM   Modules accepted: Orders

## 2018-08-29 ENCOUNTER — Other Ambulatory Visit (INDEPENDENT_AMBULATORY_CARE_PROVIDER_SITE_OTHER): Payer: 59

## 2018-08-29 DIAGNOSIS — R6883 Chills (without fever): Secondary | ICD-10-CM

## 2018-08-29 DIAGNOSIS — Z01818 Encounter for other preprocedural examination: Secondary | ICD-10-CM

## 2018-08-29 DIAGNOSIS — Z7189 Other specified counseling: Secondary | ICD-10-CM

## 2018-08-29 LAB — COMPREHENSIVE METABOLIC PANEL
ALT: 35 U/L (ref 0–35)
AST: 28 U/L (ref 0–37)
Albumin: 4 g/dL (ref 3.5–5.2)
Alkaline Phosphatase: 88 U/L (ref 39–117)
BUN: 18 mg/dL (ref 6–23)
CO2: 29 mEq/L (ref 19–32)
Calcium: 9.2 mg/dL (ref 8.4–10.5)
Chloride: 102 mEq/L (ref 96–112)
Creatinine, Ser: 0.7 mg/dL (ref 0.40–1.20)
GFR: 87.1 mL/min (ref >60.00)
Glucose, Bld: 89 mg/dL (ref 70–99)
Potassium: 3.8 mEq/L (ref 3.5–5.1)
Sodium: 139 mEq/L (ref 135–145)
Total Bilirubin: 0.9 mg/dL (ref 0.2–1.2)
Total Protein: 6.8 g/dL (ref 6.0–8.3)

## 2018-08-29 LAB — CBC WITH DIFFERENTIAL/PLATELET
Basophils Absolute: 0.1 10*3/uL (ref 0.0–0.1)
Basophils Relative: 1.1 % (ref 0.0–3.0)
Eosinophils Absolute: 0.2 10*3/uL (ref 0.0–0.7)
Eosinophils Relative: 4 % (ref 0.0–5.0)
HCT: 40.7 % (ref 36.0–46.0)
Hemoglobin: 13.9 g/dL (ref 12.0–15.0)
Lymphocytes Relative: 33.4 % (ref 12.0–46.0)
Lymphs Abs: 2 10*3/uL (ref 0.7–4.0)
MCHC: 34.2 g/dL (ref 30.0–36.0)
MCV: 89.2 fl (ref 78.0–100.0)
Monocytes Absolute: 0.5 10*3/uL (ref 0.1–1.0)
Monocytes Relative: 7.8 % (ref 3.0–12.0)
Neutro Abs: 3.3 10*3/uL (ref 1.4–7.7)
Neutrophils Relative %: 53.7 % (ref 43.0–77.0)
Platelets: 214 10*3/uL (ref 150.0–400.0)
RBC: 4.56 Mil/uL (ref 3.87–5.11)
RDW: 12.4 % (ref 11.5–15.5)
WBC: 6.1 10*3/uL (ref 4.0–10.5)

## 2018-08-30 DIAGNOSIS — M25461 Effusion, right knee: Secondary | ICD-10-CM | POA: Diagnosis not present

## 2018-08-30 DIAGNOSIS — M25561 Pain in right knee: Secondary | ICD-10-CM | POA: Diagnosis not present

## 2018-08-30 DIAGNOSIS — G8929 Other chronic pain: Secondary | ICD-10-CM | POA: Diagnosis not present

## 2018-08-30 LAB — SAR COV2 SEROLOGY (COVID19)AB(IGG),IA: SARS CoV2 AB IGG: NEGATIVE

## 2018-09-04 ENCOUNTER — Other Ambulatory Visit: Payer: Self-pay | Admitting: Family Medicine

## 2018-09-04 NOTE — Telephone Encounter (Signed)
Last med check 08/06/2018 Last OV 08/27/18 Last refill 04/19/2018 #90/1 Next OV not scheduled

## 2018-09-05 DIAGNOSIS — M1711 Unilateral primary osteoarthritis, right knee: Secondary | ICD-10-CM | POA: Diagnosis not present

## 2018-09-05 MED FILL — ASPIRIN EC 325 MG TABLET: 325 | 15 days supply | Qty: 30 | Fill #0

## 2018-09-05 MED FILL — oxyCODONE HCL 5 MG TABS: 5 | 7 days supply | Qty: 40 | Fill #0

## 2018-09-05 MED FILL — METHOCARBAMOL 500 MG TABLET: 500 | 10 days supply | Qty: 80 | Fill #0

## 2018-09-07 DIAGNOSIS — M25561 Pain in right knee: Secondary | ICD-10-CM | POA: Diagnosis not present

## 2018-09-07 DIAGNOSIS — M25661 Stiffness of right knee, not elsewhere classified: Secondary | ICD-10-CM | POA: Diagnosis not present

## 2018-09-07 DIAGNOSIS — R262 Difficulty in walking, not elsewhere classified: Secondary | ICD-10-CM | POA: Diagnosis not present

## 2018-09-11 DIAGNOSIS — R262 Difficulty in walking, not elsewhere classified: Secondary | ICD-10-CM | POA: Diagnosis not present

## 2018-09-11 DIAGNOSIS — M25661 Stiffness of right knee, not elsewhere classified: Secondary | ICD-10-CM | POA: Diagnosis not present

## 2018-09-11 DIAGNOSIS — M25561 Pain in right knee: Secondary | ICD-10-CM | POA: Diagnosis not present

## 2018-09-13 DIAGNOSIS — M25661 Stiffness of right knee, not elsewhere classified: Secondary | ICD-10-CM | POA: Diagnosis not present

## 2018-09-13 DIAGNOSIS — R29898 Other symptoms and signs involving the musculoskeletal system: Secondary | ICD-10-CM | POA: Diagnosis not present

## 2018-09-13 DIAGNOSIS — M25561 Pain in right knee: Secondary | ICD-10-CM | POA: Diagnosis not present

## 2018-10-05 ENCOUNTER — Encounter: Payer: Self-pay | Admitting: Physician Assistant

## 2018-10-05 ENCOUNTER — Ambulatory Visit (INDEPENDENT_AMBULATORY_CARE_PROVIDER_SITE_OTHER): Payer: 59 | Admitting: Physician Assistant

## 2018-10-05 ENCOUNTER — Other Ambulatory Visit: Payer: Self-pay

## 2018-10-05 DIAGNOSIS — J324 Chronic pansinusitis: Secondary | ICD-10-CM

## 2018-10-05 MED ORDER — AMOXICILLIN-POT CLAVULANATE 875-125 MG PO TABS: 1.0000 | ORAL_TABLET | Freq: Two times a day (BID) | ORAL | 0 refills | Status: DC

## 2018-10-05 NOTE — Progress Notes (Signed)
Virtual Visit via Video   I connected with [name] [name] on 10/05/18 at 10:40 AM EDT by a video enabled telemedicine application and verified that I am speaking with the correct person using two identifiers. Location patient: Home Location provider: [name] Persons participating in the virtual visit: [name] [name], [name], [name] Pickler, [name] [name]   I discussed the limitations of evaluation and management by telemedicine and the availability of in person appointments. The patient expressed understanding and agreed to proceed.  I acted as a Education administrator for [name] [name], [name], [name]  Subjective:   HPI:  Sinus problem Pt c/o sinuses pressure and headache x 10 days. Also starting to have some right ear discomfort. Denies fever, cough, SOB, chest pain, COVID-19 exposures, nasal discharge. She does have some post nasal drip. Has tried Sudafed and Flonase with no relief.   ROS: See pertinent positives and negatives per HPI.  Patient Active Problem List   Diagnosis Date Noted  . Migraines 04/14/2018  . Obesity (BMI 30.0-34.9) 04/14/2018  . Vitamin D deficiency 04/14/2018  . Primary insomnia 04/14/2018  . GERD (gastroesophageal reflux disease)   . Menopausal syndrome 04/13/2018  . S/P right knee arthroscopy 02/22/2017  . Chronic pansinusitis 07/06/2016  . 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 Hypertension 01/23/2016  . OSA (obstructive sleep apnea) 06/22/2007  . Abnormal LFTs 06/22/2007  . 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 Smoking status: Never Smoker  . Smokeless tobacco: Never Used  Substance Use Topics  . Alcohol use: Yes    Alcohol/week: 0.0 standard drinks    Comment: occas    Current Outpatient Medications:  .  Biotin 5000 MCG TABS, , Disp: , Rfl:  .  fluticasone (FLONASE) 50 MCG/ACT nasal spray, Place 2 sprays into both nostrils daily., Disp: , Rfl:  .  triamterene-hydrochlorothiazide  (MAXZIDE-25) 37.5-25 MG tablet, TAKE 1 TABLET BY MOUTH EVERY DAY, Disp: 90 tablet, Rfl: 1 .  zolpidem (AMBIEN) 5 MG tablet, Take 1 tablet (5 mg total) by mouth at bedtime., Disp: 30 tablet, Rfl: 3 .  amoxicillin-clavulanate (AUGMENTIN) 875-125 MG tablet, Take 1 tablet by mouth 2 (two) times daily., Disp: 20 tablet, Rfl: 0  No Known Allergies  Objective:   VITALS: Per patient if applicable, see vitals. GENERAL: Alert, appears well and in no acute distress. HEENT: Atraumatic, conjunctiva clear, no obvious abnormalities on inspection of external nose and ears. NECK: Normal movements of the head and neck. CARDIOPULMONARY: No increased WOB. Speaking in clear sentences. I:E ratio WNL.  MS: Moves all visible extremities without noticeable abnormality. PSYCH: Pleasant and cooperative, well-groomed. Speech normal rate and rhythm. Affect is appropriate. Insight and judgement are appropriate. Attention is focused, linear, and appropriate.  NEURO: CN grossly intact. Oriented as arrived to appointment on time with no prompting. Moves both UE equally.  SKIN: No obvious lesions, wounds, erythema, or cyanosis noted on face or hands.  Assessment and Plan:   [name] was seen today for sinus problem.  Diagnoses and all orders for this visit:  Chronic pansinusitis  Other orders -     amoxicillin-clavulanate (AUGMENTIN) 875-125 MG tablet; Take 1 tablet by mouth 2 (two) times daily.   No red flags on discussion. Start Augmentin per orders. Continue flonase, push fluids. Follow-up precautions advised.  . Reviewed expectations re: course of current medical issues. . Discussed self-management of symptoms. . Outlined signs and symptoms indicating need for more acute intervention. Marland Kitchen  Patient verbalized understanding and all questions were answered. Marland Kitchen Health Maintenance issues including appropriate healthy diet, exercise, and smoking avoidance were discussed with patient. . See orders for this visit as  documented in the electronic medical record.  I discussed the assessment and treatment plan with the patient. The patient was provided an opportunity to ask questions and all were answered. The patient agreed with the plan and demonstrated an understanding of the instructions.   The patient was advised to call back or seek an in-person evaluation if the symptoms worsen or if the condition fails to improve as anticipated.   CMA or [name] served as scribe during this visit. History, Physical, and Plan performed by medical provider. The above documentation has been reviewed and is accurate and complete.   [name] [name], [place] 10/05/2018

## 2018-11-06 ENCOUNTER — Other Ambulatory Visit: Payer: Self-pay | Admitting: Orthopedic Surgery

## 2018-11-06 DIAGNOSIS — R609 Edema, unspecified: Secondary | ICD-10-CM

## 2018-11-08 ENCOUNTER — Ambulatory Visit
Admission: RE | Admit: 2018-11-08 | Discharge: 2018-11-08 | Disposition: A | Discharge status: Home / Self Care | Payer: 59 | Source: Ambulatory Visit | Attending: Orthopedic Surgery | Admitting: Orthopedic Surgery

## 2018-11-08 DIAGNOSIS — R609 Edema, unspecified: Secondary | ICD-10-CM

## 2018-11-14 ENCOUNTER — Telehealth: Payer: Self-pay | Admitting: Family Medicine

## 2018-11-14 NOTE — Telephone Encounter (Signed)
Pt called in reference to a call she got from "[facility]" for labs. Pt stated she never received a bill for these labs and the caller stated she is now being sent to collections. Pt labs were done 08/21/17 and pt  wanted to know if there was a possibility that the labs got sent out somewhere else for a second reading since we don't use [facility]. Pt already called billing and they told her to speak with [place] to see if a nurse could tell her if we sent the labs elsewhere. Please advise.

## 2018-11-16 NOTE — Telephone Encounter (Signed)
Called l/m to call office  

## 2018-11-22 MED FILL — ZOLPIDEM TARTRATE 5 MG TAB: 5 | 30 days supply | Qty: 30 | Fill #1

## 2018-11-22 MED FILL — PHENTERMINE 37.5 MG TABLET: 37.5 | 30 days supply | Qty: 30 | Fill #1

## 2018-12-20 ENCOUNTER — Telehealth: Payer: 59 | Admitting: Physician Assistant

## 2018-12-20 DIAGNOSIS — J019 Acute sinusitis, unspecified: Secondary | ICD-10-CM

## 2018-12-20 MED ORDER — AMOXICILLIN-POT CLAVULANATE 875-125 MG PO TABS: 1.0000 | ORAL_TABLET | Freq: Two times a day (BID) | ORAL | 0 refills | Status: AC

## 2018-12-20 NOTE — Progress Notes (Signed)
We are sorry that you are not feeling well.  Here is how we plan to help!  Based on what you have shared with me it looks like you have sinusitis.  Sinusitis is inflammation and infection in the sinus cavities of the head.  Based on your presentation I believe you most likely have Acute Bacterial Sinusitis.  This is an infection caused by bacteria and is treated with antibiotics. I have prescribed Augmentin 875mg /125mg  one tablet twice daily with food, for 7 days. You may use an oral decongestant such as Mucinex D or if you have glaucoma or high blood pressure use plain Mucinex. Saline nasal spray help and can safely be used as often as needed for congestion.  If you develop worsening sinus pain, fever or notice severe headache and vision changes, or if symptoms are not better after completion of antibiotic, please schedule an appointment with a health care provider.    Sinus infections are not as easily transmitted as other respiratory infection, however we still recommend that you avoid close contact with loved ones, especially the very young and elderly.  Remember to wash your hands thoroughly throughout the day as this is the number one way to prevent the spread of infection!  Home Care:  Only take medications as instructed by your medical team.  Complete the entire course of an antibiotic.  Do not take these medications with alcohol.  A steam or ultrasonic humidifier can help congestion.  You can place a towel over your head and breathe in the steam from hot water coming from a faucet.  Avoid close contacts especially the very young and the elderly.  Cover your mouth when you cough or sneeze.  Always remember to wash your hands.  Get Help Right Away If:  You develop worsening fever or sinus pain.  You develop a severe head ache or visual changes.  Your symptoms persist after you have completed your treatment plan.  Make sure you  Understand these instructions.  Will watch your  condition.  Will get help right away if you are not doing well or get worse.  Your e-visit answers were reviewed by a board certified advanced clinical practitioner to complete your personal care plan.  Depending on the condition, your plan could have included both over the counter or prescription medications.  If there is a problem please reply  once you have received a response from your provider.  Your safety is important to [place].  If you have drug allergies check your prescription carefully.    You can use MyChart to ask questions about today's visit, request a non-urgent call back, or ask for a work or school excuse for 24 hours related to this e-Visit. If it has been greater than 24 hours you will need to follow up with your provider, or enter a new e-Visit to address those concerns.  You will get an e-mail in the next two days asking about your experience.  I hope that your e-visit has been valuable and will speed your recovery. Thank you for using e-visits.  I have spent 5 minutes in review of e-visit questionnaire, review and updating patient chart, medical decision making and response to patient.    Tenna Delaine, PA-C

## 2018-12-27 MED FILL — PHENTERMINE 37.5 MG TABLET: 37.5 | 30 days supply | Qty: 30 | Fill #2

## 2018-12-27 MED FILL — ZOLPIDEM TARTRATE 5 MG TAB: 5 | 30 days supply | Qty: 30 | Fill #2

## 2019-01-03 ENCOUNTER — Encounter: Payer: Self-pay | Admitting: Physician Assistant

## 2019-01-03 ENCOUNTER — Other Ambulatory Visit: Payer: Self-pay

## 2019-01-03 ENCOUNTER — Ambulatory Visit (INDEPENDENT_AMBULATORY_CARE_PROVIDER_SITE_OTHER): Payer: 59 | Admitting: Physician Assistant

## 2019-01-03 VITALS — Ht 64.0 in | Wt 180.0 lb

## 2019-01-03 DIAGNOSIS — M7989 Other specified soft tissue disorders: Secondary | ICD-10-CM

## 2019-01-03 DIAGNOSIS — J029 Acute pharyngitis, unspecified: Secondary | ICD-10-CM

## 2019-01-03 LAB — POCT RAPID STREP A (OFFICE): Rapid Strep A Screen: NEGATIVE

## 2019-01-03 MED ORDER — NYSTATIN 100000 UNIT/ML MT SUSP: 5.0000 mL | Freq: Four times a day (QID) | OROMUCOSAL | 0 refills | Status: DC

## 2019-01-03 MED ORDER — PREDNISONE 20 MG PO TABS: 40.0000 mg | ORAL_TABLET | Freq: Every day | ORAL | 0 refills | Status: DC

## 2019-01-03 MED FILL — predniSONE 20 MG TABS: 20 | 5 days supply | Qty: 10 | Fill #0

## 2019-01-03 MED FILL — NYSTATIN 100,000 UNITS/ML S: 100000 | 3 days supply | Qty: 60 | Fill #0

## 2019-01-03 NOTE — Progress Notes (Signed)
[name] [name] is a 53 y.o. female here for a new problem.  History of Present Illness:   Chief Complaint  Patient presents with  . Mouth Lesions    HPI   Mouth lesions Pt c/o mouth burning, metallic taste for 2-3 days and sore throat. Pt looked in her mouth and the roof and tongue have a yellow coating, the back of her throat has yellow mucous and white spots. Pt is doing saline gargles. Pt did have an E-visit on 8/27 for Sinusitis and was treated with Augmentin 875 mg/125 mg for 7 days. Pt said she is still having sinus issues, headaches and pressure. Pt said she has been treated 4 times this year with antibiotics for sinus infections. Denies fever or chills. Has also tried Sudafed.  Swelling Patient had a partial right knee replacement by Dr. Ronnie Derby with [facility] in May of this year.  Since that time she has had ongoing issues with swelling in her distal quad.  Her orthopedist has ultrasound of the area, and they are unable to determine why she has the swelling.  They told her at this point, they would refer her to a Psychiatric nurse.  She is inquiring if we agree with this recommendation.  Past Medical History:  Diagnosis Date  . Arthritis   . Chicken pox   . GERD (gastroesophageal reflux disease)   . Hypertension   . Liver lesion    Hepatic Adenoma  . Migraines      Social History   Socioeconomic History  . Marital status: Married    Spouse name: Not on file  . Number of children: Not on file  . Years of education: Not on file  . Highest education level: Not on file  Occupational History  . Occupation: Research scientist (life sciences): [employer]  Social Needs  . Financial resource strain: Not on file  . Food insecurity    Worry: Not on file    Inability: Not on file  . Transportation needs    Medical: Not on file    Non-medical: Not on file  Tobacco Use  . Smoking status: Never Smoker  . Smokeless tobacco: Never Used  Substance and Sexual Activity  .  Alcohol use: Yes    Alcohol/week: 0.0 standard drinks    Comment: occas  . Drug use: No  . Sexual activity: Never  Lifestyle  . Physical activity    Days per week: Not on file    Minutes per session: Not on file  . Stress: Not on file  Relationships  . Social Herbalist on phone: Not on file    Gets together: Not on file    Attends religious service: Not on file    Active member of club or organization: Not on file    Attends meetings of clubs or organizations: Not on file    Relationship status: Not on file  . Intimate partner violence    Fear of current or ex partner: Not on file    Emotionally abused: Not on file    Physically abused: Not on file    Forced sexual activity: Not on file  Other Topics Concern  . Not on file  Social History Narrative  . Not on file    Past Surgical History:  Procedure Laterality Date  . KNEE ARTHROSCOPY Right 01/2017    Family History  Problem Relation Age of Onset  . Cancer - Lung Father   . Diabetes  Mother   . Hyperlipidemia Mother   . Cancer Maternal Grandmother   . Diabetes Maternal Grandmother   . Hyperlipidemia Maternal Grandmother   . Hypertension Maternal Grandmother   . Diabetes Maternal Grandfather   . Hyperlipidemia Maternal Grandfather   . Cancer Paternal Grandmother   . Diabetes Paternal Grandmother   . Heart disease Paternal Grandmother   . Hyperlipidemia Paternal Grandmother   . Hypertension Paternal Grandmother   . Diabetes Sister   . Hyperlipidemia Sister   . Colon cancer Neg Hx   . Colon polyps Neg Hx   . Rectal cancer Neg Hx   . Stomach cancer Neg Hx   . Breast cancer Neg Hx     No Known Allergies  Current Medications:   Current Outpatient Medications:  .  fluticasone (FLONASE) 50 MCG/ACT nasal spray, Place 2 sprays into both nostrils daily., Disp: , Rfl:  .  phentermine 37.5 MG capsule, Take 37.5 mg by mouth every morning., Disp: , Rfl:  .  triamterene-hydrochlorothiazide (MAXZIDE-25)  37.5-25 MG tablet, TAKE 1 TABLET BY MOUTH EVERY DAY, Disp: 90 tablet, Rfl: 1 .  zolpidem (AMBIEN) 5 MG tablet, Take 1 tablet (5 mg total) by mouth at bedtime., Disp: 30 tablet, Rfl: 3 .  nystatin (MYCOSTATIN) 100000 UNIT/ML suspension, Take 5 mLs (500,000 Units total) by mouth 4 (four) times daily., Disp: 60 mL, Rfl: 0 .  predniSONE (DELTASONE) 20 MG tablet, Take 2 tablets (40 mg total) by mouth daily., Disp: 10 tablet, Rfl: 0   Review of Systems:   ROS Negative unless otherwise specified per HPI.  Vitals:   Vitals:   01/03/19 0738  Weight: 180 lb (81.6 kg)  Height: 5\' 4"  (1.626 m)     Body mass index is 30.9 kg/m.  Physical Exam:   Physical Exam Constitutional:      Appearance: She is well-developed.  HENT:     Head: Normocephalic and atraumatic.     Mouth/Throat:     Comments: Slight erythema and very thin whitish/yellow-ish coating to roof of mouth and tongue.  Under tongue has 1 small white lesion on frenulum. Eyes:     Conjunctiva/sclera: Conjunctivae normal.  Neck:     Musculoskeletal: Normal range of motion and neck supple.  Pulmonary:     Effort: Pulmonary effort is normal.  Musculoskeletal: Normal range of motion.  Skin:    General: Skin is warm and dry.  Neurological:     Mental Status: She is alert and oriented to person, place, and time.  Psychiatric:        Behavior: Behavior normal.        Thought Content: Thought content normal.        Judgment: Judgment normal.     Results for orders placed or performed in visit on 01/03/19  POCT rapid strep A  Result Value Ref Range   Rapid Strep A Screen Negative Negative    Assessment and Plan:   [name] [name] was seen today for mouth lesions.  Diagnoses and all orders for this visit:  Sore throat No red flags on exam.  Strep test negative. Suspect possible thrust. Will initiate nystatin suspension per orders.  She would also like something for congestion, and we discussed trialing oral prednisone, she is  agreeable to trialing this.  Potential side effects discussed with patient.  Discussed taking medications as prescribed. Reviewed return precautions including worsening fever, SOB, worsening cough or other concerns. Push fluids and rest. I recommend that patient follow-up if symptoms worsen or persist  despite treatment x 7-10 days, sooner if needed. -     POCT rapid strep A  Symptom of leg swelling Reviewed briefly with Dr. Jerline Pain.  Will defer to [facility]'s recommendation of plastic surgery.  Recommend that referral goes through them.  Other orders -     predniSONE (DELTASONE) 20 MG tablet; Take 2 tablets (40 mg total) by mouth daily. -     nystatin (MYCOSTATIN) 100000 UNIT/ML suspension; Take 5 mLs (500,000 Units total) by mouth 4 (four) times daily.    . Reviewed expectations re: course of current medical issues. . Discussed self-management of symptoms. . Outlined signs and symptoms indicating need for more acute intervention. . Patient verbalized understanding and all questions were answered. . See orders for this visit as documented in the electronic medical record. . Patient received an After-Visit Summary.  CMA or LPN served as scribe during this visit. History, Physical, and Plan performed by medical provider. The above documentation has been reviewed and is accurate and complete.   Inda Coke, PA-C

## 2019-01-08 ENCOUNTER — Encounter: Payer: Self-pay | Admitting: Physician Assistant

## 2019-01-10 ENCOUNTER — Other Ambulatory Visit: Payer: Self-pay

## 2019-01-10 ENCOUNTER — Ambulatory Visit (INDEPENDENT_AMBULATORY_CARE_PROVIDER_SITE_OTHER): Payer: 59 | Admitting: Family Medicine

## 2019-01-10 ENCOUNTER — Ambulatory Visit: Payer: 59 | Admitting: Family Medicine

## 2019-01-10 ENCOUNTER — Encounter: Payer: Self-pay | Admitting: Family Medicine

## 2019-01-10 ENCOUNTER — Telehealth: Payer: Self-pay

## 2019-01-10 VITALS — Ht 64.0 in | Wt 182.0 lb

## 2019-01-10 DIAGNOSIS — R05 Cough: Secondary | ICD-10-CM | POA: Diagnosis not present

## 2019-01-10 DIAGNOSIS — H9201 Otalgia, right ear: Secondary | ICD-10-CM | POA: Diagnosis not present

## 2019-01-10 DIAGNOSIS — R208 Other disturbances of skin sensation: Secondary | ICD-10-CM | POA: Diagnosis not present

## 2019-01-10 DIAGNOSIS — R059 Cough, unspecified: Secondary | ICD-10-CM

## 2019-01-10 DIAGNOSIS — K1370 Unspecified lesions of oral mucosa: Secondary | ICD-10-CM

## 2019-01-10 MED ORDER — NYSTATIN 100000 UNIT/ML MT SUSP: 5.0000 mL | Freq: Four times a day (QID) | OROMUCOSAL | 0 refills | Status: DC

## 2019-01-10 MED ORDER — AZELASTINE-FLUTICASONE 137-50 MCG/ACT NA SUSP: 1.0000 | Freq: Two times a day (BID) | NASAL | 3 refills | Status: DC

## 2019-01-10 MED FILL — NYSTATIN 100,000 UNITS/ML S: 100000 | 3 days supply | Qty: 60 | Fill #0

## 2019-01-10 NOTE — Progress Notes (Signed)
Patient: [name] [name] MRN: [id number] DOB: 1965-06-12 PCP: Briscoe Deutscher, DO     I connected with Jenkins Rouge on 01/10/19 at 10:35am by a video enabled telemedicine application and verified that I am speaking with the correct person using two identifiers.  Location patient: Home Location provider: [facility], Office Persons participating in this virtual visit: Lorane Tomasino and DR. Rogers Blocker   I discussed the limitations of evaluation and management by telemedicine and the availability of in person appointments. The patient expressed understanding and agreed to proceed.   Subjective:  Chief Complaint  Patient presents with  . Sinusitis  . Thrush  . Cough    HPI: The patient is a 53 y.o. female who presents today for sinus pain and pressure, right ear pain, cough and a yellow coating on tongue and roof of mouth x 2 wks.  She just finished a course of prednisone and nystatin oral solution w/o much relief.    Brief synopsis: seen with echart visit on 8/27 and given augmentin for sinus infection. Did not improve much, so came to office and seen by Lavell Luster, PA on 9/10. Given steroid burst and nystatin swish and swallow. Finished her steroids on Tuesday. Thinks it may have helped a little bit, but symptoms returned yesterday. She has an ear ache in her right ear with hx of eustachian tube dysfunction. She has pain and pressure behind her right eye. NO nasal congestion, rhinorrhea or purulent/foul smelling nasal discharge. No fever/chills. She also has a yellow coating in her mouth/tongue and throat. It is not white and doesn't scrape off. No red base. Her mouth has also been burning for 2 weeks. She also started to develop a cough with white phelgm. NO covid contact and no sick contacts. Antibody covid test negative in may. Of note, she has been on augmentin x 4 this year for presumed sinus infections. CT of sinuses in may of 2019 showed no acute or chronic sinus issues.   Review  of Systems  Constitutional: Negative for chills and fever.  HENT: Positive for ear pain, postnasal drip, sinus pressure, sinus pain and sore throat. Negative for congestion and trouble swallowing.        C/o R ear pain  C/o yellow coating on tongue and roof of mouth and a burning sensation in her mouth  Eyes: Negative for photophobia and pain.  Respiratory: Positive for cough. Negative for shortness of breath and wheezing.   Cardiovascular: Negative for chest pain, palpitations and leg swelling.  Gastrointestinal: Negative for abdominal pain, diarrhea, nausea and vomiting.  Musculoskeletal: Negative for back pain, myalgias and neck pain.  Neurological: Positive for headaches. Negative for dizziness.  Psychiatric/Behavioral: Negative for sleep disturbance.    Allergies Patient has No Known Allergies.  Past Medical History Patient  has a past medical history of Arthritis, Chicken pox, GERD (gastroesophageal reflux disease), Hypertension, Liver lesion, and Migraines.  Surgical History Patient  has a past surgical history that includes Knee arthroscopy (Right, 01/2017).  Family History Pateint's family history includes Cancer in her maternal grandmother and paternal grandmother; Cancer - Lung in her father; Diabetes in her maternal grandfather, maternal grandmother, mother, paternal grandmother, and sister; Heart disease in her paternal grandmother; Hyperlipidemia in her maternal grandfather, maternal grandmother, mother, paternal grandmother, and sister; Hypertension in her maternal grandmother and paternal grandmother.  Social History Patient  reports that she has never smoked. She has never used smokeless tobacco. She reports current alcohol use. She reports that she does not  use drugs.    Objective: Vitals:   01/10/19 1013  Weight: 182 lb (82.6 kg)  Height: 5\' 4"  (1.626 m)    Body mass index is 31.24 kg/m.  Physical Exam Vitals signs reviewed.  Constitutional:       Appearance: Normal appearance.  HENT:     Head: Normocephalic and atraumatic.     Mouth/Throat:     Comments: Can not see tongue and mouth with phone to appreciate yellow coating  Pulmonary:     Effort: Pulmonary effort is normal.  Neurological:     General: No focal deficit present.     Mental Status: She is alert and oriented to person, place, and time.        Assessment/plan: 1. Right ear pain History of what sounds like eustachian tube dysfunction. Not on any nasal steroid spray. starting dymista, if too expensive or PA required she is to get otc flonase. Start BID and can go down to once/day after a week. Instructed on proper use of nasal steroid spray. Has ENT appointment in 4 weeks. Discussed I usually don't treat adult ear infection if no fever or bulging ear drum with abx and treat pain with NSAIDs. I can not examine her ear, so if pain gets worse, would prefer she come into office. She has had an excessive amount of antibiotics already this year so I would like to treat conservatively unless truly needs an antibiotic.   2. Mouth lesion Does not sound typical of yeast as you can usually wipe this off. Can not appreciate with phone. May be side effect from abx, but usually see hairy black tongue or glossitits with augmentin. States she did not swallow nystatin and would like to try this again. Instructed on proper use of nystatin swish and swallow. F/u with ENT. Also discussed benign lesions like fordyce spots that are yellow and very common, but her sounds more like plaque.   3. Burning sensation of mouth Discussed burning mouth syndrome, but she does not meet ICD guidelines for this. (>3 months). I wonder if she has irritated her trigeminal nerve since having ear pain, etc.. trial of NSAIDS, do not want to give gabapentin at this point and discussed if burning mouth syndrome tends to sporadically resolve on it's own.   4. Cough Improving. Recommended cool mist humidifier, mucinex,  dymista and otc cough medication. Does not want covid testing and does not sound like covid. In her office, but precautions given. If worsening symptoms needs tested and appointment.    Return if symptoms worsen or fail to improve.    Orma Flaming, MD [place]  01/10/2019

## 2019-01-10 NOTE — Telephone Encounter (Signed)
Left voicemail message on patient's cell phone requesting a call back regarding pre-screening questions prior to doxy visit this morning.

## 2019-01-11 ENCOUNTER — Other Ambulatory Visit: Payer: Self-pay

## 2019-01-11 MED ORDER — FLUTICASONE PROPIONATE 50 MCG/ACT NA SUSP: 2.0000 | Freq: Every day | NASAL | 6 refills | Status: DC

## 2019-02-01 ENCOUNTER — Encounter: Payer: Self-pay | Admitting: Family Medicine

## 2019-02-07 ENCOUNTER — Other Ambulatory Visit: Payer: Self-pay | Admitting: Family Medicine

## 2019-02-07 ENCOUNTER — Other Ambulatory Visit: Payer: Self-pay | Admitting: Otolaryngology

## 2019-02-07 DIAGNOSIS — J328 Other chronic sinusitis: Secondary | ICD-10-CM

## 2019-02-07 DIAGNOSIS — E669 Obesity, unspecified: Secondary | ICD-10-CM

## 2019-02-08 ENCOUNTER — Other Ambulatory Visit: Payer: Self-pay | Admitting: Otolaryngology

## 2019-02-08 ENCOUNTER — Encounter: Payer: Self-pay | Admitting: Family Medicine

## 2019-02-08 DIAGNOSIS — J329 Chronic sinusitis, unspecified: Secondary | ICD-10-CM

## 2019-02-10 MED ORDER — PHENTERMINE HCL 37.5 MG PO TABS: 37.5000 mg | ORAL_TABLET | Freq: Every day | ORAL | 2 refills | Status: DC

## 2019-02-11 MED FILL — PHENTERMINE 37.5 MG TABLET: 37.5 | 30 days supply | Qty: 30 | Fill #0

## 2019-02-15 ENCOUNTER — Ambulatory Visit
Admission: RE | Admit: 2019-02-15 | Discharge: 2019-02-15 | Disposition: A | Discharge status: Home / Self Care | Payer: 59 | Source: Ambulatory Visit | Attending: Otolaryngology | Admitting: Otolaryngology

## 2019-02-15 DIAGNOSIS — J329 Chronic sinusitis, unspecified: Secondary | ICD-10-CM

## 2019-02-24 HISTORY — PX: NASAL SINUS SURGERY: SHX719

## 2019-02-27 ENCOUNTER — Other Ambulatory Visit: Payer: Self-pay | Admitting: Family Medicine

## 2019-02-27 DIAGNOSIS — F5101 Primary insomnia: Secondary | ICD-10-CM

## 2019-02-27 MED FILL — ZOLPIDEM TARTRATE 5 MG TAB: 5 | 30 days supply | Qty: 30 | Fill #0

## 2019-02-27 NOTE — Telephone Encounter (Signed)
Ok to refill 

## 2019-02-27 NOTE — Telephone Encounter (Signed)
Can you call to make [facility] app let me know when done so that I can send to get ok to refill.

## 2019-02-27 NOTE — Telephone Encounter (Signed)
She is scheduled for TOC to Dr. Rogers Blocker on 05/17/19. Thank you!

## 2019-03-04 MED FILL — NEO/POLYMYXIN/DEXAMETH DROP: 3.5-10000-0 | 7 days supply | Qty: 5 | Fill #0

## 2019-03-09 ENCOUNTER — Other Ambulatory Visit: Payer: Self-pay | Admitting: Family Medicine

## 2019-03-11 ENCOUNTER — Other Ambulatory Visit: Payer: Self-pay | Admitting: Obstetrics & Gynecology

## 2019-03-11 DIAGNOSIS — Z1231 Encounter for screening mammogram for malignant neoplasm of breast: Secondary | ICD-10-CM

## 2019-03-14 MED FILL — OXYCODONE-ACETAMINOPHEN 5-3: 5-325 | 3 days supply | Qty: 18 | Fill #0

## 2019-03-14 MED FILL — AMOXICILLIN 875 MG TABS: 875 | 5 days supply | Qty: 10 | Fill #0

## 2019-04-10 MED FILL — PHENTERMINE 37.5 MG TABLET: 37.5 | 30 days supply | Qty: 30 | Fill #1

## 2019-04-10 MED FILL — ZOLPIDEM TARTRATE 5 MG TAB: 5 | 30 days supply | Qty: 30 | Fill #1

## 2019-05-06 ENCOUNTER — Ambulatory Visit
Admission: RE | Admit: 2019-05-06 | Discharge: 2019-05-06 | Disposition: A | Discharge status: Home / Self Care | Payer: 59 | Source: Ambulatory Visit | Attending: Obstetrics & Gynecology | Admitting: Obstetrics & Gynecology

## 2019-05-06 ENCOUNTER — Other Ambulatory Visit: Payer: Self-pay

## 2019-05-06 DIAGNOSIS — Z1231 Encounter for screening mammogram for malignant neoplasm of breast: Secondary | ICD-10-CM

## 2019-05-06 LAB — HM MAMMOGRAPHY

## 2019-05-08 ENCOUNTER — Encounter: Payer: Self-pay | Admitting: Family Medicine

## 2019-05-08 ENCOUNTER — Ambulatory Visit (INDEPENDENT_AMBULATORY_CARE_PROVIDER_SITE_OTHER): Payer: 59 | Admitting: Family Medicine

## 2019-05-08 ENCOUNTER — Other Ambulatory Visit: Payer: Self-pay

## 2019-05-08 VITALS — BP 123/70 | HR 90 | Temp 97.9°F | Ht 64.0 in | Wt 185.4 lb

## 2019-05-08 DIAGNOSIS — B029 Zoster without complications: Secondary | ICD-10-CM | POA: Diagnosis not present

## 2019-05-08 NOTE — Patient Instructions (Addendum)
If it bothers you (pain) try tiger balm over the counter. capsician cream is a nerve cream.   voltaren over the counter for pain. Like this stuff if don't want to take ibuprofen in pill form for your bruise/left sided pain.      Shingles  Shingles is an infection. It gives you a painful skin rash and blisters that have fluid in them. Shingles is caused by the same germ (virus) that causes chickenpox. Shingles only happens in people who:  Have had chickenpox.  Have been given a shot of medicine (vaccine) to protect against chickenpox. Shingles is rare in this group. The first symptoms of shingles may be itching, tingling, or pain in an area on your skin. A rash will show on your skin a few days or weeks later. The rash is likely to be on one side of your body. The rash usually has a shape like a belt or a band. Over time, the rash turns into fluid-filled blisters. The blisters will break open, change into scabs, and dry up. Medicines may:  Help with pain and itching.  Help you get better sooner.  Help to prevent long-term problems. Follow these instructions at home: Medicines  Take over-the-counter and prescription medicines only as told by your doctor.  Put on an anti-itch cream or numbing cream where you have a rash, blisters, or scabs. Do this as told by your doctor. Helping with itching and discomfort   Put cold, wet cloths (cold compresses) on the area of the rash or blisters as told by your doctor.  Cool baths can help you feel better. Try adding baking soda or dry oatmeal to the water to lessen itching. Do not bathe in hot water. Blister and rash care  Keep your rash covered with a loose bandage (dressing).  Wear loose clothing that does not rub on your rash.  Keep your rash and blisters clean. To do this, wash the area with mild soap and cool water as told by your doctor.  Check your rash every day for signs of infection. Check for: ? More redness, swelling, or  pain. ? Fluid or blood. ? Warmth. ? Pus or a bad smell.  Do not scratch your rash. Do not pick at your blisters. To help you to not scratch: ? Keep your fingernails clean and cut short. ? Wear gloves or mittens when you sleep, if scratching is a problem. General instructions  Rest as told by your doctor.  Keep all follow-up visits as told by your doctor. This is important.  Wash your hands often with soap and water. If soap and water are not available, use hand sanitizer. Doing this lowers your chance of getting a skin infection caused by germs (bacteria).  Your infection can cause chickenpox in people who have never had chickenpox or never got a shot of chickenpox vaccine. If you have blisters that did not change into scabs yet, try not to touch other people or be around other people, especially: ? Babies. ? Pregnant women. ? Children who have areas of red, itchy, or rough skin (eczema). ? Very old people who have transplants. ? People who have a long-term (chronic) sickness, like cancer or AIDS. Contact a doctor if:  Your pain does not get better with medicine.  Your pain does not get better after the rash heals.  You have any signs of infection in the rash area. These signs include: ? More redness, swelling, or pain around the rash. ? Fluid or blood  coming from the rash. ? The rash area feeling warm to the touch. ? Pus or a bad smell coming from the rash. Get help right away if:  The rash is on your face or nose.  You have pain in your face or pain by your eye.  You lose feeling on one side of your face.  You have trouble seeing.  You have ear pain, or you have ringing in your ear.  You have a loss of taste.  Your condition gets worse. Summary  Shingles gives you a painful skin rash and blisters that have fluid in them.  Shingles is an infection. It is caused by the same germ (virus) that causes chickenpox.  Keep your rash covered with a loose bandage  (dressing). Wear loose clothing that does not rub on your rash.  If you have blisters that did not change into scabs yet, try not to touch other people or be around people. This information is not intended to replace advice given to you by your health care provider. Make sure you discuss any questions you have with your health care provider. Document Revised: 08/03/2018 Document Reviewed: 12/14/2016 Elsevier Patient Education  2020 [employer].

## 2019-05-08 NOTE — Progress Notes (Signed)
Patient: [name] [name] MRN: [id number] DOB: 10-Jun-1965 PCP: Briscoe Deutscher, DO     Subjective:  Chief Complaint  Patient presents with  . tenderness under L armpit  . Rash    rash on back     HPI: The patient is a 54 y.o. female who presents today for rash on her back and tenderness under her left armpit.   Rash on her back: started about 10 days ago. She states it's a little small place that is 3-4 +dots. It's starting to look a little bit better. She has been using cortisone cream which didn't do anything and then last few days has put on tree tea oil. Doesn't feel as hard and swollen around it. It itches and is a little sensitive to touch. No trauma or insect bite. No burning in nature.   Left arm tenderness: noticed around same time that she had tenderness in her left arm pit. No masses, no rashes. Can't recall if she hit that area. Does have bruising on her left lateral rib cage. Mammogram done on Monday and  Normal. She is right handed.   Review of Systems  Constitutional: Negative for chills and fever.  Respiratory: Negative for shortness of breath.   Cardiovascular: Negative for chest pain.  Gastrointestinal: Negative for abdominal pain and nausea.  Musculoskeletal: Positive for myalgias.       Tenderness to L underarm, spreads slightly to upper rib area.  Skin: Positive for rash.       Rash under bra strap, patient states area was red and swollen this morning; has since resolved.  Very itchy.    Allergies Patient has No Known Allergies.  Past Medical History Patient  has a past medical history of Arthritis, Chicken pox, GERD (gastroesophageal reflux disease), Hypertension, Liver lesion, and Migraines.  Surgical History Patient  has a past surgical history that includes Knee arthroscopy (Right, 01/2017) and Nasal sinus surgery (02/2019).  Family History Pateint's family history includes Cancer in her maternal grandmother and paternal grandmother; Cancer - Lung  in her father; Diabetes in her maternal grandfather, maternal grandmother, mother, paternal grandmother, and sister; Heart disease in her paternal grandmother; Hyperlipidemia in her maternal grandfather, maternal grandmother, mother, paternal grandmother, and sister; Hypertension in her maternal grandmother and paternal grandmother.  Social History Patient  reports that she has never smoked. She has never used smokeless tobacco. She reports current alcohol use. She reports that she does not use drugs.    Objective: Vitals:   05/08/19 1457  BP: 123/70  Pulse: 90  Temp: 97.9 F (36.6 C)  TempSrc: Temporal  SpO2: 98%  Weight: 185 lb 6.4 oz (84.1 kg)  Height: 5\' 4"  (1.626 m)    Body mass index is 31.82 kg/m.  Physical Exam Vitals reviewed.  Constitutional:      Appearance: Normal appearance. She is obese.  HENT:     Head: Normocephalic and atraumatic.  Pulmonary:     Effort: Pulmonary effort is normal.  Musculoskeletal:     Comments: TTP along lateral left superior rib cage up into left axialle. No masses, lymph nodes or other abnormality appreciated.   Lymphadenopathy:     Upper Body:     Left upper body: No supraclavicular, axillary or pectoral adenopathy.  Skin:    General: Skin is warm and dry.     Findings: Rash present.     Comments: Rash on right upper back in dermatomal pattern consisting of about 5 scabbed over lesion on erythematous base. No vesicles.  Neurological:     General: No focal deficit present.     Mental Status: She is alert and oriented to person, place, and time.  Psychiatric:        Mood and Affect: Mood normal.        Behavior: Behavior normal.        Assessment/plan: 1. Herpes zoster without complication Out of treatment window and scabbed over. Continue with conservative therapy and can try tiger balm if any pain. Handout given.   2. mylagia -no red flags on exam. essentially normal. Question if bone bruise or over use. Will continue to  monitor. Let me know if any change. Could do trial of voltaren gel.    -has annual/labs in one months time.   This visit occurred during the SARS-CoV-2 public health emergency.  Safety protocols were in place, including screening questions prior to the visit, additional usage of staff PPE, and extensive cleaning of exam room while observing appropriate contact time as indicated for disinfecting solutions.     Return if symptoms worsen or fail to improve.   Orma Flaming, MD [place]   05/08/2019

## 2019-05-17 ENCOUNTER — Encounter: Payer: 59 | Admitting: Family Medicine

## 2019-05-22 ENCOUNTER — Ambulatory Visit: Payer: 59 | Attending: Internal Medicine

## 2019-05-22 DIAGNOSIS — Z20822 Contact with and (suspected) exposure to covid-19: Secondary | ICD-10-CM

## 2019-05-24 LAB — NOVEL CORONAVIRUS, NAA: SARS-CoV-2, NAA: DETECTED — AB

## 2019-05-25 ENCOUNTER — Telehealth (HOSPITAL_COMMUNITY): Payer: Self-pay | Admitting: Physician Assistant

## 2019-05-25 ENCOUNTER — Telehealth: Payer: Self-pay | Admitting: Adult Health

## 2019-05-25 NOTE — Telephone Encounter (Signed)
Called to discuss with Jenkins Rouge about Covid symptoms and the use of bamlanivimab, a monoclonal antibody infusion for those with mild to moderate Covid symptoms and at a high risk of hospitalization.     Pt is qualified for this infusion at the [facility] infusion center due to co-morbid conditions and/or a member of an at-risk group, however wants to think about it. Symptoms tier reviewed as well as criteria for ending isolation.  Symptoms reviewed that would warrant ED/Hospital evaluation. Preventative practices reviewed. Patient verbalized understanding. Patient advised to call back if he decides that he does want to get infusion. Callback number to the hotline  given.       Patient Active Problem List   Diagnosis Date Noted  . Migraines 04/14/2018  . Obesity (BMI 30.0-34.9) 04/14/2018  . Vitamin D deficiency 04/14/2018  . Primary insomnia 04/14/2018  . GERD (gastroesophageal reflux disease)   . Menopausal syndrome 04/13/2018  . S/P right knee arthroscopy 02/22/2017  . Chronic pansinusitis 07/06/2016  . 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 Hypertension 01/23/2016  . OSA (obstructive sleep apnea) 06/22/2007  . Abnormal LFTs 06/22/2007  . 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

## 2019-05-25 NOTE — Telephone Encounter (Signed)
Called to discuss with [name] [name] about Covid symptoms and the use of bamlanivimab, a monoclonal antibody infusion for those with mild to moderate Covid symptoms and at a high risk of hospitalization.     Pt is qualified for this infusion at the [facility] infusion center due to co-morbid conditions and/or a member of an at-risk group, however schedule is full . Will place on cancellation list .  Symptoms tier reviewed as well as criteria for ending isolation.  Symptoms reviewed that would warrant ED/Hospital evaluation. Preventative practices reviewed. Patient verbalized understanding. Patient advised to call back if he decides that he does want to get infusion. Callback number to the infusion center given. Patient advised to go to Urgent care or ED with severe symptoms. Last date she would be eligible for infusion is 05/26/19        Patient Active Problem List   Diagnosis Date Noted  . Migraines 04/14/2018  . Obesity (BMI 30.0-34.9) 04/14/2018  . Vitamin D deficiency 04/14/2018  . Primary insomnia 04/14/2018  . GERD (gastroesophageal reflux disease)   . Menopausal syndrome 04/13/2018  . S/P right knee arthroscopy 02/22/2017  . Chronic pansinusitis 07/06/2016  . 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 Hypertension 01/23/2016  . OSA (obstructive sleep apnea) 06/22/2007  . Abnormal LFTs 06/22/2007  . 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

## 2019-06-03 MED FILL — ZOLPIDEM TARTRATE 5 MG TAB: 5 | 30 days supply | Qty: 30 | Fill #2

## 2019-06-03 MED FILL — PHENTERMINE 37.5 MG TABLET: 37.5 | 30 days supply | Qty: 30 | Fill #2

## 2019-06-24 LAB — HM PAP SMEAR

## 2019-07-01 ENCOUNTER — Encounter: Payer: Self-pay | Admitting: Family Medicine

## 2019-07-01 ENCOUNTER — Ambulatory Visit (INDEPENDENT_AMBULATORY_CARE_PROVIDER_SITE_OTHER): Payer: 59 | Admitting: Family Medicine

## 2019-07-01 ENCOUNTER — Other Ambulatory Visit: Payer: Self-pay

## 2019-07-01 VITALS — BP 134/80 | HR 88 | Temp 97.6°F | Ht 64.0 in | Wt 185.6 lb

## 2019-07-01 DIAGNOSIS — E559 Vitamin D deficiency, unspecified: Secondary | ICD-10-CM | POA: Diagnosis not present

## 2019-07-01 DIAGNOSIS — K146 Glossodynia: Secondary | ICD-10-CM

## 2019-07-01 DIAGNOSIS — I10 Essential (primary) hypertension: Secondary | ICD-10-CM

## 2019-07-01 DIAGNOSIS — E538 Deficiency of other specified B group vitamins: Secondary | ICD-10-CM

## 2019-07-01 DIAGNOSIS — Z0000 Encounter for general adult medical examination without abnormal findings: Secondary | ICD-10-CM | POA: Diagnosis not present

## 2019-07-01 LAB — MICROALBUMIN / CREATININE URINE RATIO
Creatinine,U: 206.9 mg/dL
Microalb Creat Ratio: 0.8 mg/g (ref 0.0–30.0)
Microalb, Ur: 1.6 mg/dL (ref 0.0–1.9)

## 2019-07-01 LAB — TSH: TSH: 1.49 u[IU]/mL (ref 0.35–4.50)

## 2019-07-01 LAB — CBC WITH DIFFERENTIAL/PLATELET
Basophils Absolute: 0.1 10*3/uL (ref 0.0–0.1)
Basophils Relative: 1 % (ref 0.0–3.0)
Eosinophils Absolute: 0.3 10*3/uL (ref 0.0–0.7)
Eosinophils Relative: 4.3 % (ref 0.0–5.0)
HCT: 40 % (ref 36.0–46.0)
Hemoglobin: 13.7 g/dL (ref 12.0–15.0)
Lymphocytes Relative: 26.4 % (ref 12.0–46.0)
Lymphs Abs: 1.9 10*3/uL (ref 0.7–4.0)
MCHC: 34.3 g/dL (ref 30.0–36.0)
MCV: 89.1 fl (ref 78.0–100.0)
Monocytes Absolute: 0.5 10*3/uL (ref 0.1–1.0)
Monocytes Relative: 7.6 % (ref 3.0–12.0)
Neutro Abs: 4.3 10*3/uL (ref 1.4–7.7)
Neutrophils Relative %: 60.7 % (ref 43.0–77.0)
Platelets: 226 10*3/uL (ref 150.0–400.0)
RBC: 4.49 Mil/uL (ref 3.87–5.11)
RDW: 12.7 % (ref 11.5–15.5)
WBC: 7.1 10*3/uL (ref 4.0–10.5)

## 2019-07-01 LAB — VITAMIN B12: Vitamin B-12: 457 pg/mL (ref 211–911)

## 2019-07-01 LAB — COMPREHENSIVE METABOLIC PANEL
ALT: 58 U/L — ABNORMAL HIGH (ref 0–35)
AST: 37 U/L (ref 0–37)
Albumin: 4.1 g/dL (ref 3.5–5.2)
Alkaline Phosphatase: 91 U/L (ref 39–117)
BUN: 16 mg/dL (ref 6–23)
CO2: 29 mEq/L (ref 19–32)
Calcium: 9.8 mg/dL (ref 8.4–10.5)
Chloride: 101 mEq/L (ref 96–112)
Creatinine, Ser: 0.86 mg/dL (ref 0.40–1.20)
GFR: 68.47 mL/min (ref >60.00)
Glucose, Bld: 94 mg/dL (ref 70–99)
Potassium: 3.8 mEq/L (ref 3.5–5.1)
Sodium: 138 mEq/L (ref 135–145)
Total Bilirubin: 1.1 mg/dL (ref 0.2–1.2)
Total Protein: 7.2 g/dL (ref 6.0–8.3)

## 2019-07-01 LAB — LIPID PANEL
Cholesterol: 189 mg/dL (ref 0–200)
HDL: 66.1 mg/dL (ref >39.00)
LDL Cholesterol: 108 mg/dL — ABNORMAL HIGH (ref 0–99)
NonHDL: 123.29
Total CHOL/HDL Ratio: 3
Triglycerides: 77 mg/dL (ref 0.0–149.0)
VLDL: 15.4 mg/dL (ref 0.0–40.0)

## 2019-07-01 LAB — VITAMIN D 25 HYDROXY (VIT D DEFICIENCY, FRACTURES): VITD: 26.93 ng/mL — ABNORMAL LOW (ref 30.00–100.00)

## 2019-07-01 MED ORDER — PHENTERMINE HCL 37.5 MG PO TABS: 37.5000 mg | ORAL_TABLET | Freq: Every day | ORAL | 2 refills | Status: DC

## 2019-07-01 MED FILL — PHENTERMINE 37.5 MG TABLET: 37.5 | 30 days supply | Qty: 30 | Fill #0

## 2019-07-01 NOTE — Progress Notes (Signed)
Patient: [name] [name] MRN: [id number] DOB: 07-Sep-1965 PCP: Orma Flaming, MD     Subjective:  Chief Complaint  Patient presents with  . Transitions Of Care    HPI: The patient is a 54 y.o. female who presents today for annual exam. She denies any changes to past medical history. There have been no recent hospitalizations. They are following a well balanced diet and exercise plan. Weight has been increasing steadily. No complaints today.   Hypertension: Here for follow up of hypertension.  Currently on maxzide Takes medication as prescribed and denies any side effects. Exercise includes walking. Weight has been stable. Denies any chest pain, headaches, shortness of breath, vision changes, swelling in lower extremities. Has not taken her medication today.   Vit. D deficiency: needs labs today. She is not on a daily vitamin D.   b12 deficiency: had to do injections back in 2019. Not on daily replacement.   Also adipex for weight loss. She takes 1/2 pill/day. Weight has been stable.   She is still having burning mouth syndrome that I saw her for in the fall. Not quite ready for medication as it is improving, but still there.    Immunization History  Administered Date(s) Administered  . Influenza Inj Mdck Quad Pf 03/26/2018  . Influenza Whole 03/27/2018  . Influenza, Quadrivalent, Recombinant, Inj, Pf 03/26/2019  . Influenza,inj,Quad PF,6+ Mos 02/12/2015  . Influenza,inj,quad, With Preservative 02/12/2015  . Influenza-Unspecified 02/06/2014, 01/24/2015  . Tdap 11/18/2010   Colonoscopy:05/08/2015 Mammogram: 05/06/2019 Pap smear: 06/24/2019 tdaP : utd   Review of Systems  Constitutional: Negative for chills, fatigue and fever.  HENT: Negative for dental problem, ear pain, hearing loss and trouble swallowing.   Eyes: Negative for visual disturbance.  Respiratory: Negative for cough, chest tightness and shortness of breath.   Cardiovascular: Negative for chest pain,  palpitations and leg swelling.  Gastrointestinal: Negative for abdominal pain, blood in stool, diarrhea and nausea.  Endocrine: Negative for cold intolerance, polydipsia, polyphagia and polyuria.  Genitourinary: Negative for dysuria and hematuria.  Musculoskeletal: Negative for arthralgias.  Skin: Negative for rash.  Neurological: Negative for dizziness and headaches.  Psychiatric/Behavioral: Negative for dysphoric mood and sleep disturbance. The patient is not nervous/anxious.     Allergies Patient has No Known Allergies.  Past Medical History Patient  has a past medical history of Arthritis, Chicken pox, GERD (gastroesophageal reflux disease), Hypertension, Liver lesion, and Migraines.  Surgical History Patient  has a past surgical history that includes Knee arthroscopy (Right, 01/2017) and Nasal sinus surgery (02/2019).  Family History Pateint's family history includes Cancer in her maternal grandmother and paternal grandmother; Cancer - Lung in her father; Diabetes in her maternal grandfather, maternal grandmother, mother, paternal grandmother, and sister; Heart disease in her paternal grandmother; Hyperlipidemia in her maternal grandfather, maternal grandmother, mother, paternal grandmother, and sister; Hypertension in her maternal grandmother and paternal grandmother.  Social History Patient  reports that she has never smoked. She has never used smokeless tobacco. She reports current alcohol use. She reports that she does not use drugs.    Objective: Vitals:   07/01/19 0931 07/01/19 1001  BP: 136/90 134/80  Pulse: 88   Temp: 97.6 F (36.4 C)   TempSrc: Temporal   SpO2: 96%   Weight: 185 lb 9.6 oz (84.2 kg)   Height: 5\' 4"  (1.626 m)     Body mass index is 31.86 kg/m.  Physical Exam Vitals reviewed.  Constitutional:      Appearance: Normal appearance. She is  well-developed. She is obese.  HENT:     Head: Normocephalic and atraumatic.     Right Ear: Tympanic  membrane, ear canal and external ear normal.     Left Ear: Tympanic membrane, ear canal and external ear normal.  Eyes:     Conjunctiva/sclera: Conjunctivae normal.     Pupils: Pupils are equal, round, and reactive to light.  Neck:     Thyroid: No thyromegaly.  Cardiovascular:     Rate and Rhythm: Normal rate and regular rhythm.     Heart sounds: Normal heart sounds. No murmur.  Pulmonary:     Effort: Pulmonary effort is normal.     Breath sounds: Normal breath sounds.  Abdominal:     General: Bowel sounds are normal. There is no distension.     Palpations: Abdomen is soft.     Tenderness: There is no abdominal tenderness.  Musculoskeletal:     Cervical back: Normal range of motion and neck supple.  Lymphadenopathy:     Cervical: No cervical adenopathy.  Skin:    General: Skin is warm and dry.     Findings: No rash.  Neurological:     General: No focal deficit present.     Mental Status: She is alert and oriented to person, place, and time.     Cranial Nerves: No cranial nerve deficit.     Coordination: Coordination normal.     Deep Tendon Reflexes: Reflexes normal.  Psychiatric:        Mood and Affect: Mood normal.        Behavior: Behavior normal.      Office Visit from 07/01/2019 in [facility]  PHQ-2 Total Score  0         Assessment/plan: 1. Annual physical exam Routine fasting labs. UTD on HM. Encouraged exercise, weight loss. F/u in one year or as needed.  Patient counseling [x]    Nutrition: Stressed importance of moderation in sodium/caffeine intake, saturated fat and cholesterol, caloric balance, sufficient intake of fresh fruits, vegetables, fiber, calcium, iron, and 1 mg of folate supplement per day (for females capable of pregnancy).  [x]    Stressed the importance of regular exercise.   []    Substance Abuse: Discussed cessation/primary prevention of tobacco, alcohol, or other drug use; driving or other dangerous activities under the  influence; availability of treatment for abuse.   [x]    Injury prevention: Discussed safety belts, safety helmets, smoke detector, smoking near bedding or upholstery.   [x]    Sexuality: Discussed sexually transmitted diseases, partner selection, use of condoms, avoidance of unintended pregnancy  and contraceptive alternatives.  [x]    Dental health: Discussed importance of regular tooth brushing, flossing, and dental visits.  [x]    Health maintenance and immunizations reviewed. Please refer to Health maintenance section.    - TSH  2. Essential hypertension A little above goal, but has not taken her medication today. Continue current medication. Routine lab work today. Encouraged exercise, weight loss and dash/meditteranean diet. F/u ion 12 months.  - CBC with Differential/Platelet - Comprehensive metabolic panel - Lipid panel - Microalbumin / creatinine urine ratio  3. Vitamin D deficiency  - VITAMIN D 25 Hydroxy (Vit-D Deficiency, Fractures)  4. B12 deficiency  - Vitamin B12  5. Burning mouth syndrome Will continue to give this more time as it is improving. Discussed we could do a trial of gabapentin and she can just email me and let me know.   6. Morbid obesity ([place]) -refilled her adipex. Encouraged  exercise. pmp website reviewed.   7. Insomnia -ambien prn. Will have her trial ashwagandha as well and discussed dosing. Encouraged exercise as well.  pmp reviewed.   This visit occurred during the SARS-CoV-2 public health emergency.  Safety protocols were in place, including screening questions prior to the visit, additional usage of staff PPE, and extensive cleaning of exam room while observing appropriate contact time as indicated for disinfecting solutions.     Return in about 1 year (around 06/30/2020).   Orma Flaming, MD [place]  07/01/2019

## 2019-07-01 NOTE — Patient Instructions (Addendum)
For burning tongue... can think about gabapentin. It's a nerve drug that you can take 1-3xday.  I would give it a full year though so we can keep you off medication as long as possible.   For sleep, can try ashwagandha 372m about 30 min-1 hour before bed time. Can get at herbal store or I think [facility] it.   Labs today.   Let me know if you decide you want to start gabapentin, just send me email!   Good to see you today,   Dr. WRogers Blocker   Preventive Care [age]Years Old, Female Preventive care refers to visits with your health care provider and lifestyle choices that can promote health and wellness. This includes:  A yearly physical exam. This may also be called an annual well check.  Regular dental visits and eye exams.  Immunizations.  Screening for certain conditions.  Healthy lifestyle choices, such as eating a healthy diet, getting regular exercise, not using drugs or products that contain nicotine and tobacco, and limiting alcohol use. What can I expect for my preventive care visit? Physical exam Your health care provider will check your:  Height and weight. This may be used to calculate body mass index (BMI), which tells if you are at a healthy weight.  Heart rate and blood pressure.  Skin for abnormal spots. Counseling Your health care provider may ask you questions about your:  Alcohol, tobacco, and drug use.  Emotional well-being.  Home and relationship well-being.  Sexual activity.  Eating habits.  Work and work eStatistician  Method of birth control.  Menstrual cycle.  Pregnancy history. What immunizations do I need?  Influenza (flu) vaccine  This is recommended every year. Tetanus, diphtheria, and pertussis (Tdap) vaccine  You may need a Td booster every 10 years. Varicella (chickenpox) vaccine  You may need this if you have not been vaccinated. Zoster (shingles) vaccine  You may need this after age 55 Measles, mumps, and rubella  (MMR) vaccine  You may need at least one dose of MMR if you were born in 1957 or later. You may also need a second dose. Pneumococcal conjugate (PCV13) vaccine  You may need this if you have certain conditions and were not previously vaccinated. Pneumococcal polysaccharide (PPSV23) vaccine  You may need one or two doses if you smoke cigarettes or if you have certain conditions. Meningococcal conjugate (MenACWY) vaccine  You may need this if you have certain conditions. Hepatitis A vaccine  You may need this if you have certain conditions or if you travel or work in places where you may be exposed to hepatitis A. Hepatitis B vaccine  You may need this if you have certain conditions or if you travel or work in places where you may be exposed to hepatitis B. Haemophilus influenzae type b (Hib) vaccine  You may need this if you have certain conditions. Human papillomavirus (HPV) vaccine  If recommended by your health care provider, you may need three doses over 6 months. You may receive vaccines as individual doses or as more than one vaccine together in one shot (combination vaccines). Talk with your health care provider about the risks and benefits of combination vaccines. What tests do I need? Blood tests  Lipid and cholesterol levels. These may be checked every 5 years, or more frequently if you are over 55years old.  Hepatitis C test.  Hepatitis B test. Screening  Lung cancer screening. You may have this screening every year starting at age  55 if you have a 30-pack-year history of smoking and currently smoke or have quit within the past 15 years.  Colorectal cancer screening. All adults should have this screening starting at age 68 and continuing until age 55. Your health care provider may recommend screening at age 55 if you are at increased risk. You will have tests every 1-10 years, depending on your results and the type of screening test.  Diabetes screening. This is done  by checking your blood sugar (glucose) after you have not eaten for a while (fasting). You may have this done every 1-3 years.  Mammogram. This may be done every 1-2 years. Talk with your health care provider about when you should start having regular mammograms. This may depend on whether you have a family history of breast cancer.  BRCA-related cancer screening. This may be done if you have a family history of breast, ovarian, tubal, or peritoneal cancers.  Pelvic exam and Pap test. This may be done every 3 years starting at age 55. Starting at age 41, this may be done every 5 years if you have a Pap test in combination with an HPV test. Other tests  Sexually transmitted disease (STD) testing.  Bone density scan. This is done to screen for osteoporosis. You may have this scan if you are at high risk for osteoporosis. Follow these instructions at home: Eating and drinking  Eat a diet that includes fresh fruits and vegetables, whole grains, lean protein, and low-fat dairy.  Take vitamin and mineral supplements as recommended by your health care provider.  Do not drink alcohol if: ? Your health care provider tells you not to drink. ? You are pregnant, may be pregnant, or are planning to become pregnant.  If you drink alcohol: ? Limit how much you have to 0-1 drink a day. ? Be aware of how much alcohol is in your drink. In the [place] one drink equals one 12 oz bottle of beer (355 mL), one 5 oz glass of wine (148 mL), or one 1 oz glass of hard liquor (44 mL). Lifestyle  Take daily care of your teeth and gums.  Stay active. Exercise for at least 30 minutes on 5 or more days each week.  Do not use any products that contain nicotine or tobacco, such as cigarettes, e-cigarettes, and chewing tobacco. If you need help quitting, ask your health care provider.  If you are sexually active, practice safe sex. Use a condom or other form of birth control (contraception) in order to prevent  pregnancy and STIs (sexually transmitted infections).  If told by your health care provider, take low-dose aspirin daily starting at age 55. What's next?  Visit your health care provider once a year for a well check visit.  Ask your health care provider how often you should have your eyes and teeth checked.  Stay up to date on all vaccines. This information is not intended to replace advice given to you by your health care provider. Make sure you discuss any questions you have with your health care provider. Document Revised: 12/21/2017 Document Reviewed: 12/21/2017 Elsevier Patient Education  2020 [employer].

## 2019-07-01 NOTE — Progress Notes (Deleted)
Patient: Taneyah Abdelaziz MRN: [id number] DOB: 01-Oct-1965 PCP: No primary care provider on file.     Subjective:  Chief Complaint  Patient presents with  . Transitions Of Care    HPI: The patient is a 54 y.o. female who presents today for ***  Review of Systems  Constitutional: Negative for chills, fatigue and fever.  HENT: Negative for sinus pressure, sinus pain and sore throat.   Respiratory: Negative for cough, shortness of breath and wheezing.   Cardiovascular: Negative for chest pain and palpitations.  Gastrointestinal: Negative for nausea and vomiting.  Neurological: Negative for dizziness, light-headedness and headaches.    Allergies Patient has No Known Allergies.  Past Medical History Patient  has a past medical history of Arthritis, Chicken pox, GERD (gastroesophageal reflux disease), Hypertension, Liver lesion, and Migraines.  Surgical History Patient  has a past surgical history that includes Knee arthroscopy (Right, 01/2017) and Nasal sinus surgery (02/2019).  Family History Pateint's family history includes Cancer in her maternal grandmother and paternal grandmother; Cancer - Lung in her father; Diabetes in her maternal grandfather, maternal grandmother, mother, paternal grandmother, and sister; Heart disease in her paternal grandmother; Hyperlipidemia in her maternal grandfather, maternal grandmother, mother, paternal grandmother, and sister; Hypertension in her maternal grandmother and paternal grandmother.  Social History Patient  reports that she has never smoked. She has never used smokeless tobacco. She reports current alcohol use. She reports that she does not use drugs.    Objective: Vitals:   07/01/19 0931  BP: 136/90  Pulse: 88  Temp: 97.6 F (36.4 C)  TempSrc: Temporal  SpO2: 96%  Weight: 185 lb 9.6 oz (84.2 kg)  Height: 5\' 4"  (1.626 m)    Body mass index is 31.86 kg/m.  Physical Exam     Assessment/plan:    This visit occurred  during the SARS-CoV-2 public health emergency.  Safety protocols were in place, including screening questions prior to the visit, additional usage of staff PPE, and extensive cleaning of exam room while observing appropriate contact time as indicated for disinfecting solutions.    No follow-ups on file.   Orma Flaming, MD [place]   07/01/2019

## 2019-07-03 ENCOUNTER — Other Ambulatory Visit: Payer: Self-pay | Admitting: Family Medicine

## 2019-07-03 DIAGNOSIS — R945 Abnormal results of liver function studies: Secondary | ICD-10-CM

## 2019-07-03 DIAGNOSIS — R7989 Other specified abnormal findings of blood chemistry: Secondary | ICD-10-CM

## 2019-07-04 ENCOUNTER — Other Ambulatory Visit: Payer: Self-pay | Admitting: Family Medicine

## 2019-07-04 DIAGNOSIS — R7989 Other specified abnormal findings of blood chemistry: Secondary | ICD-10-CM

## 2019-07-04 DIAGNOSIS — R945 Abnormal results of liver function studies: Secondary | ICD-10-CM

## 2019-07-11 ENCOUNTER — Encounter: Payer: Self-pay | Admitting: Family Medicine

## 2019-07-16 ENCOUNTER — Ambulatory Visit
Admission: RE | Admit: 2019-07-16 | Discharge: 2019-07-16 | Disposition: A | Discharge status: Home / Self Care | Payer: 59 | Source: Ambulatory Visit | Attending: Family Medicine | Admitting: Family Medicine

## 2019-07-16 DIAGNOSIS — R7989 Other specified abnormal findings of blood chemistry: Secondary | ICD-10-CM

## 2019-07-16 DIAGNOSIS — R945 Abnormal results of liver function studies: Secondary | ICD-10-CM

## 2019-07-17 ENCOUNTER — Encounter: Payer: Self-pay | Admitting: Family Medicine

## 2019-07-17 DIAGNOSIS — K760 Fatty (change of) liver, not elsewhere classified: Secondary | ICD-10-CM | POA: Insufficient documentation

## 2019-07-23 MED FILL — ZOLPIDEM TARTRATE 5 MG TAB: 5 | 30 days supply | Qty: 30 | Fill #3

## 2019-07-29 ENCOUNTER — Encounter: Payer: Self-pay | Admitting: Family Medicine

## 2019-08-19 ENCOUNTER — Other Ambulatory Visit: Payer: Self-pay | Admitting: Family Medicine

## 2019-08-19 DIAGNOSIS — F5101 Primary insomnia: Secondary | ICD-10-CM

## 2019-08-19 MED ORDER — ZOLPIDEM TARTRATE 5 MG PO TABS: 5.0000 mg | ORAL_TABLET | Freq: Every day | ORAL | 3 refills | Status: DC

## 2019-08-19 MED FILL — PHENTERMINE 37.5 MG TABLET: 37.5 | 30 days supply | Qty: 30 | Fill #1

## 2019-08-19 NOTE — Telephone Encounter (Signed)
Pt requesting Zolpidem 5mg   LOV: 07/01/19 No upcoming visits   Approve?

## 2019-08-20 MED FILL — ZOLPIDEM TARTRATE 5 MG TAB: 5 | 30 days supply | Qty: 30 | Fill #0

## 2019-09-16 ENCOUNTER — Other Ambulatory Visit: Payer: Self-pay | Admitting: Family Medicine

## 2019-10-07 MED FILL — ZOLPIDEM TARTRATE 5 MG TAB: 5 | 30 days supply | Qty: 30 | Fill #1

## 2019-10-07 MED FILL — PHENTERMINE 37.5 MG TABLET: 37.5 | 30 days supply | Qty: 30 | Fill #2

## 2019-10-27 IMAGING — MR MR HEAD W/O CM
10 series · 48 of 48 positions shown · non-contrast
Comparison: None.

EXAM:
MRI HEAD WITHOUT CONTRAST

MRI CERVICAL SPINE WITHOUT CONTRAST
TECHNIQUE: Multiplanar, multiecho pulse sequences of the brain and surrounding
structures, and cervical spine, to include the craniocervical
junction and cervicothoracic junction, were obtained without
intravenous contrast.

[Series 2: t1_3d_tra · axial · 2.0mm · 0.90mm/px · z∈[-39,+134]mm · 8 of 88 slices shown]
[im 1/88]
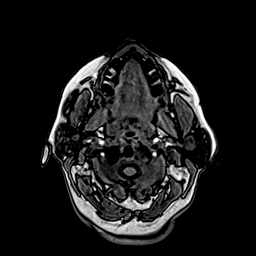
[im 13/88]
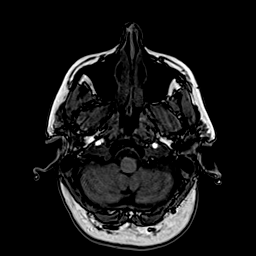
[im 25/88]
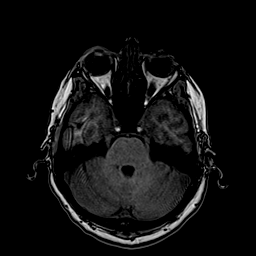
[im 38/88]
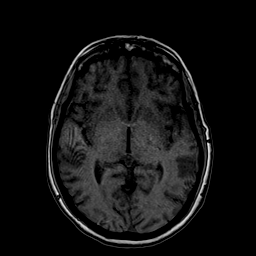
[im 50/88]
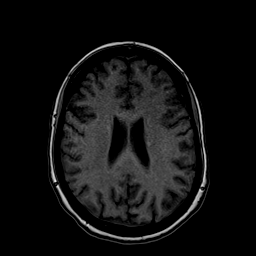
[im 63/88]
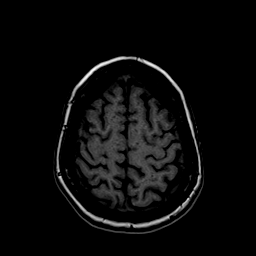
[im 75/88]
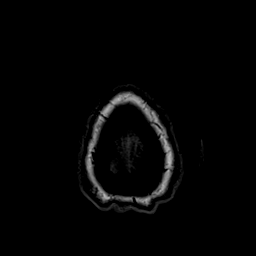
[im 88/88]
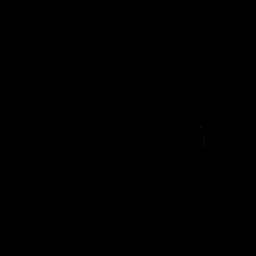

[Series 3: T2 · coronal · 5.0mm · 0.69mm/px · 3 of 30 slices shown (1 of 3)]
[im 1/30]
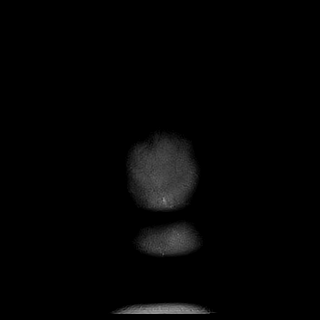
[im 15/30]
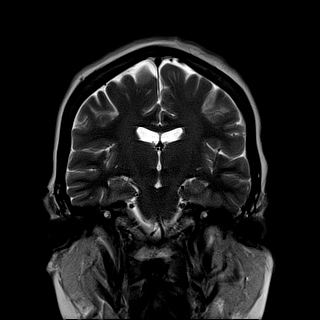
[im 30/30]
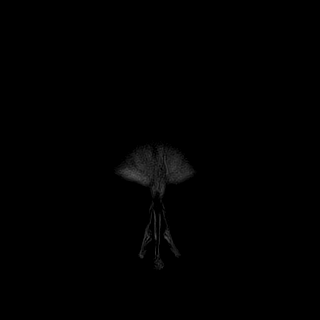

[Series 4: DWI · coronal · 3.0mm · 1.46mm/px · 8 of 90 slices shown (1 of 4)]
[im 1/90]
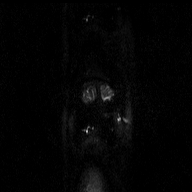
[im 13/90]
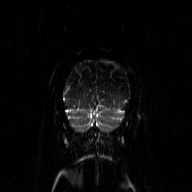
[im 26/90]
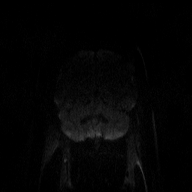
[im 39/90]
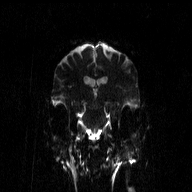
[im 51/90]
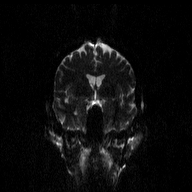
[im 64/90]
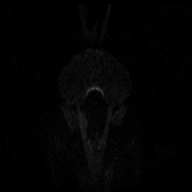
[im 77/90]
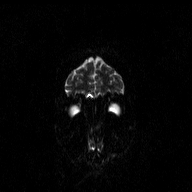
[im 90/90]
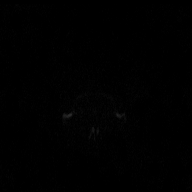

[Series 5: DWI · coronal · 3.0mm · 1.46mm/px · 4 of 45 slices shown (2 of 4)]
[im 1/45]
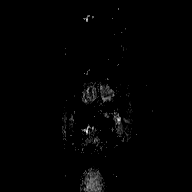
[im 15/45]
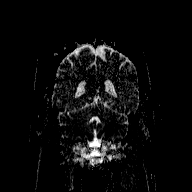
[im 30/45]
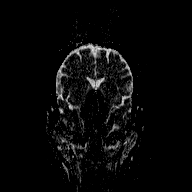
[im 45/45]
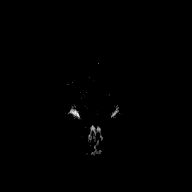

[Series 102: T1 · sagittal · 5.0mm · 0.90mm/px · 2 of 25 slices shown]
[im 1/25]
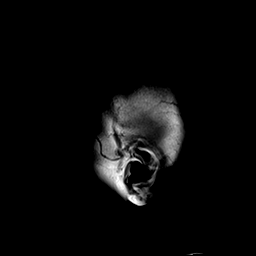
[im 25/25]
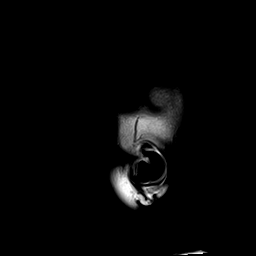

[Series 103: DWI · axial · 3.0mm · 1.88mm/px · z∈[-34,+120]mm · 9 of 96 slices shown (3 of 4)]
[im 1/96]
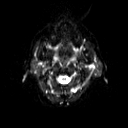
[im 12/96]
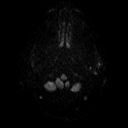
[im 24/96]
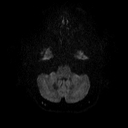
[im 36/96]
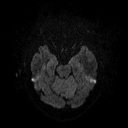
[im 48/96]
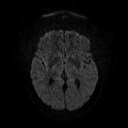
[im 60/96]
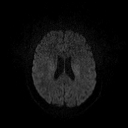
[im 72/96]
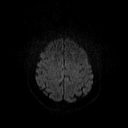
[im 84/96]
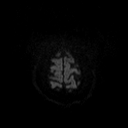
[im 96/96]
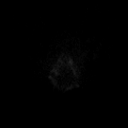

[Series 104: DWI · axial · 3.0mm · 1.88mm/px · z∈[-34,+120]mm · 4 of 48 slices shown (4 of 4)]
[im 1/48]
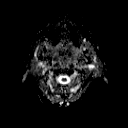
[im 16/48]
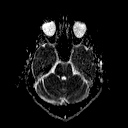
[im 32/48]
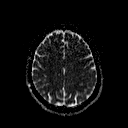
[im 48/48]
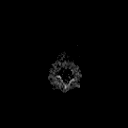

[Series 105: T2 · axial · 5.0mm · 0.69mm/px · z∈[-39,+123]mm · 3 of 28 slices shown (2 of 3)]
[im 1/28]
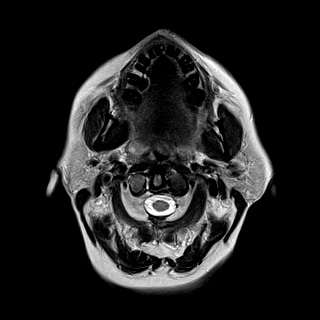
[im 14/28]
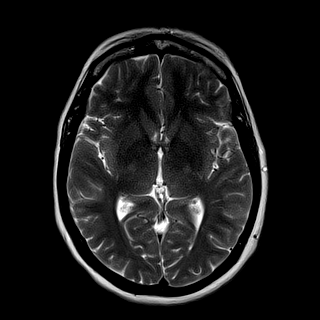
[im 28/28]
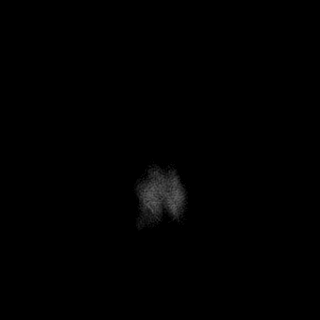

[Series 106: T2 · axial · 5.0mm · 0.43mm/px · z∈[-39,+123]mm · 3 of 28 slices shown (3 of 3)]
[im 1/28]
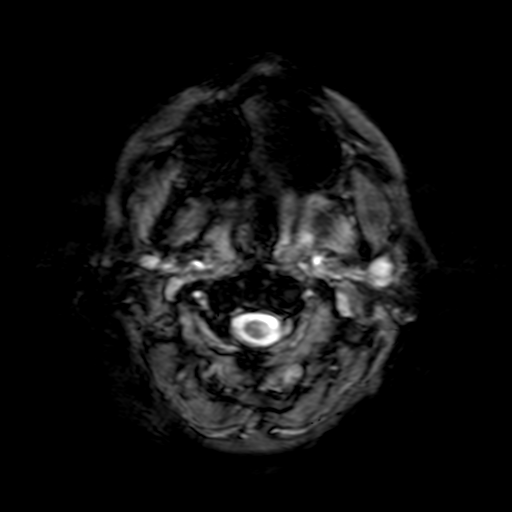
[im 14/28]
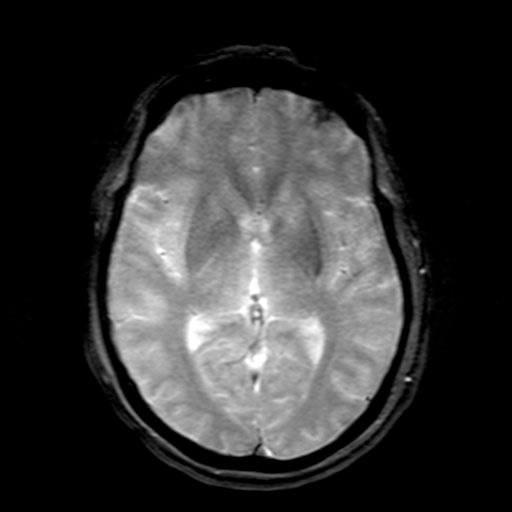
[im 28/28]
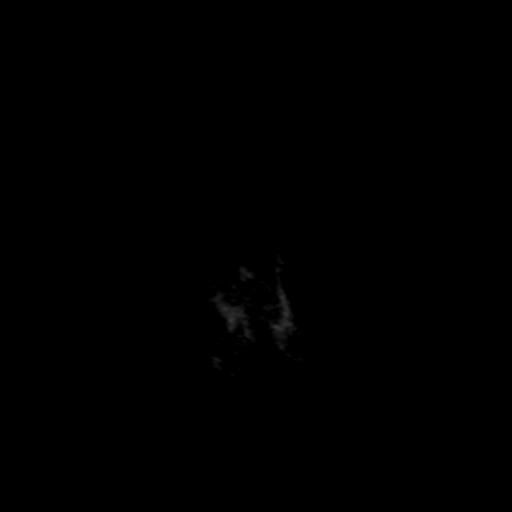

[Series 107: FLAIR · axial · 3.0mm · 0.43mm/px · z∈[-39,+124]mm · 4 of 42 slices shown]
[im 1/42]
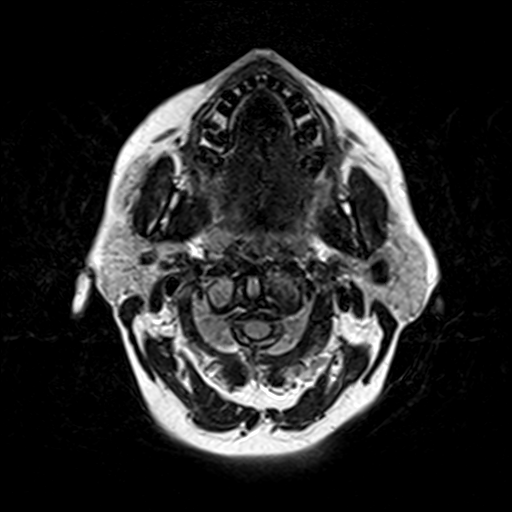
[im 14/42]
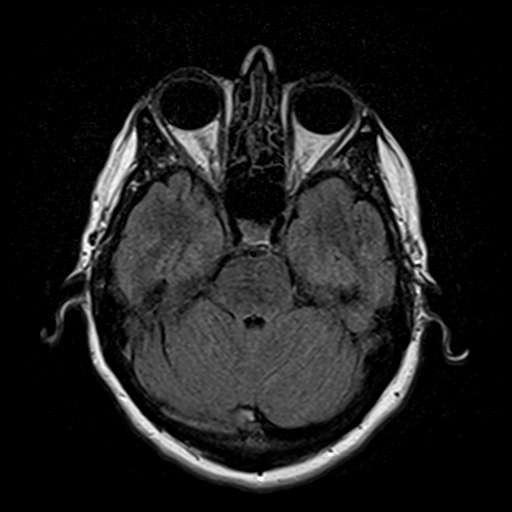
[im 28/42]
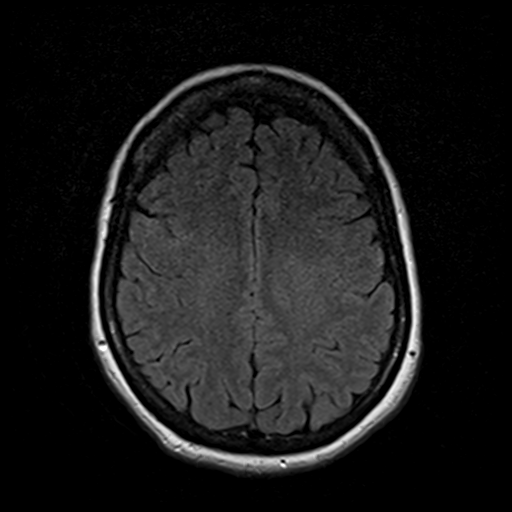
[im 42/42]
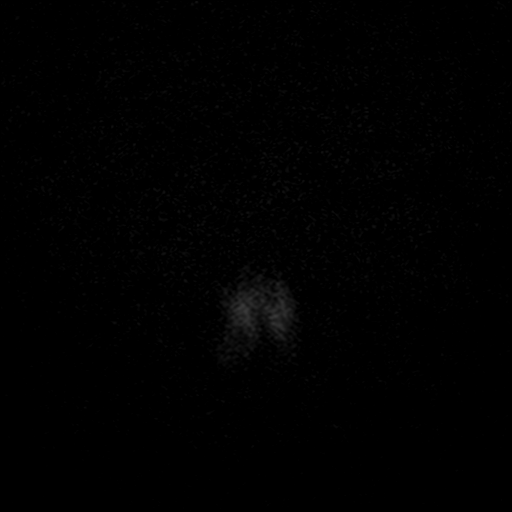

[48 of 48 positions shown; findings below may reference images not displayed]

FINDINGS: MRI HEAD FINDINGS

Brain: No evidence for acute infarction, hemorrhage, mass lesion,
hydrocephalus, or extra-axial fluid. Normal cerebral volume, except
for asymmetric RIGHT inferior frontal gyrus rectus atrophy, likely
sequelae of old head trauma. No white matter disease.

Vascular: Flow voids are maintained throughout the carotid, basilar,
and vertebral arteries. There are no areas of chronic hemorrhage.

Skull and upper cervical spine: Unremarkable visualized calvarium
and skull base. Pituitary, pineal, cerebellar tonsils unremarkable.

Sinuses/Orbits: Small retention cyst RIGHT maxillary sinus,
otherwise negative.

Other: None.

MRI CERVICAL SPINE FINDINGS

Alignment: Reversal of the normal cervical lordotic curve, Erxleben
deformity. No subluxation.

Vertebrae: No worrisome osseous lesion.

Cord: The cord is draped over the C5-6 due to reversed lordosis,
with slight cord flattening due to superimposed disc protrusion
described below.

Posterior Fossa, vertebral arteries, paraspinal tissues: No
tonsillar herniation. Vertebral flow voids are maintained. No
visible neck masses.

Disc levels:

C2-3:  Normal.

C3-4: BILATERAL facet arthropathy. Normal disc space. No
impingement.

C4-5: BILATERAL facet arthropathy. Normal disc space. No
impingement.

C5-6: Apex of cervical spine lordotic reversal. Central and
rightward protrusion. Slight cord flattening. RIGHT C6 foraminal
narrowing.

C6-7: Shallow central and leftward protrusion. Slight effacement
anterior subarachnoid space. Borderline LEFT C7 foraminal narrowing.

C7-T1:  Unremarkable.
IMPRESSION: MRI brain shows no acute or significant intracranial findings. There
is a focal area of atrophy in the RIGHT gyrus rectus, likely
sequelae of old head trauma.

Central and rightward protrusion at C5-6. In conjunction with
reversal of the normal curve, slight cord flattening. RIGHT C6
foraminal narrowing.

Central and leftward protrusion at C6-7. Borderline LEFT C7
foraminal narrowing.

## 2019-12-12 ENCOUNTER — Other Ambulatory Visit: Payer: Self-pay | Admitting: Family Medicine

## 2019-12-12 MED FILL — ZOLPIDEM TARTRATE 5 MG TAB: 5 | 30 days supply | Qty: 30 | Fill #2

## 2019-12-12 MED FILL — PHENTERMINE 37.5 MG TABLET: 37.5 | 30 days supply | Qty: 30 | Fill #0

## 2019-12-12 NOTE — Telephone Encounter (Signed)
LR: 07-01-2019 Qty: 30 with 2 refills  Last office visit: 07-01-2019 Upcoming appointment: No pending appt

## 2020-01-22 MED FILL — ZOLPIDEM TARTRATE 5 MG TABS: 5 | 30 days supply | Qty: 30 | Fill #3

## 2020-02-06 MED FILL — PHENTERMINE 37.5 MG TABLET: 37.5 | 30 days supply | Qty: 30 | Fill #1

## 2020-03-03 ENCOUNTER — Other Ambulatory Visit: Payer: Self-pay | Admitting: Family Medicine

## 2020-03-03 DIAGNOSIS — F5101 Primary insomnia: Secondary | ICD-10-CM

## 2020-03-03 NOTE — Telephone Encounter (Signed)
Pt requesting Ambien 5mg  tab LOV: 07/01/2019 No future visits scheduled Last refill: 01/22/2020  Approve?

## 2020-03-04 ENCOUNTER — Other Ambulatory Visit: Payer: Self-pay | Admitting: Family Medicine

## 2020-03-04 MED FILL — ZOLPIDEM TARTRATE 5 MG TABS: 5 | 30 days supply | Qty: 30 | Fill #0

## 2020-03-12 MED FILL — PHENTERMINE 37.5 MG TABLET: 37.5 | 30 days supply | Qty: 30 | Fill #2

## 2020-03-17 ENCOUNTER — Other Ambulatory Visit: Payer: Self-pay | Admitting: Family Medicine

## 2020-04-29 ENCOUNTER — Other Ambulatory Visit: Payer: Self-pay | Admitting: Family Medicine

## 2020-04-29 MED FILL — ZOLPIDEM TARTRATE 5 MG TABS: 5 | 30 days supply | Qty: 30 | Fill #1

## 2020-04-30 NOTE — Telephone Encounter (Signed)
LAST APPOINTMENT DATE: 07/01/2019   NEXT APPOINTMENT DATE: Visit date not found    LAST REFILL: 0819/2021  QTY: 30 REF 2

## 2020-05-22 ENCOUNTER — Telehealth: Payer: Self-pay

## 2020-05-22 DIAGNOSIS — F5101 Primary insomnia: Secondary | ICD-10-CM

## 2020-05-22 NOTE — Telephone Encounter (Signed)
Pt is requesting Zolpidem Tartrate 5 mg tab LOV: 07/01/2019 No future visits scheduled Last refill: 03/04/2020  Approve?

## 2020-05-25 ENCOUNTER — Other Ambulatory Visit: Payer: Self-pay | Admitting: Family Medicine

## 2020-05-25 MED ORDER — ZOLPIDEM TARTRATE 5 MG PO TABS: 5.0000 mg | ORAL_TABLET | Freq: Every day | ORAL | 1 refills | Status: DC

## 2020-05-25 NOTE — Addendum Note (Signed)
Addended by: Orma Flaming on: 05/25/2020 11:51 AM   Modules accepted: Orders

## 2020-05-28 ENCOUNTER — Other Ambulatory Visit: Payer: Self-pay | Admitting: Obstetrics & Gynecology

## 2020-05-28 DIAGNOSIS — Z1231 Encounter for screening mammogram for malignant neoplasm of breast: Secondary | ICD-10-CM

## 2020-06-09 ENCOUNTER — Ambulatory Visit
Admission: RE | Admit: 2020-06-09 | Discharge: 2020-06-09 | Disposition: A | Discharge status: Home / Self Care | Payer: Self-pay | Source: Ambulatory Visit | Attending: Obstetrics & Gynecology | Admitting: Obstetrics & Gynecology

## 2020-06-09 ENCOUNTER — Other Ambulatory Visit: Payer: Self-pay

## 2020-06-09 DIAGNOSIS — Z1231 Encounter for screening mammogram for malignant neoplasm of breast: Secondary | ICD-10-CM | POA: Diagnosis not present

## 2020-06-12 LAB — HM MAMMOGRAPHY

## 2020-06-16 ENCOUNTER — Encounter: Payer: Self-pay | Admitting: Family Medicine

## 2020-06-25 ENCOUNTER — Other Ambulatory Visit (HOSPITAL_COMMUNITY): Payer: Self-pay | Admitting: Obstetrics & Gynecology

## 2020-06-25 DIAGNOSIS — Z01419 Encounter for gynecological examination (general) (routine) without abnormal findings: Secondary | ICD-10-CM | POA: Diagnosis not present

## 2020-06-25 DIAGNOSIS — Z124 Encounter for screening for malignant neoplasm of cervix: Secondary | ICD-10-CM | POA: Diagnosis not present

## 2020-06-25 DIAGNOSIS — Z6833 Body mass index (BMI) 33.0-33.9, adult: Secondary | ICD-10-CM | POA: Diagnosis not present

## 2020-06-25 MED FILL — PHENTERMINE 37.5 MG TABLET: 37.5 | 30 days supply | Qty: 30 | Fill #0

## 2020-06-30 LAB — HM PAP SMEAR

## 2020-07-22 MED FILL — ZOLPIDEM TARTRATE 5 MG TABS: 5 | 30 days supply | Qty: 30 | Fill #2

## 2020-07-23 MED FILL — PHENTERMINE 37.5 MG TABLET: 37.5 | 30 days supply | Qty: 30 | Fill #1

## 2020-07-30 ENCOUNTER — Other Ambulatory Visit (HOSPITAL_COMMUNITY): Payer: Self-pay

## 2020-09-01 ENCOUNTER — Other Ambulatory Visit (HOSPITAL_COMMUNITY): Payer: Self-pay

## 2020-09-01 ENCOUNTER — Other Ambulatory Visit: Payer: Self-pay

## 2020-09-01 ENCOUNTER — Other Ambulatory Visit: Payer: Self-pay | Admitting: Family Medicine

## 2020-09-01 ENCOUNTER — Telehealth: Payer: Self-pay

## 2020-09-01 MED ORDER — TRIAMTERENE-HCTZ 37.5-25 MG PO TABS
1.0000 | ORAL_TABLET | Freq: Every day | ORAL | 1 refills | Status: DC
  Filled 2020-09-01: qty 90, 90d supply, fill #0

## 2020-09-01 NOTE — Telephone Encounter (Signed)
.   LAST APPOINTMENT DATE: 09/01/2020   NEXT APPOINTMENT DATE:@Visit  date not found  MEDICATION:triamterene-hydrochlorothiazide (MAXZIDE-25) 37.5-25 MG tablet   PHARMACY:[id number] [address], [place] [address]

## 2020-09-02 NOTE — Telephone Encounter (Signed)
Medication was filled 09/01/2020.

## 2020-09-15 ENCOUNTER — Other Ambulatory Visit (HOSPITAL_COMMUNITY): Payer: Self-pay

## 2020-09-15 MED ORDER — PHENTERMINE HCL 37.5 MG PO TABS
ORAL_TABLET | ORAL | 2 refills | Status: DC
  Filled 2020-09-15: qty 30, 30d supply, fill #0
  Filled 2020-11-09: qty 30, 30d supply, fill #1
  Filled 2020-12-11: qty 30, 30d supply, fill #2

## 2020-09-15 MED ORDER — ZOLPIDEM TARTRATE 5 MG PO TABS
ORAL_TABLET | ORAL | 2 refills | Status: DC
  Filled 2020-09-15: qty 30, 30d supply, fill #0
  Filled 2020-10-20: qty 30, 30d supply, fill #1
  Filled 2020-11-20: qty 30, 30d supply, fill #2

## 2020-09-28 ENCOUNTER — Emergency Department (HOSPITAL_BASED_OUTPATIENT_CLINIC_OR_DEPARTMENT_OTHER)
Admission: EM | Admit: 2020-09-28 | Discharge: 2020-09-29 | Disposition: A | Discharge status: Home / Self Care | Payer: BC Managed Care – PPO | Attending: Emergency Medicine | Admitting: Emergency Medicine

## 2020-09-28 ENCOUNTER — Emergency Department (HOSPITAL_BASED_OUTPATIENT_CLINIC_OR_DEPARTMENT_OTHER): Payer: BC Managed Care – PPO | Admitting: Radiology

## 2020-09-28 ENCOUNTER — Other Ambulatory Visit: Payer: Self-pay

## 2020-09-28 ENCOUNTER — Emergency Department (HOSPITAL_BASED_OUTPATIENT_CLINIC_OR_DEPARTMENT_OTHER): Payer: BC Managed Care – PPO

## 2020-09-28 ENCOUNTER — Encounter (HOSPITAL_BASED_OUTPATIENT_CLINIC_OR_DEPARTMENT_OTHER): Payer: Self-pay

## 2020-09-28 DIAGNOSIS — R9431 Abnormal electrocardiogram [ECG] [EKG]: Secondary | ICD-10-CM | POA: Diagnosis not present

## 2020-09-28 DIAGNOSIS — Z79899 Other long term (current) drug therapy: Secondary | ICD-10-CM | POA: Insufficient documentation

## 2020-09-28 DIAGNOSIS — K449 Diaphragmatic hernia without obstruction or gangrene: Secondary | ICD-10-CM | POA: Diagnosis not present

## 2020-09-28 DIAGNOSIS — M47814 Spondylosis without myelopathy or radiculopathy, thoracic region: Secondary | ICD-10-CM | POA: Diagnosis not present

## 2020-09-28 DIAGNOSIS — I10 Essential (primary) hypertension: Secondary | ICD-10-CM | POA: Diagnosis not present

## 2020-09-28 DIAGNOSIS — M7989 Other specified soft tissue disorders: Secondary | ICD-10-CM | POA: Insufficient documentation

## 2020-09-28 DIAGNOSIS — R0789 Other chest pain: Secondary | ICD-10-CM | POA: Diagnosis not present

## 2020-09-28 DIAGNOSIS — R079 Chest pain, unspecified: Secondary | ICD-10-CM | POA: Diagnosis not present

## 2020-09-28 DIAGNOSIS — R790 Abnormal level of blood mineral: Secondary | ICD-10-CM | POA: Insufficient documentation

## 2020-09-28 DIAGNOSIS — R0989 Other specified symptoms and signs involving the circulatory and respiratory systems: Secondary | ICD-10-CM | POA: Diagnosis not present

## 2020-09-28 DIAGNOSIS — J9811 Atelectasis: Secondary | ICD-10-CM | POA: Diagnosis not present

## 2020-09-28 DIAGNOSIS — R7989 Other specified abnormal findings of blood chemistry: Secondary | ICD-10-CM

## 2020-09-28 LAB — CBC
HCT: 41.2 % (ref 36.0–46.0)
Hemoglobin: 14.1 g/dL (ref 12.0–15.0)
MCH: 30.3 pg (ref 26.0–34.0)
MCHC: 34.2 g/dL (ref 30.0–36.0)
MCV: 88.4 fL (ref 80.0–100.0)
Platelets: 228 10*3/uL (ref 150–400)
RBC: 4.66 MIL/uL (ref 3.87–5.11)
RDW: 12.3 % (ref 11.5–15.5)
WBC: 6.8 10*3/uL (ref 4.0–10.5)
nRBC: 0 % (ref 0.0–0.2)

## 2020-09-28 LAB — BASIC METABOLIC PANEL
Anion gap: 11 (ref 5–15)
BUN: 12 mg/dL (ref 6–20)
CO2: 28 mmol/L (ref 22–32)
Calcium: 9.3 mg/dL (ref 8.9–10.3)
Chloride: 101 mmol/L (ref 98–111)
Creatinine, Ser: 0.87 mg/dL (ref 0.44–1.00)
GFR, Estimated: >60 mL/min (ref >60)
Glucose, Bld: 90 mg/dL (ref 70–99)
Potassium: 3.1 mmol/L — ABNORMAL LOW (ref 3.5–5.1)
Sodium: 140 mmol/L (ref 135–145)

## 2020-09-28 LAB — TROPONIN I (HIGH SENSITIVITY): Troponin I (High Sensitivity): 4 ng/L (ref <18)

## 2020-09-28 LAB — BRAIN NATRIURETIC PEPTIDE: B Natriuretic Peptide: 17.9 pg/mL (ref 0.0–100.0)

## 2020-09-28 MED ORDER — IOHEXOL 350 MG/ML SOLN
100.0000 mL | Freq: Once | INTRAVENOUS | Status: AC | PRN
  Administered 2020-09-28: 75 mL via INTRAVENOUS

## 2020-09-28 NOTE — ED Provider Notes (Signed)
[facility] DEPT Provider Note   CSN: [id number] Arrival date & time: 09/28/20  1937     History Chief Complaint  Patient presents with  . Chest Pain  . Leg Swelling         [name] [name] is a 55 y.o. female.  Presented to the emergency room with concern for chest pain.  Patient reports that she has been having some intermittent mild chest discomfort since yesterday.  Across chest and on left side.  Currently not experiencing any pain.  States that after she went on a long car ride she noted some swelling in both of her legs.  States that the swelling has since gone down.  No pain in her legs.  Went to urgent care and had an elevated D-dimer and was told to go to the ER for a CT scan.  No difficulty in breathing.  Denies any chronic medical problems.  No family history of coronary artery disease.  Non-smoker.  HPI     Past Medical History:  Diagnosis Date  . Arthritis   . Chicken pox   . GERD (gastroesophageal reflux disease)   . Hypertension   . Liver lesion    Hepatic Adenoma  . Migraines     Patient Active Problem List   Diagnosis Date Noted  . Fatty liver 07/17/2019  . Migraines 04/14/2018  . Vitamin D deficiency 04/14/2018  . Primary insomnia 04/14/2018  . GERD (gastroesophageal reflux disease)   . Menopausal syndrome 04/13/2018  . Chronic pansinusitis 07/06/2016  . 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 Hypertension 01/23/2016  . OSA (obstructive sleep apnea) 06/22/2007  . 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 KNEE ARTHROSCOPY Right 01/2017  . NASAL SINUS SURGERY  02/2019     OB History   No obstetric history on file.     Family History  Problem Relation Age of Onset  . Cancer - Lung Father   . Diabetes Mother   . Hyperlipidemia Mother   . Cancer Maternal Grandmother   . Diabetes Maternal Grandmother   . Hyperlipidemia Maternal Grandmother   . Hypertension Maternal  Grandmother   . Diabetes Maternal Grandfather   . Hyperlipidemia Maternal Grandfather   . Cancer Paternal Grandmother   . Diabetes Paternal Grandmother   . Heart disease Paternal Grandmother   . Hyperlipidemia Paternal Grandmother   . Hypertension Paternal Grandmother   . Diabetes Sister   . Hyperlipidemia Sister   . Colon cancer Neg Hx   . Colon polyps Neg Hx   . Rectal cancer Neg Hx   . Stomach cancer Neg Hx   . Breast cancer Neg Hx     Social History   Tobacco Use  . Smoking status: Never Smoker  . Smokeless tobacco: Never Used  Substance Use Topics  . Alcohol use: Yes    Alcohol/week: 0.0 standard drinks    Comment: occas  . Drug use: No    Home Medications Prior to Admission medications   Medication Sig Start Date End Date Taking? Authorizing Provider  fluticasone (FLONASE) 50 MCG/ACT nasal spray Place 2 sprays into both nostrils daily. 01/11/19  Yes Orma Flaming, MD  phentermine (ADIPEX-P) 37.5 MG tablet TAKE 1 TABLET (37.5 MG TOTAL) BY MOUTH DAILY BEFORE BREAKFAST. 12/12/19  Yes Orma Flaming, MD  phentermine (ADIPEX-P) 37.5 MG tablet TAKE 1 TABLET BY MOUTH EVERY MORNING 06/25/20 12/22/20 Yes Vania Rea, MD  phentermine (ADIPEX-P) 37.5 MG tablet Take  1 tablet by mouth every morning. 09/15/20  Yes   triamterene-hydrochlorothiazide (MAXZIDE-25) 37.5-25 MG tablet Take 1 tablet by mouth daily. 09/01/20  Yes Orma Flaming, MD  zolpidem (AMBIEN) 5 MG tablet TAKE 1 TABLET BY MOUTH AT BEDTIME 05/25/20 11/21/20 Yes Orma Flaming, MD  zolpidem (AMBIEN) 5 MG tablet Take 1 tablet by mouth nightly at bedtime. 09/15/20  Yes   zolpidem (AMBIEN) 5 MG tablet TAKE 1 TABLET BY MOUTH AT BEDTIME 03/04/20 08/31/20  Orma Flaming, MD    Allergies    Patient has no known allergies.  Review of Systems   Review of Systems  Constitutional: Negative for chills and fever.  HENT: Negative for ear pain and sore throat.   Eyes: Negative for pain and visual disturbance.  Respiratory: Negative for  cough and shortness of breath.   Cardiovascular: Positive for chest pain. Negative for palpitations.  Gastrointestinal: Negative for abdominal pain and vomiting.  Genitourinary: Negative for dysuria and hematuria.  Musculoskeletal: Negative for arthralgias and back pain.  Skin: Negative for color change and rash.  Neurological: Negative for seizures and syncope.  All other systems reviewed and are negative.   Physical Exam Updated Vital Signs BP (!) 143/89   Pulse 69   Temp 98 F (36.7 C) (Oral)   Resp 15   Ht 5\' 4"  (1.626 m)   Wt 87.5 kg   LMP  (LMP Unknown)   SpO2 99%   BMI 33.13 kg/m   Physical Exam Vitals and nursing note reviewed.  Constitutional:      General: She is not in acute distress.    Appearance: She is well-developed.  HENT:     Head: Normocephalic and atraumatic.  Eyes:     Conjunctiva/sclera: Conjunctivae normal.  Cardiovascular:     Rate and Rhythm: Normal rate and regular rhythm.     Heart sounds: No murmur heard.   Pulmonary:     Effort: Pulmonary effort is normal. No respiratory distress.     Breath sounds: Normal breath sounds.  Abdominal:     Palpations: Abdomen is soft.     Tenderness: There is no abdominal tenderness.  Musculoskeletal:     Cervical back: Neck supple.     Right lower leg: No edema.     Left lower leg: No edema.     Comments: No tenderness to palpation throughout legs, no edema appreciated, warm, DP and PT pulses intact bilaterally  Skin:    General: Skin is warm and dry.  Neurological:     General: No focal deficit present.     Mental Status: She is alert.     ED Results / Procedures / Treatments   Labs (all labs ordered are listed, but only abnormal results are displayed) Labs Reviewed  BASIC METABOLIC PANEL - Abnormal; Notable for the following components:      Result Value   Potassium 3.1 (*)    All other components within normal limits  CBC  PREGNANCY, URINE  BRAIN NATRIURETIC PEPTIDE  TROPONIN I (HIGH  SENSITIVITY)  TROPONIN I (HIGH SENSITIVITY)    EKG None  Radiology DG Chest 2 View  Result Date: 09/28/2020 CLINICAL DATA:  Chest tightness, lower extremity swelling EXAM: CHEST - 2 VIEW COMPARISON:  09/28/2020 chest radiograph. FINDINGS: Stable cardiomediastinal silhouette with normal heart size. No pneumothorax. No pleural effusion. Lungs appear clear, with no acute consolidative airspace disease and no pulmonary edema. IMPRESSION: No active cardiopulmonary disease. Electronically Signed   By: Ilona Sorrel M.D.   On: 09/28/2020  20:36   CT Angio Chest PE W and/or Wo Contrast  Result Date: 09/28/2020 CLINICAL DATA:  PE suspected, high prob chest pain, sob, leg swelling and elevated d dimer EXAM: CT ANGIOGRAPHY CHEST WITH CONTRAST TECHNIQUE: Multidetector CT imaging of the chest was performed using the standard protocol during bolus administration of intravenous contrast. Multiplanar CT image reconstructions and MIPs were obtained to evaluate the vascular anatomy. CONTRAST:  39mL OMNIPAQUE IOHEXOL 350 MG/ML SOLN COMPARISON:  Radiograph earlier today. FINDINGS: Cardiovascular: There are no filling defects within the pulmonary arteries to suggest pulmonary embolus. The thoracic aorta is normal in caliber. Exam not tailored for aortic evaluation, no evidence of dissection. The heart is normal in size. There is no pericardial effusion. Mediastinum/Nodes: No mediastinal or hilar adenopathy. No thyroid nodule. Small hiatal hernia. No esophageal wall thickening. Lungs/Pleura: No acute or focal airspace disease. Minor perifissural subsegmental atelectasis in the right lower lobe. No pulmonary nodule or mass. No pleural fluid. No findings of pulmonary edema. The trachea and central bronchi are patent. Upper Abdomen: No acute findings. Musculoskeletal: Thoracic spondylosis with multilevel spurring. Schmorl's node in the superior endplate of O03. There are no acute or suspicious osseous abnormalities. Review of the  MIP images confirms the above findings. IMPRESSION: 1. No pulmonary embolus or acute intrathoracic abnormality. 2. Small hiatal hernia. Electronically Signed   By: Keith Rake M.D.   On: 09/28/2020 22:32    Procedures Procedures   Medications Ordered in ED Medications  iohexol (OMNIPAQUE) 350 MG/ML injection 100 mL (75 mLs Intravenous Contrast Given 09/28/20 2213)    ED Course  I have reviewed the triage vital signs and the nursing notes.  Pertinent labs & imaging results that were available during my care of the patient were reviewed by me and considered in my medical decision making (see chart for details).    MDM Rules/Calculators/A&P                         55 year old lady presents to ER with report of chest pain, leg swelling and elevated D-dimer.  On exam patient appears well in no distress.  Not having any ongoing pain and denies any ongoing leg swelling.  Both of her legs appear normal, neurovascularly intact.  Given current appearance of legs, do not feel she requires DVT study.  EKG without acute ischemic change.  CT chest negative for pulmonary embolism.  At time of signout, troponin pending.  If normal, anticipate discharge.  Final Clinical Impression(s) / ED Diagnoses Final diagnoses:  Chest pain, unspecified type  Positive D dimer    Rx / DC Orders ED Discharge Orders    None       Lucrezia Starch, MD 09/28/20 2249

## 2020-09-28 NOTE — Discharge Instructions (Addendum)
Follow-up with your primary doctor.  Return to ER if you develop worsening chest pain, difficulty breathing or other new concerning symptom.

## 2020-09-28 NOTE — ED Triage Notes (Signed)
Pt c/o chest tightness since yesterday. Pt also c/o bilateral leg swelling. Pt states she recently went on a 6 hr driver and when she arrived her legs were swollen. The swelling as since gone done but the discomfort in her chest is still there.

## 2020-09-29 ENCOUNTER — Other Ambulatory Visit (HOSPITAL_COMMUNITY): Payer: Self-pay

## 2020-09-29 ENCOUNTER — Ambulatory Visit (INDEPENDENT_AMBULATORY_CARE_PROVIDER_SITE_OTHER): Payer: BC Managed Care – PPO | Admitting: Family Medicine

## 2020-09-29 ENCOUNTER — Encounter: Payer: Self-pay | Admitting: Family Medicine

## 2020-09-29 VITALS — BP 110/75 | HR 95 | Temp 98.3°F | Ht 64.0 in | Wt 191.2 lb

## 2020-09-29 DIAGNOSIS — I10 Essential (primary) hypertension: Secondary | ICD-10-CM

## 2020-09-29 DIAGNOSIS — E876 Hypokalemia: Secondary | ICD-10-CM | POA: Diagnosis not present

## 2020-09-29 DIAGNOSIS — R945 Abnormal results of liver function studies: Secondary | ICD-10-CM

## 2020-09-29 DIAGNOSIS — R7989 Other specified abnormal findings of blood chemistry: Secondary | ICD-10-CM

## 2020-09-29 DIAGNOSIS — E538 Deficiency of other specified B group vitamins: Secondary | ICD-10-CM

## 2020-09-29 DIAGNOSIS — E559 Vitamin D deficiency, unspecified: Secondary | ICD-10-CM | POA: Diagnosis not present

## 2020-09-29 DIAGNOSIS — R9431 Abnormal electrocardiogram [ECG] [EKG]: Secondary | ICD-10-CM | POA: Diagnosis not present

## 2020-09-29 MED ORDER — AMLODIPINE BESYLATE 10 MG PO TABS
10.0000 mg | ORAL_TABLET | Freq: Every day | ORAL | 5 refills | Status: DC
  Filled 2020-09-29: qty 90, 90d supply, fill #0

## 2020-09-29 MED ORDER — POTASSIUM CHLORIDE CRYS ER 10 MEQ PO TBCR
10.0000 meq | EXTENDED_RELEASE_TABLET | Freq: Two times a day (BID) | ORAL | 0 refills | Status: DC
  Filled 2020-09-29: qty 60, 30d supply, fill #0

## 2020-09-29 NOTE — Assessment & Plan Note (Signed)
At goal.  We will be stopping her Maxide and starting amlodipine as above.

## 2020-09-29 NOTE — Progress Notes (Signed)
   [name] [name] is a 55 y.o. female who presents today for an office visit.  Assessment/Plan:  New/Acute Problems: Hypokalemia Likely causing her symptoms.  She had complete work-up in the ED yesterday which was otherwise unremarkable.  We will switch off her Maxide and start amlodipine.  Also start potassium replacement 10 mEq twice daily for the next 1 to 2 weeks.  She will come back in a couple weeks to recheck BMET and also update [place] on her symptoms.  Discussed reasons to return to care earlier.  Chronic Problems Addressed Today: Hypertension At goal.  We will be stopping her Maxide and starting amlodipine as above.  Vitamin D deficiency Check vitamin D next blood draw.  B12 Deficiency Check B12 next blood draw.     Subjective:  HPI:  Patient here for ED follow-up.  Patient has not been feeling herself recently.  Went to the urgent care yesterday.  Found to have elevated D-dimer.  Sent to the ED.  Had work-up there including EKG and CTA.  These were both normal.  Was noted to have low potassium.  She was discharged home.  Still has some intermittent tingling is in her extremities and face.  Some more fatigue.  Some increase shortness of breath.  She had something similar happen a few years ago due to low potassium.  This improved after her potassium was replaced.  She is concerned her blood pressure medication may be causing low potassium.       Objective:  Physical Exam: BP 110/75   Pulse 95   Temp 98.3 F (36.8 C) (Temporal)   Ht 5\' 4"  (1.626 m)   Wt 191 lb 3.2 oz (86.7 kg)   LMP  (LMP Unknown)   SpO2 96%   BMI 32.82 kg/m   Gen: No acute distress, resting comfortably CV: Regular rate and rhythm with no murmurs appreciated Pulm: Normal work of breathing, clear to auscultation bilaterally with no crackles, wheezes, or rhonchi Neuro: Grossly normal, moves all extremities Psych: Normal affect and thought content  Time Spent: 45 minutes of total time was spent on  the date of the encounter performing the following actions: chart review prior to seeing the patient including recent visits with her PCP and at the ED/Urgent care, obtaining history, performing a medically necessary exam, counseling on the treatment plan, placing orders, and documenting in our EHR.       Algis Greenhouse. Jerline Pain, MD 09/29/2020 2:06 PM

## 2020-09-29 NOTE — Patient Instructions (Signed)
It was very nice to see you today!  I think a lot of your symptoms are due to your low potassium.  Please stop the blood pressure medication you are taking and start amlodipine 10 mg daily.  You can also take the potassium supplement.  I would like to see you back in 1 to 2 weeks for a blood pressure recheck and to recheck your labs.  Take care, Dr Jerline Pain  PLEASE NOTE:  If you had any lab tests please let us know if you have not heard back within a few days. You may see your results on mychart before we have a chance to review them but we will give you a call once they are reviewed by [place]. If we ordered any referrals today, please let us know if you have not heard from their office within the next week.   Please try these tips to maintain a healthy lifestyle:   Eat at least 3 REAL meals and 1-2 snacks per day.  Aim for no more than 5 hours between eating.  If you eat breakfast, please do so within one hour of getting up.    Each meal should contain half fruits/vegetables, one quarter protein, and one quarter carbs (no bigger than a computer mouse)   Cut down on sweet beverages. This includes juice, soda, and sweet tea.     Drink at least 1 glass of water with each meal and aim for at least 8 glasses per day   Exercise at least 150 minutes every week.

## 2020-09-29 NOTE — Assessment & Plan Note (Signed)
Check vitamin D next blood draw. 

## 2020-10-06 ENCOUNTER — Other Ambulatory Visit (HOSPITAL_COMMUNITY): Payer: Self-pay

## 2020-10-07 ENCOUNTER — Encounter: Payer: Self-pay | Admitting: Family Medicine

## 2020-10-08 ENCOUNTER — Other Ambulatory Visit: Payer: Self-pay | Admitting: *Deleted

## 2020-10-08 MED ORDER — HYDROCHLOROTHIAZIDE 25 MG PO TABS: 25.0000 mg | ORAL_TABLET | Freq: Every day | ORAL | 3 refills | Status: DC

## 2020-10-08 MED ORDER — HYDROCHLOROTHIAZIDE 50 MG PO TABS: 50.0000 mg | ORAL_TABLET | Freq: Every day | ORAL | 1 refills | Status: DC

## 2020-10-08 NOTE — Telephone Encounter (Signed)
Spoke with patient, stated experiencing edema in legs ankles and feet due to Rx Amlodipine  Notified Rx changes, Order HCTZ 25mg  send to [facility] as patient requested  Allergies updated on patient charge

## 2020-10-20 ENCOUNTER — Other Ambulatory Visit (HOSPITAL_COMMUNITY): Payer: Self-pay

## 2020-10-21 ENCOUNTER — Other Ambulatory Visit: Payer: BC Managed Care – PPO

## 2020-10-22 ENCOUNTER — Other Ambulatory Visit (INDEPENDENT_AMBULATORY_CARE_PROVIDER_SITE_OTHER): Payer: BC Managed Care – PPO

## 2020-10-22 DIAGNOSIS — I10 Essential (primary) hypertension: Secondary | ICD-10-CM

## 2020-10-22 DIAGNOSIS — E559 Vitamin D deficiency, unspecified: Secondary | ICD-10-CM | POA: Diagnosis not present

## 2020-10-22 DIAGNOSIS — E876 Hypokalemia: Secondary | ICD-10-CM | POA: Diagnosis not present

## 2020-10-22 DIAGNOSIS — E538 Deficiency of other specified B group vitamins: Secondary | ICD-10-CM

## 2020-10-22 LAB — BASIC METABOLIC PANEL
BUN: 15 mg/dL (ref 6–23)
CO2: 28 mEq/L (ref 19–32)
Calcium: 9.6 mg/dL (ref 8.4–10.5)
Chloride: 102 mEq/L (ref 96–112)
Creatinine, Ser: 0.86 mg/dL (ref 0.40–1.20)
GFR: 75.5 mL/min (ref >60.00)
Glucose, Bld: 86 mg/dL (ref 70–99)
Potassium: 3.6 mEq/L (ref 3.5–5.1)
Sodium: 139 mEq/L (ref 135–145)

## 2020-10-22 LAB — VITAMIN D 25 HYDROXY (VIT D DEFICIENCY, FRACTURES): VITD: 23.25 ng/mL — ABNORMAL LOW (ref 30.00–100.00)

## 2020-10-22 LAB — VITAMIN B12: Vitamin B-12: >1550 pg/mL — ABNORMAL HIGH (ref 211–911)

## 2020-10-27 NOTE — Progress Notes (Signed)
Please inform patient of the following:  Vitamin D a bit low.  Potassium back to normal.  B12 is not low.  Recommend starting 1000 IUs of vitamin D daily and having this level rechecked in 3 to 6 months.

## 2020-10-29 ENCOUNTER — Other Ambulatory Visit: Payer: Self-pay

## 2020-10-29 DIAGNOSIS — R7989 Other specified abnormal findings of blood chemistry: Secondary | ICD-10-CM

## 2020-10-29 DIAGNOSIS — R945 Abnormal results of liver function studies: Secondary | ICD-10-CM

## 2020-10-29 NOTE — Addendum Note (Signed)
Addended by: Cranston Neighbor on: 10/29/2020 04:39 PM   Modules accepted: Orders

## 2020-10-30 ENCOUNTER — Other Ambulatory Visit: Payer: Self-pay

## 2020-10-30 DIAGNOSIS — R945 Abnormal results of liver function studies: Secondary | ICD-10-CM

## 2020-10-30 DIAGNOSIS — R7989 Other specified abnormal findings of blood chemistry: Secondary | ICD-10-CM

## 2020-11-10 ENCOUNTER — Other Ambulatory Visit (HOSPITAL_COMMUNITY): Payer: Self-pay

## 2020-11-17 ENCOUNTER — Other Ambulatory Visit (INDEPENDENT_AMBULATORY_CARE_PROVIDER_SITE_OTHER): Payer: BC Managed Care – PPO

## 2020-11-17 DIAGNOSIS — R945 Abnormal results of liver function studies: Secondary | ICD-10-CM

## 2020-11-17 DIAGNOSIS — R7989 Other specified abnormal findings of blood chemistry: Secondary | ICD-10-CM

## 2020-11-17 LAB — COMPREHENSIVE METABOLIC PANEL
ALT: 36 U/L — ABNORMAL HIGH (ref 0–35)
AST: 30 U/L (ref 0–37)
Albumin: 4.2 g/dL (ref 3.5–5.2)
Alkaline Phosphatase: 75 U/L (ref 39–117)
BUN: 15 mg/dL (ref 6–23)
CO2: 27 mEq/L (ref 19–32)
Calcium: 9.5 mg/dL (ref 8.4–10.5)
Chloride: 101 mEq/L (ref 96–112)
Creatinine, Ser: 0.85 mg/dL (ref 0.40–1.20)
GFR: 76.53 mL/min (ref >60.00)
Glucose, Bld: 101 mg/dL — ABNORMAL HIGH (ref 70–99)
Potassium: 3.1 mEq/L — ABNORMAL LOW (ref 3.5–5.1)
Sodium: 138 mEq/L (ref 135–145)
Total Bilirubin: 0.9 mg/dL (ref 0.2–1.2)
Total Protein: 7 g/dL (ref 6.0–8.3)

## 2020-11-17 LAB — VITAMIN B12: Vitamin B-12: >1550 pg/mL — ABNORMAL HIGH (ref 211–911)

## 2020-11-18 ENCOUNTER — Other Ambulatory Visit (HOSPITAL_COMMUNITY): Payer: Self-pay

## 2020-11-18 ENCOUNTER — Other Ambulatory Visit: Payer: Self-pay | Admitting: Family Medicine

## 2020-11-18 ENCOUNTER — Other Ambulatory Visit: Payer: Self-pay

## 2020-11-18 DIAGNOSIS — R7989 Other specified abnormal findings of blood chemistry: Secondary | ICD-10-CM

## 2020-11-18 DIAGNOSIS — E876 Hypokalemia: Secondary | ICD-10-CM

## 2020-11-18 DIAGNOSIS — R945 Abnormal results of liver function studies: Secondary | ICD-10-CM

## 2020-11-18 MED ORDER — POTASSIUM CHLORIDE CRYS ER 20 MEQ PO TBCR
20.0000 meq | EXTENDED_RELEASE_TABLET | Freq: Every day | ORAL | 3 refills | Status: DC
  Filled 2020-11-18: qty 30, 30d supply, fill #0
  Filled 2020-12-13: qty 30, 30d supply, fill #1
  Filled 2021-01-15: qty 30, 30d supply, fill #2

## 2020-11-18 NOTE — Progress Notes (Signed)
Please inform patient of the following:  Potassium is low again. This is probably due to the HCTZ. We can either switch her to a different BP med or we can have her take a daily potassium supplement.  Her liver numbers are better but still slightly elevated. I know she had a liver ultrasound last year but we probably should repeat imaging. Radiology recommended abdominal MRI with and without contrast to further evaluate. Please place order.  Her B12 is still high. This is probably due to the liver issues. Please make sure she has not taken any B12 supplements recently. I would like to check her liver first as above but if still no explanation then we will have to look for other possible causes if she is not taking any supplements.   [name] [name]. Jerline Pain, MD 11/18/2020 9:52 AM

## 2020-11-19 NOTE — Addendum Note (Signed)
Addended by: Betti Cruz on: 11/19/2020 08:29 AM   Modules accepted: Orders

## 2020-11-20 ENCOUNTER — Other Ambulatory Visit (HOSPITAL_COMMUNITY): Payer: Self-pay

## 2020-12-03 ENCOUNTER — Other Ambulatory Visit: Payer: Self-pay

## 2020-12-11 ENCOUNTER — Other Ambulatory Visit (HOSPITAL_COMMUNITY): Payer: Self-pay

## 2020-12-14 ENCOUNTER — Other Ambulatory Visit (HOSPITAL_COMMUNITY): Payer: Self-pay

## 2020-12-17 ENCOUNTER — Other Ambulatory Visit (INDEPENDENT_AMBULATORY_CARE_PROVIDER_SITE_OTHER): Payer: BC Managed Care – PPO

## 2020-12-17 ENCOUNTER — Other Ambulatory Visit: Payer: Self-pay

## 2020-12-17 DIAGNOSIS — E876 Hypokalemia: Secondary | ICD-10-CM | POA: Diagnosis not present

## 2020-12-17 LAB — COMPREHENSIVE METABOLIC PANEL
ALT: 43 U/L — ABNORMAL HIGH (ref 0–35)
AST: 37 U/L (ref 0–37)
Albumin: 4 g/dL (ref 3.5–5.2)
Alkaline Phosphatase: 77 U/L (ref 39–117)
BUN: 18 mg/dL (ref 6–23)
CO2: 27 mEq/L (ref 19–32)
Calcium: 9.5 mg/dL (ref 8.4–10.5)
Chloride: 101 mEq/L (ref 96–112)
Creatinine, Ser: 0.86 mg/dL (ref 0.40–1.20)
GFR: 75.42 mL/min (ref >60.00)
Glucose, Bld: 92 mg/dL (ref 70–99)
Potassium: 3.5 mEq/L (ref 3.5–5.1)
Sodium: 137 mEq/L (ref 135–145)
Total Bilirubin: 0.7 mg/dL (ref 0.2–1.2)
Total Protein: 7.2 g/dL (ref 6.0–8.3)

## 2020-12-17 NOTE — Addendum Note (Signed)
Addended by: Loura Back on: 12/17/2020 09:53 AM   Modules accepted: Orders

## 2020-12-22 NOTE — Progress Notes (Signed)
Please inform patient of the following:  Potassium level back to normal.  Do not need to do any further testing at this point.

## 2020-12-31 ENCOUNTER — Other Ambulatory Visit: Payer: Self-pay

## 2020-12-31 ENCOUNTER — Other Ambulatory Visit (INDEPENDENT_AMBULATORY_CARE_PROVIDER_SITE_OTHER): Payer: BC Managed Care – PPO

## 2020-12-31 DIAGNOSIS — R7989 Other specified abnormal findings of blood chemistry: Secondary | ICD-10-CM

## 2020-12-31 DIAGNOSIS — R945 Abnormal results of liver function studies: Secondary | ICD-10-CM

## 2020-12-31 DIAGNOSIS — E538 Deficiency of other specified B group vitamins: Secondary | ICD-10-CM

## 2020-12-31 LAB — VITAMIN B12: Vitamin B-12: 1137 pg/mL — ABNORMAL HIGH (ref 211–911)

## 2021-01-04 NOTE — Progress Notes (Signed)
Please inform patient of the following:  B12 levels are improving.  It is  only slightly above normal cutoff range.  We need to get her abdominal MRI as we discussed about a month ago.  Can we please check on the status of this?

## 2021-01-15 ENCOUNTER — Other Ambulatory Visit (HOSPITAL_COMMUNITY): Payer: Self-pay

## 2021-02-15 ENCOUNTER — Other Ambulatory Visit (HOSPITAL_COMMUNITY): Payer: Self-pay

## 2021-02-15 MED ORDER — ZOLPIDEM TARTRATE 5 MG PO TABS
ORAL_TABLET | ORAL | 1 refills | Status: DC
  Filled 2021-02-15: qty 30, 30d supply, fill #0
  Filled 2021-03-16: qty 30, 30d supply, fill #1

## 2021-03-05 ENCOUNTER — Other Ambulatory Visit (HOSPITAL_COMMUNITY): Payer: Self-pay

## 2021-03-05 DIAGNOSIS — E669 Obesity, unspecified: Secondary | ICD-10-CM | POA: Diagnosis not present

## 2021-03-05 DIAGNOSIS — R635 Abnormal weight gain: Secondary | ICD-10-CM | POA: Diagnosis not present

## 2021-03-05 DIAGNOSIS — R7303 Prediabetes: Secondary | ICD-10-CM | POA: Diagnosis not present

## 2021-03-05 DIAGNOSIS — I10 Essential (primary) hypertension: Secondary | ICD-10-CM | POA: Diagnosis not present

## 2021-03-05 MED ORDER — HYDROCHLOROTHIAZIDE 25 MG PO TABS
25.0000 mg | ORAL_TABLET | Freq: Every day | ORAL | 0 refills | Status: DC
  Filled 2021-03-05: qty 90, 90d supply, fill #0

## 2021-03-05 MED ORDER — POTASSIUM CHLORIDE CRYS ER 20 MEQ PO TBCR
20.0000 meq | EXTENDED_RELEASE_TABLET | Freq: Every day | ORAL | 0 refills | Status: DC
  Filled 2021-03-05: qty 90, 90d supply, fill #0

## 2021-03-05 MED ORDER — LISINOPRIL 10 MG PO TABS
10.0000 mg | ORAL_TABLET | Freq: Every day | ORAL | 0 refills | Status: DC
  Filled 2021-03-05: qty 90, 90d supply, fill #0

## 2021-03-15 ENCOUNTER — Other Ambulatory Visit (HOSPITAL_COMMUNITY): Payer: Self-pay

## 2021-03-16 ENCOUNTER — Other Ambulatory Visit (HOSPITAL_COMMUNITY): Payer: Self-pay

## 2021-03-23 ENCOUNTER — Encounter: Payer: BC Managed Care – PPO | Admitting: Physician Assistant

## 2021-04-15 ENCOUNTER — Other Ambulatory Visit (HOSPITAL_COMMUNITY): Payer: Self-pay

## 2021-04-15 DIAGNOSIS — J019 Acute sinusitis, unspecified: Secondary | ICD-10-CM | POA: Diagnosis not present

## 2021-04-15 MED ORDER — METHYLPREDNISOLONE 4 MG PO TBPK
ORAL_TABLET | ORAL | 0 refills | Status: DC
  Filled 2021-04-15: qty 21, 6d supply, fill #0

## 2021-04-15 MED ORDER — CEFDINIR 300 MG PO CAPS
300.0000 mg | ORAL_CAPSULE | Freq: Two times a day (BID) | ORAL | 0 refills | Status: DC
  Filled 2021-04-15: qty 14, 7d supply, fill #0

## 2021-04-28 ENCOUNTER — Other Ambulatory Visit (HOSPITAL_COMMUNITY): Payer: Self-pay

## 2021-04-28 MED ORDER — ZOLPIDEM TARTRATE 5 MG PO TABS
ORAL_TABLET | ORAL | 0 refills | Status: DC
  Filled 2021-04-28: qty 30, 30d supply, fill #0

## 2021-05-11 ENCOUNTER — Other Ambulatory Visit (HOSPITAL_COMMUNITY): Payer: Self-pay

## 2021-05-11 DIAGNOSIS — I10 Essential (primary) hypertension: Secondary | ICD-10-CM | POA: Diagnosis not present

## 2021-05-11 DIAGNOSIS — R059 Cough, unspecified: Secondary | ICD-10-CM | POA: Diagnosis not present

## 2021-05-11 DIAGNOSIS — K760 Fatty (change of) liver, not elsewhere classified: Secondary | ICD-10-CM | POA: Diagnosis not present

## 2021-05-11 MED ORDER — OLMESARTAN MEDOXOMIL 5 MG PO TABS
ORAL_TABLET | ORAL | 3 refills | Status: DC
  Filled 2021-05-11: qty 90, 90d supply, fill #0

## 2021-05-11 MED ORDER — PANTOPRAZOLE SODIUM 40 MG PO TBEC
DELAYED_RELEASE_TABLET | ORAL | 3 refills | Status: DC
  Filled 2021-05-11: qty 90, 90d supply, fill #0
  Filled 2021-08-16: qty 90, 90d supply, fill #1
  Filled 2022-04-29: qty 90, 90d supply, fill #2

## 2021-05-13 DIAGNOSIS — K760 Fatty (change of) liver, not elsewhere classified: Secondary | ICD-10-CM | POA: Diagnosis not present

## 2021-05-13 DIAGNOSIS — R197 Diarrhea, unspecified: Secondary | ICD-10-CM | POA: Diagnosis not present

## 2021-05-24 ENCOUNTER — Other Ambulatory Visit: Payer: Self-pay | Admitting: Obstetrics & Gynecology

## 2021-05-24 DIAGNOSIS — Z1231 Encounter for screening mammogram for malignant neoplasm of breast: Secondary | ICD-10-CM

## 2021-05-28 ENCOUNTER — Other Ambulatory Visit (HOSPITAL_COMMUNITY): Payer: Self-pay

## 2021-05-28 MED ORDER — ZOLPIDEM TARTRATE 5 MG PO TABS
ORAL_TABLET | ORAL | 0 refills | Status: DC
  Filled 2021-05-28: qty 30, 30d supply, fill #0

## 2021-06-02 DIAGNOSIS — I10 Essential (primary) hypertension: Secondary | ICD-10-CM | POA: Diagnosis not present

## 2021-06-02 DIAGNOSIS — E559 Vitamin D deficiency, unspecified: Secondary | ICD-10-CM | POA: Diagnosis not present

## 2021-06-02 DIAGNOSIS — E538 Deficiency of other specified B group vitamins: Secondary | ICD-10-CM | POA: Diagnosis not present

## 2021-06-02 DIAGNOSIS — Z79899 Other long term (current) drug therapy: Secondary | ICD-10-CM | POA: Diagnosis not present

## 2021-06-09 ENCOUNTER — Other Ambulatory Visit (HOSPITAL_COMMUNITY): Payer: Self-pay

## 2021-06-09 DIAGNOSIS — Z23 Encounter for immunization: Secondary | ICD-10-CM | POA: Diagnosis not present

## 2021-06-09 DIAGNOSIS — I10 Essential (primary) hypertension: Secondary | ICD-10-CM | POA: Diagnosis not present

## 2021-06-09 DIAGNOSIS — Z0000 Encounter for general adult medical examination without abnormal findings: Secondary | ICD-10-CM | POA: Diagnosis not present

## 2021-06-09 DIAGNOSIS — Z1339 Encounter for screening examination for other mental health and behavioral disorders: Secondary | ICD-10-CM | POA: Diagnosis not present

## 2021-06-09 DIAGNOSIS — R7303 Prediabetes: Secondary | ICD-10-CM | POA: Diagnosis not present

## 2021-06-09 DIAGNOSIS — Z1331 Encounter for screening for depression: Secondary | ICD-10-CM | POA: Diagnosis not present

## 2021-06-09 MED ORDER — LISINOPRIL 10 MG PO TABS
ORAL_TABLET | ORAL | 3 refills | Status: DC
  Filled 2021-06-09: qty 90, 90d supply, fill #0

## 2021-06-17 ENCOUNTER — Other Ambulatory Visit: Payer: Self-pay

## 2021-06-17 ENCOUNTER — Ambulatory Visit
Admission: RE | Admit: 2021-06-17 | Discharge: 2021-06-17 | Disposition: A | Discharge status: Home / Self Care | Payer: BC Managed Care – PPO | Source: Ambulatory Visit | Attending: Obstetrics & Gynecology | Admitting: Obstetrics & Gynecology

## 2021-06-17 DIAGNOSIS — Z1231 Encounter for screening mammogram for malignant neoplasm of breast: Secondary | ICD-10-CM

## 2021-06-22 ENCOUNTER — Other Ambulatory Visit (HOSPITAL_COMMUNITY): Payer: Self-pay

## 2021-06-22 ENCOUNTER — Encounter: Payer: Self-pay | Admitting: Pulmonary Disease

## 2021-06-22 ENCOUNTER — Other Ambulatory Visit: Payer: Self-pay

## 2021-06-22 ENCOUNTER — Ambulatory Visit: Payer: BC Managed Care – PPO | Admitting: Pulmonary Disease

## 2021-06-22 VITALS — BP 122/62 | HR 95 | Temp 97.7°F | Ht 64.0 in | Wt 204.4 lb

## 2021-06-22 DIAGNOSIS — R053 Chronic cough: Secondary | ICD-10-CM

## 2021-06-22 MED ORDER — BUDESONIDE-FORMOTEROL FUMARATE 160-4.5 MCG/ACT IN AERO
2.0000 | INHALATION_SPRAY | Freq: Two times a day (BID) | RESPIRATORY_TRACT | 5 refills | Status: DC
  Filled 2021-06-22: qty 10.2, 30d supply, fill #0
  Filled 2022-05-31: qty 10.2, 30d supply, fill #1

## 2021-06-22 NOTE — Progress Notes (Signed)
[name] [name]    [id number]    [birth year]  Primary Care Physician:Wile, Jesse Sans, MD  Referring Physician: Michael Boston, MD [address] [place],  [place] [address]  Chief complaint: Consult for chronic cough  HPI: 56 year old with history of hypertension, GERD Complains of chronic cough since November 2023.  Cough is usually nonproductive but has occasional mucus.  Denies any dyspnea She is treated for seasonal allergies, postnasal drip with Flonase.  Zyrtec made her sleepy.  She has tried chlorpheniramine twice daily recently with no improvement in cough.  Has history GERD with symptoms of acid reflux on laying down at night.  Pepcid was stopped and she was started on Protonix for better management.  She states that symptoms of reflux have improved.  She was on lisinopril which was held for 10 days and started on olmesartan.  However she needed to go back on lisinopril as she had swelling of the feet with no improvement in cough.  History notable for OSA diagnosed around 2007.  She was on CPAP briefly but stopped as she could not tolerate the mask  Pets: Dog Occupation: Accountant Exposures: No mold, hot tub, Jacuzzi.  No feather pillows or comforters Smoking history: Never smoker Travel history: No significant travel history Relevant family history: No family history of lung disease  Outpatient Encounter Medications as of 06/22/2021  Medication Sig   hydrochlorothiazide (HYDRODIURIL) 25 MG tablet Take 1 tablet (25 mg total) by mouth daily.   lisinopril (ZESTRIL) 10 MG tablet Take 1 tablet by mouth once a day.   olmesartan (BENICAR) 5 MG tablet Take 1 tablet by mouth once a day for blood pressure   pantoprazole (PROTONIX) 40 MG tablet Take 1 tablet by mouth once a day   phentermine (ADIPEX-P) 37.5 MG tablet Take 1 tablet by mouth every morning.   potassium chloride SA (KLOR-CON) 20 MEQ tablet Take 1 tablet (20 mEq total) by mouth daily. (Patient taking differently: Take  20 mEq by mouth every other day.)   zolpidem (AMBIEN) 5 MG tablet Take 1 tablet by mouth at bedtime as needed   [DISCONTINUED] cefdinir (OMNICEF) 300 MG capsule Take 1 capsule by mouth 2 times daily for 7 days   [DISCONTINUED] hydrochlorothiazide (HYDRODIURIL) 25 MG tablet Take 1 tablet (25 mg total) by mouth daily.   [DISCONTINUED] methylPREDNISolone (MEDROL DOSEPAK) 4 MG TBPK tablet Follow package instructions   [DISCONTINUED] potassium chloride SA (KLOR-CON) 20 MEQ tablet Take 1 tablet (20 mEq total) by mouth daily.   fluticasone (FLONASE) 50 MCG/ACT nasal spray Place 2 sprays into both nostrils daily.   phentermine (ADIPEX-P) 37.5 MG tablet TAKE 1 TABLET (37.5 MG TOTAL) BY MOUTH DAILY BEFORE BREAKFAST.   phentermine (ADIPEX-P) 37.5 MG tablet TAKE 1 TABLET BY MOUTH EVERY MORNING   zolpidem (AMBIEN) 5 MG tablet TAKE 1 TABLET BY MOUTH AT BEDTIME   zolpidem (AMBIEN) 5 MG tablet TAKE 1 TABLET BY MOUTH AT BEDTIME   No facility-administered encounter medications on file as of 06/22/2021.    Allergies as of 06/22/2021 - Review Complete 06/22/2021  Allergen Reaction Noted   Amlodipine Other (See Comments) 10/08/2020    Past Medical History:  Diagnosis Date   Arthritis    Chicken pox    GERD (gastroesophageal reflux disease)    Hypertension    Liver lesion    Hepatic Adenoma   Migraines     Past Surgical History:  Procedure Laterality Date   KNEE ARTHROSCOPY Right  01/2017   NASAL SINUS SURGERY  02/2019    Family History  Problem Relation Age of Onset   Cancer - Lung Father    Diabetes Mother    Hyperlipidemia Mother    Cancer Maternal Grandmother    Diabetes Maternal Grandmother    Hyperlipidemia Maternal Grandmother    Hypertension Maternal Grandmother    Diabetes Maternal Grandfather    Hyperlipidemia Maternal Grandfather    Cancer Paternal Grandmother    Diabetes Paternal Grandmother    Heart disease Paternal Grandmother    Hyperlipidemia Paternal Grandmother     Hypertension Paternal Grandmother    Diabetes Sister    Hyperlipidemia Sister    Colon cancer Neg Hx    Colon polyps Neg Hx    Rectal cancer Neg Hx    Stomach cancer Neg Hx    Breast cancer Neg Hx     Social History   Socioeconomic History   Marital status: Married    Spouse name: Not on file   Number of children: Not on file   Years of education: Not on file   Highest education level: Not on file  Occupational History   Occupation: Research scientist (life sciences): [employer]  Tobacco Use   Smoking status: Never   Smokeless tobacco: Never  Substance and Sexual Activity   Alcohol use: Yes    Alcohol/week: 0.0 standard drinks    Comment: occas   Drug use: No   Sexual activity: Never  Other Topics Concern   Not on file  Social History Narrative   Not on file   Social Determinants of Health   Financial Resource Strain: Not on file  Food Insecurity: Not on file  Transportation Needs: Not on file  Physical Activity: Not on file  Stress: Not on file  Social Connections: Not on file  Intimate Partner Violence: Not on file    Review of systems: Review of Systems  Constitutional: Negative for fever and chills.  HENT: Negative.   Eyes: Negative for blurred vision.  Respiratory: as per HPI  Cardiovascular: Negative for chest pain and palpitations.  Gastrointestinal: Negative for vomiting, diarrhea, blood per rectum. Genitourinary: Negative for dysuria, urgency, frequency and hematuria.  Musculoskeletal: Negative for myalgias, back pain and joint pain.  Skin: Negative for itching and rash.  Neurological: Negative for dizziness, tremors, focal weakness, seizures and loss of consciousness.  Endo/Heme/Allergies: Negative for environmental allergies.  Psychiatric/Behavioral: Negative for depression, suicidal ideas and hallucinations.  All other systems reviewed and are negative.  Physical Exam: Blood pressure 122/62, pulse 95, temperature 97.7 F (36.5 C),  temperature source Oral, height 5\' 4"  (1.626 m), weight 204 lb 6.4 oz (92.7 kg), SpO2 97 %. Gen:      No acute distress HEENT:  EOMI, sclera anicteric Neck:     No masses; no thyromegaly Lungs:    Clear to auscultation bilaterally; normal respiratory effort CV:         Regular rate and rhythm; no murmurs Abd:      + bowel sounds; soft, non-tender; no palpable masses, no distension Ext:    No edema; adequate peripheral perfusion Skin:      Warm and dry; no rash Neuro: alert and oriented x 3 Psych: normal mood and affect  Data Reviewed: Imaging: CTA 09/28/2020-no pulmonary embolism, no acute lung abnormality.  Small hiatal hernia I have reviewed the images personally Chest x-ray report from primary care dated 05/11/2021-no acute abnormality  PFTs:  Labs: Labs from primary care CBC  05/11/2021-WBC 7.59, eos 2.2%, absolute eosinophil count CMP 05/11/2021-significant for AST 48, ALT 79  Assessment:  Chronic cough She has been treated adequately for postnasal drip, acid reflux but continues to have symptoms.  Lisinopril was held for 10 days but had to be reinstated due to intolerance of alternate med olmesartan.  I am not sure if 10 days of ACE inhibitor holiday is adequate to see improvement and may need to be readdressed again later.  We will evaluate for asthma, reactive airway disease with PFTs Trial of Symbicort 160.  Consider high-resolution CT chest if she continues to have cough  I educated her on behavioral changes to deal with cough including conscious suppression of the urge to cough, use of throat lozenges.   Plan/Recommendations: Start Symbicort 160 Schedule PFTs  Marshell Garfinkel MD [facility] 06/22/2021, 3:20 PM  CC: Michael Boston, MD

## 2021-06-22 NOTE — Patient Instructions (Signed)
We will start you on an inhaler called Symbicort 160.  Use 2 puffs twice daily Schedule PFTs for evaluation of your lung  You need to try to suppress your cough to allow your larynx (voice box) to heal.  For three days don't talk, laugh, sing, or clear your throat. Do everything you can to suppress the cough during this time. Use hard candies (sugarless Jolly Ranchers) or non-mint or non-menthol containing cough drops during this time to soothe your throat.  Use a cough suppressant (Delsym or what I have prescribed you) around the clock during this time.  After three days, gradually increase the use of your voice and back off on the cough suppressants.   Follow-up in 2 to 3 months.

## 2021-06-23 ENCOUNTER — Ambulatory Visit: Payer: BC Managed Care – PPO | Admitting: Pulmonary Disease

## 2021-06-23 DIAGNOSIS — R053 Chronic cough: Secondary | ICD-10-CM

## 2021-06-23 LAB — PULMONARY FUNCTION TEST
DL/VA % pred: 100 %
DL/VA: 4.26 ml/min/mmHg/L
DLCO cor % pred: 103 %
DLCO cor: 21.22 ml/min/mmHg
DLCO unc % pred: 103 %
DLCO unc: 21.22 ml/min/mmHg
FEF 25-75 Post: 3.16 L/sec
FEF 25-75 Pre: 3.18 L/sec
FEF2575-%Change-Post: 0 %
FEF2575-%Pred-Post: 127 %
FEF2575-%Pred-Pre: 128 %
FEV1-%Change-Post: 0 %
FEV1-%Pred-Post: 107 %
FEV1-%Pred-Pre: 106 %
FEV1-Post: 2.84 L
FEV1-Pre: 2.81 L
FEV1FVC-%Change-Post: 0 %
FEV1FVC-%Pred-Pre: 105 %
FEV6-%Change-Post: 1 %
FEV6-%Pred-Post: 104 %
FEV6-%Pred-Pre: 103 %
FEV6-Post: 3.43 L
FEV6-Pre: 3.38 L
FEV6FVC-%Pred-Post: 103 %
FEV6FVC-%Pred-Pre: 103 %
FVC-%Change-Post: 1 %
FVC-%Pred-Post: 101 %
FVC-%Pred-Pre: 99 %
FVC-Post: 3.43 L
FVC-Pre: 3.38 L
Post FEV1/FVC ratio: 83 %
Post FEV6/FVC ratio: 100 %
Pre FEV1/FVC ratio: 83 %
Pre FEV6/FVC Ratio: 100 %
RV % pred: 105 %
RV: 2.02 L
TLC % pred: 104 %
TLC: 5.27 L

## 2021-06-23 NOTE — Progress Notes (Signed)
PFT done today. 

## 2021-06-28 ENCOUNTER — Other Ambulatory Visit (HOSPITAL_COMMUNITY): Payer: Self-pay

## 2021-06-28 MED ORDER — ZOLPIDEM TARTRATE 5 MG PO TABS
ORAL_TABLET | ORAL | 3 refills | Status: DC
  Filled 2021-06-28: qty 30, 30d supply, fill #0
  Filled 2021-07-29: qty 30, 30d supply, fill #1
  Filled 2021-09-01: qty 30, 30d supply, fill #2
  Filled 2021-10-27: qty 30, 30d supply, fill #3

## 2021-07-01 DIAGNOSIS — Z6835 Body mass index (BMI) 35.0-35.9, adult: Secondary | ICD-10-CM | POA: Diagnosis not present

## 2021-07-01 DIAGNOSIS — Z01419 Encounter for gynecological examination (general) (routine) without abnormal findings: Secondary | ICD-10-CM | POA: Diagnosis not present

## 2021-07-09 ENCOUNTER — Encounter: Payer: Self-pay | Admitting: Pulmonary Disease

## 2021-07-21 NOTE — Telephone Encounter (Signed)
Lung function tests are all normal with no abnormality noted.  I will discuss further at time of return clinic visit ?

## 2021-07-29 ENCOUNTER — Other Ambulatory Visit (HOSPITAL_COMMUNITY): Payer: Self-pay

## 2021-08-02 ENCOUNTER — Other Ambulatory Visit (HOSPITAL_COMMUNITY): Payer: Self-pay

## 2021-08-02 MED ORDER — AMOXICILLIN 500 MG PO CAPS
ORAL_CAPSULE | ORAL | 0 refills | Status: DC
  Filled 2021-08-02: qty 8, 2d supply, fill #0

## 2021-08-16 ENCOUNTER — Other Ambulatory Visit (HOSPITAL_COMMUNITY): Payer: Self-pay

## 2021-08-24 ENCOUNTER — Ambulatory Visit: Payer: BC Managed Care – PPO | Admitting: Pulmonary Disease

## 2021-08-24 VITALS — BP 116/60 | HR 85 | Temp 98.3°F | Ht 64.0 in | Wt 202.0 lb

## 2021-08-24 DIAGNOSIS — R053 Chronic cough: Secondary | ICD-10-CM | POA: Diagnosis not present

## 2021-08-24 NOTE — Progress Notes (Signed)
? ?      ?[name] [name]    [id number]    [birth year] ? ?Primary Care Physician:Wile, Jesse Sans, MD ? ?Referring Physician: Michael Boston, MD ?[address] ?[place],  [place] [address] ? ?Chief complaint: Follow-up for chronic cough ? ?HPI: ?56 year old with history of hypertension, GERD ?Complains of chronic cough since November 2023.  Cough is usually nonproductive but has occasional mucus.  Denies any dyspnea ?She is treated for seasonal allergies, postnasal drip with Flonase.  Zyrtec made her sleepy.  She has tried chlorpheniramine twice daily recently with no improvement in cough. ? ?Has history GERD with symptoms of acid reflux on laying down at night.  Pepcid was stopped and she was started on Protonix for better management.  She states that symptoms of reflux have improved.  She was on lisinopril which was held for 10 days and started on olmesartan.  However she needed to go back on lisinopril as she had swelling of the feet with no improvement in cough. ? ?History notable for OSA diagnosed around 2007.  She was on CPAP briefly but stopped as she could not tolerate the mask ? ?Pets: Dog ?Occupation: Optometrist ?Exposures: No mold, hot tub, Jacuzzi.  No feather pillows or comforters ?Smoking history: Never smoker ?Travel history: No significant travel history ?Relevant family history: No family history of lung disease ? ?Interim history: ?She is here for review of PFTs.  States that cough continues without change ?She had tried Symbicort without any improvement ? ?Outpatient Encounter Medications as of 08/24/2021  ?Medication Sig  ? amoxicillin (AMOXIL) 500 MG capsule Take 4 capsules by mouth 1 hour prior to dental appointment (Patient not taking: Reported on 08/24/2021)  ? budesonide-formoterol (SYMBICORT) 160-4.5 MCG/ACT inhaler Inhale 2 puffs into the lungs in the morning and at bedtime. (Patient not taking: Reported on 08/24/2021)  ? hydrochlorothiazide (HYDRODIURIL) 25 MG tablet Take 1 tablet (25 mg total) by  mouth daily.  ? lisinopril (ZESTRIL) 10 MG tablet Take 1 tablet by mouth once a day.  ? olmesartan (BENICAR) 5 MG tablet Take 1 tablet by mouth once a day for blood pressure  ? pantoprazole (PROTONIX) 40 MG tablet Take 1 tablet by mouth once a day  ? potassium chloride SA (KLOR-CON) 20 MEQ tablet Take 1 tablet (20 mEq total) by mouth daily. (Patient taking differently: Take 20 mEq by mouth every other day.)  ? [DISCONTINUED] fluticasone (FLONASE) 50 MCG/ACT nasal spray Place 2 sprays into both nostrils daily.  ? [DISCONTINUED] phentermine (ADIPEX-P) 37.5 MG tablet TAKE 1 TABLET (37.5 MG TOTAL) BY MOUTH DAILY BEFORE BREAKFAST.  ? [DISCONTINUED] phentermine (ADIPEX-P) 37.5 MG tablet TAKE 1 TABLET BY MOUTH EVERY MORNING  ? [DISCONTINUED] phentermine (ADIPEX-P) 37.5 MG tablet Take 1 tablet by mouth every morning.  ? [DISCONTINUED] zolpidem (AMBIEN) 5 MG tablet TAKE 1 TABLET BY MOUTH AT BEDTIME  ? [DISCONTINUED] zolpidem (AMBIEN) 5 MG tablet TAKE 1 TABLET BY MOUTH AT BEDTIME  ? [DISCONTINUED] zolpidem (AMBIEN) 5 MG tablet Take 1 tablet by mouth every night at bedtime.  ? ?No facility-administered encounter medications on file as of 08/24/2021.  ? ? ?Physical Exam: ?Blood pressure 116/60, pulse 85, temperature 98.3 ?F (36.8 ?C), temperature source Oral, height '5\' 4"'$  (1.626 m), weight 202 lb (91.6 kg), SpO2 97 %. ?Gen:      No acute distress ?HEENT:  EOMI, sclera anicteric ?Neck:     No masses; no thyromegaly ?Lungs:    Clear to auscultation bilaterally; normal respiratory effort ?CV:  Regular rate and rhythm; no murmurs ?Abd:      + bowel sounds; soft, non-tender; no palpable masses, no distension ?Ext:    No edema; adequate peripheral perfusion ?Skin:      Warm and dry; no rash ?Neuro: alert and oriented x 3 ?Psych: normal mood and affect  ? ?Data Reviewed: ?Imaging: ?CTA 09/28/2020-no pulmonary embolism, no acute lung abnormality.  Small hiatal hernia ?I have reviewed the images personally ?Chest x-ray report from  primary care dated 05/11/2021-no acute abnormality ? ?PFTs: ?06/23/2021 ?FVC 3.43 [101%], FEV1 2.84 [107%], F/F 83, TLC 5.27 [104%], DLCO 21.22 [103%] ?Normal test ? ?Labs: ?Labs from primary care ?CBC 05/11/2021-WBC 7.59, eos 2.2%, absolute eosinophil count ?CMP 05/11/2021-significant for AST 48, ALT 79 ? ?Assessment:  ?Chronic cough ?She has been treated adequately for postnasal drip, acid reflux but continues to have symptoms.  Lisinopril was held for 10 days but had to be reinstated due to intolerance of alternate med olmesartan.  I am not sure if 10 days of ACE inhibitor holiday is adequate to see improvement and may need to be readdressed again later. ? ?PFTs are normal and she has not responded to Symbicort so we will stop this.  Suspicion for asthma is low ?I believe we need to do another trial of lisinopril medication and give adequate time to assess response.  She will discuss with her primary care as alternate hypertension medications are difficult for her.  She has had leg swelling with ARB, Norvasc in the past ? ?I educated her on behavioral changes to deal with cough including conscious suppression of the urge to cough, use of throat lozenges.  ? ?GERD ?Continue PPI. ?Discussed that she can use the PPI at night and raise the head of the bed. ? ?Plan/Recommendations: ?Discontinue Symbicort ?Attempt another holiday off lisinopril.  Primary care will coordinate ?Continue PPI ? ?Marshell Garfinkel MD ?[facility] ?08/24/2021, 2:45 PM ? ?CC: Wile, Jesse Sans, MD ? ?  ?

## 2021-08-24 NOTE — Patient Instructions (Signed)
I reviewed your PFTs which show normal lung function ?Please check in with your primary care to see if he can come off the lisinopril ?Follow-up in 6 months with video visit ?

## 2021-08-25 DIAGNOSIS — H04123 Dry eye syndrome of bilateral lacrimal glands: Secondary | ICD-10-CM | POA: Diagnosis not present

## 2021-08-31 ENCOUNTER — Other Ambulatory Visit (HOSPITAL_COMMUNITY): Payer: Self-pay

## 2021-09-01 ENCOUNTER — Other Ambulatory Visit (HOSPITAL_COMMUNITY): Payer: Self-pay

## 2021-09-03 ENCOUNTER — Other Ambulatory Visit (HOSPITAL_COMMUNITY): Payer: Self-pay

## 2021-09-03 MED ORDER — LISINOPRIL 10 MG PO TABS
ORAL_TABLET | ORAL | 3 refills | Status: DC
  Filled 2021-09-03: qty 90, 90d supply, fill #0

## 2021-09-03 MED ORDER — HYDROCHLOROTHIAZIDE 25 MG PO TABS
ORAL_TABLET | ORAL | 2 refills | Status: DC
  Filled 2021-09-03: qty 90, 90d supply, fill #0
  Filled 2022-02-07: qty 90, 90d supply, fill #1
  Filled 2022-04-29: qty 90, 90d supply, fill #2

## 2021-09-03 MED ORDER — POTASSIUM CHLORIDE CRYS ER 20 MEQ PO TBCR
EXTENDED_RELEASE_TABLET | ORAL | 0 refills | Status: DC
Start: 2021-09-03 — End: 2022-06-08
  Filled 2021-09-03: qty 90, 90d supply, fill #0

## 2021-09-15 ENCOUNTER — Other Ambulatory Visit (HOSPITAL_COMMUNITY): Payer: Self-pay

## 2021-09-15 MED ORDER — PHENTERMINE HCL 37.5 MG PO TABS
37.5000 mg | ORAL_TABLET | Freq: Every morning | ORAL | 2 refills | Status: DC
  Filled 2021-09-15: qty 30, 30d supply, fill #0
  Filled 2021-11-04: qty 30, 30d supply, fill #1
  Filled 2022-02-07: qty 30, 30d supply, fill #2

## 2021-10-06 ENCOUNTER — Other Ambulatory Visit (HOSPITAL_COMMUNITY): Payer: Self-pay

## 2021-10-06 MED ORDER — LAGEVRIO 200 MG PO CAPS
ORAL_CAPSULE | ORAL | 0 refills | Status: DC
  Filled 2021-10-06: qty 40, 5d supply, fill #0

## 2021-10-27 ENCOUNTER — Other Ambulatory Visit (HOSPITAL_COMMUNITY): Payer: Self-pay

## 2021-11-01 ENCOUNTER — Other Ambulatory Visit (HOSPITAL_COMMUNITY): Payer: Self-pay

## 2021-11-01 MED ORDER — AZITHROMYCIN 250 MG PO TABS
ORAL_TABLET | ORAL | 0 refills | Status: DC
  Filled 2021-11-01: qty 6, 5d supply, fill #0

## 2021-11-05 ENCOUNTER — Other Ambulatory Visit (HOSPITAL_COMMUNITY): Payer: Self-pay

## 2021-11-25 ENCOUNTER — Other Ambulatory Visit (HOSPITAL_COMMUNITY): Payer: Self-pay

## 2021-11-25 MED ORDER — AMOXICILLIN-POT CLAVULANATE 875-125 MG PO TABS
1.0000 | ORAL_TABLET | Freq: Two times a day (BID) | ORAL | 0 refills | Status: DC
  Filled 2021-11-25: qty 10, 5d supply, fill #0

## 2021-11-26 ENCOUNTER — Other Ambulatory Visit (HOSPITAL_COMMUNITY): Payer: Self-pay

## 2021-11-26 MED ORDER — ZOLPIDEM TARTRATE 5 MG PO TABS
5.0000 mg | ORAL_TABLET | Freq: Every evening | ORAL | 3 refills | Status: DC
  Filled 2021-11-26: qty 30, 30d supply, fill #0
  Filled 2021-12-30: qty 30, 30d supply, fill #1
  Filled 2022-02-01: qty 30, 30d supply, fill #2
  Filled 2022-03-03: qty 30, 30d supply, fill #3

## 2021-12-08 ENCOUNTER — Other Ambulatory Visit (HOSPITAL_COMMUNITY): Payer: Self-pay

## 2021-12-08 DIAGNOSIS — Z713 Dietary counseling and surveillance: Secondary | ICD-10-CM | POA: Diagnosis not present

## 2021-12-08 DIAGNOSIS — R7303 Prediabetes: Secondary | ICD-10-CM | POA: Diagnosis not present

## 2021-12-08 DIAGNOSIS — I10 Essential (primary) hypertension: Secondary | ICD-10-CM | POA: Diagnosis not present

## 2021-12-08 MED ORDER — ATENOLOL 25 MG PO TABS
25.0000 mg | ORAL_TABLET | Freq: Every day | ORAL | 0 refills | Status: DC
Start: 2021-12-08 — End: 2022-01-06
  Filled 2021-12-08: qty 30, 30d supply, fill #0

## 2021-12-16 ENCOUNTER — Other Ambulatory Visit: Payer: Self-pay | Admitting: Family Medicine

## 2021-12-30 ENCOUNTER — Other Ambulatory Visit (HOSPITAL_COMMUNITY): Payer: Self-pay

## 2022-01-06 ENCOUNTER — Other Ambulatory Visit (HOSPITAL_COMMUNITY): Payer: Self-pay

## 2022-01-06 MED ORDER — ATENOLOL 25 MG PO TABS
25.0000 mg | ORAL_TABLET | Freq: Every day | ORAL | 1 refills | Status: DC
  Filled 2022-01-06: qty 90, 90d supply, fill #0
  Filled 2022-03-31: qty 90, 90d supply, fill #1

## 2022-01-07 ENCOUNTER — Other Ambulatory Visit (HOSPITAL_COMMUNITY): Payer: Self-pay

## 2022-01-07 MED ORDER — ATENOLOL 25 MG PO TABS
25.0000 mg | ORAL_TABLET | Freq: Every day | ORAL | 3 refills | Status: DC
  Filled 2022-01-07 – 2022-07-04 (×2): qty 90, 90d supply, fill #0

## 2022-02-01 ENCOUNTER — Other Ambulatory Visit (HOSPITAL_COMMUNITY): Payer: Self-pay

## 2022-02-09 ENCOUNTER — Other Ambulatory Visit (HOSPITAL_COMMUNITY): Payer: Self-pay

## 2022-03-04 ENCOUNTER — Other Ambulatory Visit (HOSPITAL_COMMUNITY): Payer: Self-pay

## 2022-03-31 ENCOUNTER — Other Ambulatory Visit (HOSPITAL_COMMUNITY): Payer: Self-pay

## 2022-04-01 ENCOUNTER — Other Ambulatory Visit (HOSPITAL_COMMUNITY): Payer: Self-pay

## 2022-04-01 MED ORDER — ZOLPIDEM TARTRATE 5 MG PO TABS
5.0000 mg | ORAL_TABLET | Freq: Every evening | ORAL | 3 refills | Status: DC
  Filled 2022-04-02: qty 30, 30d supply, fill #0
  Filled 2022-05-02: qty 30, 30d supply, fill #1
  Filled 2022-05-31: qty 30, 30d supply, fill #2
  Filled 2022-06-27 – 2022-06-29 (×2): qty 30, 30d supply, fill #3

## 2022-04-02 ENCOUNTER — Other Ambulatory Visit (HOSPITAL_COMMUNITY): Payer: Self-pay

## 2022-04-04 ENCOUNTER — Other Ambulatory Visit (HOSPITAL_COMMUNITY): Payer: Self-pay

## 2022-04-27 ENCOUNTER — Ambulatory Visit: Payer: Self-pay

## 2022-04-27 ENCOUNTER — Ambulatory Visit (INDEPENDENT_AMBULATORY_CARE_PROVIDER_SITE_OTHER): Payer: BC Managed Care – PPO

## 2022-04-27 ENCOUNTER — Ambulatory Visit: Payer: BC Managed Care – PPO

## 2022-04-27 ENCOUNTER — Ambulatory Visit (INDEPENDENT_AMBULATORY_CARE_PROVIDER_SITE_OTHER): Payer: BC Managed Care – PPO | Admitting: Family Medicine

## 2022-04-27 ENCOUNTER — Other Ambulatory Visit (HOSPITAL_COMMUNITY): Payer: Self-pay

## 2022-04-27 VITALS — BP 138/86 | HR 69 | Ht 64.0 in | Wt 200.0 lb

## 2022-04-27 DIAGNOSIS — M79671 Pain in right foot: Secondary | ICD-10-CM | POA: Diagnosis not present

## 2022-04-27 MED ORDER — GABAPENTIN 100 MG PO CAPS
100.0000 mg | ORAL_CAPSULE | Freq: Every evening | ORAL | 3 refills | Status: DC | PRN
  Filled 2022-04-27: qty 90, 30d supply, fill #0
  Filled 2022-05-31: qty 90, 30d supply, fill #1

## 2022-04-27 NOTE — Progress Notes (Signed)
I, [name] [name], LAT, ATC acting as a scribe for [name] Leader, MD.  Subjective:    CC: R foot pain  HPI: Pt is a 57 y/o female c/o R foot pain ongoing since the summer. No MOI/trauma. Pt locates pain to the lateral aspect of the R foot and the MT. Pt notes experiencing pain if she turns her foot wrong, is TTP, and pressure from shoe.  She notes the pain is especially bothersome when she tries to sleep at night.  R foot swelling: yes- slight Aggravates: TTP, prolonged standing-esp on hard surfaces, walking Treatments tried: IBU, Tylenol, Salon pas patches  Pertinent review of Systems: No fevers or chills  Relevant historical information: Hypertension   Objective:    Vitals:   04/27/22 1451  BP: 138/86  Pulse: 69  SpO2: 94%   General: Well Developed, well nourished, and in no acute distress.   MSK: Right foot normal appearing Mildly tender palpation of proximal fifth metatarsal.  Normal foot and ankle motion.  Some pain is present with resisted foot eversion.  Pulses cap refill and sensation are intact distally.  Lab and Radiology Results  Diagnostic Limited MSK Ultrasound of: Right foot lateral Proximal fifth metatarsal bony cortex intact.  Slight area of hyperechoic change superficial to the proximal fifth metatarsal could represent avulsion or calcification. Area of increased vascular activity seen on Doppler is present in this area as well. The peroneal tendon is normal-appearing Impression: Possible tendinitis versus soft tissue irritation overlying the bony cortex.  X-ray images right foot obtained today personally and independently interpreted Normal-appearing proximal fifth metatarsal.  Mild midfoot DJD is present. Await formal radiology review   Impression and Recommendations:    Assessment and Plan: 57 y.o. female with right lateral foot pain predominantly located at the proximal fifth metatarsal.  Thought to be peroneal tendinitis or bony irritation at  the insertion site of the peroneal tendon or pain due to excessive pressure in this region.  Plan for physical therapy targeting the peroneal tendon and trial of Voltaren gel.  Also recommend gabapentin as needed at bedtime.  Check back in 6 weeks.  PDMP not reviewed this encounter. Orders Placed This Encounter  Procedures   [place] LIMITED JOINT SPACE STRUCTURES LOW RIGHT(NO LINKED CHARGES)    Order Specific Question:   Reason for Exam (SYMPTOM  OR DIAGNOSIS REQUIRED)    Answer:   right foot pain    Order Specific Question:   Preferred imaging location?    Answer:   [facility]   DG Foot Complete Right    Standing Status:   Future    Number of Occurrences:   1    Standing Expiration Date:   04/28/2023    Order Specific Question:   Reason for Exam (SYMPTOM  OR DIAGNOSIS REQUIRED)    Answer:   right foot pain    Order Specific Question:   Preferred imaging location?    Answer:   [facility]    Order Specific Question:   Is patient pregnant?    Answer:   No   Ambulatory referral to Physical Therapy    Referral Priority:   Routine    Referral Type:   Physical Medicine    Referral Reason:   Specialty Services Required    Requested Specialty:   Physical Therapy    Number of Visits Requested:   1   Meds ordered this encounter  Medications   gabapentin (NEURONTIN) 100 MG capsule  Sig: Take 1-3 capsules (100-300 mg total) by mouth at bedtime as needed (nerve pain).    Dispense:  90 capsule    Refill:  3    Discussed warning signs or symptoms. Please see discharge instructions. Patient expresses understanding.   The above documentation has been reviewed and is accurate and complete [name] [name], M.D.

## 2022-04-27 NOTE — Patient Instructions (Addendum)
Thank you for coming in today.   Please get an Xray today before you leave   I've referred you to Physical Therapy.  Let us know if you don't hear from them in one week.   Please use Voltaren gel (Generic Diclofenac Gel) up to 4x daily for pain as needed.  This is available over-the-counter as both the name brand Voltaren gel and the generic diclofenac gel.   Use the gabapentin as needed.   Recheck in about 6 weeks.   Keep me updated.

## 2022-04-29 NOTE — Progress Notes (Signed)
Right foot x-ray shows a little bit of arthritis changes.

## 2022-05-02 ENCOUNTER — Encounter (HOSPITAL_COMMUNITY): Payer: Self-pay

## 2022-05-02 ENCOUNTER — Other Ambulatory Visit: Payer: Self-pay

## 2022-05-02 ENCOUNTER — Other Ambulatory Visit (HOSPITAL_COMMUNITY): Payer: Self-pay

## 2022-05-03 ENCOUNTER — Other Ambulatory Visit (HOSPITAL_COMMUNITY): Payer: Self-pay

## 2022-06-01 ENCOUNTER — Other Ambulatory Visit: Payer: Self-pay | Admitting: Obstetrics & Gynecology

## 2022-06-01 DIAGNOSIS — Z1231 Encounter for screening mammogram for malignant neoplasm of breast: Secondary | ICD-10-CM

## 2022-06-02 ENCOUNTER — Other Ambulatory Visit: Payer: Self-pay

## 2022-06-02 NOTE — Therapy (Unsigned)
OUTPATIENT PHYSICAL THERAPY LOWER EXTREMITY EVALUATION   Patient Name: [name] [name] MRN: [id number] DOB:1965/10/23, 57 y.o., female Today's Date: 06/06/2022  END OF SESSION:  PT End of Session - 06/06/22 1520     Visit Number 1    Number of Visits 10    Date for PT Re-Evaluation 08/15/22    PT Start Time 1520    PT Stop Time [id number]    PT Time Calculation (min) 44 min    Activity Tolerance Patient tolerated treatment well             Past Medical History:  Diagnosis Date   Arthritis    Chicken pox    GERD (gastroesophageal reflux disease)    Hypertension    Liver lesion    Hepatic Adenoma   Migraines    Past Surgical History:  Procedure Laterality Date   KNEE ARTHROSCOPY Right 01/2017   NASAL SINUS SURGERY  02/2019   Patient Active Problem List   Diagnosis Date Noted   Fatty liver 07/17/2019   Migraines 04/14/2018   Vitamin D deficiency 04/14/2018   Primary insomnia 04/14/2018   GERD (gastroesophageal reflux disease)    Menopausal syndrome 04/13/2018   Chronic pansinusitis 07/06/2016   Tinnitus of right ear 07/06/2016   Hypertension 01/23/2016   OSA (obstructive sleep apnea) 06/22/2007   Hepatic adenoma 04/10/2007    PCP: Michael Boston, MD  REFERRING PROVIDER: Gregor Hams, MD  REFERRING DIAG: [phone] (ICD-10-CM) - Right foot pain  THERAPY DIAG:  Pain in right foot  Muscle weakness (generalized)  Chronic left shoulder pain  Neck pain  Rationale for Evaluation and Treatment: Rehabilitation  ONSET DATE: Summer 2023  SUBJECTIVE:   SUBJECTIVE STATEMENT: States that her foot will hurt  on the side of her right foot and has been swelling the last month. States it hurts at rest and to the touch. States that prolonged standing may bother it. States she has knee arthritis and swelling and it feels heavy. States that she keeps her right knee taped with KT tape . States compression garment/sleeve bothers her knee. Pain in knee is worse in the winter.    States she has compression garments but doesn't like them. States she had lots of swelling in her knee prior to her knee replacement.   PERTINENT HISTORY: Migraines, right knee scope 6 years, right partial medial compartment knee replacement 2020, left hip pain  PAIN:  Are you having pain? Yes: NPRS scale: 5/10 Pain location: right foot at 5th base of met Pain description: throbbing Aggravating factors: light touch, at night the touching the side of the bed Relieving factors: not touching it   PRECAUTIONS: None  WEIGHT BEARING RESTRICTIONS: No  FALLS:  Has patient fallen in last 6 months? No   OCCUPATION: accountant   PLOF: Independent  PATIENT GOALS: to have less pain    OBJECTIVE:   DIAGNOSTIC FINDINGS:  Right foot xray 04/27/22 FINDINGS: Normal alignment. No acute fracture. Degenerative changes of the midfoot along the TMT joints. Small calcaneal enthesophyte of the plantar fascia. Soft tissues are unremarkable.   IMPRESSION: Scattered degenerative changes as described. No acute osseous abnormality.  PATIENT SURVEYS:  FOTO next session  COGNITION: Overall cognitive status: Within functional limits for tasks assessed     SENSATION: Hypersensitivity to touch along right lateral side of foot  EDEMA:  Calf L- 16.75 inches, R 17.25 inches Thighs L -23.0 inches 24.0 inches  R Pitting edema 3 on right and 2 on left  POSTURE: rounded shoulders and flexed trunk   PALPATION: Tenderness at right %th base of met     LE Measurements Lower Extremity Right 06/06/2022 Left 06/06/2022   A/PROM MMT A/PROM MMT  Hip Flexion  4  4  Hip Extension      Hip Abduction      Hip Adduction      Hip Internal rotation      Hip External rotation      Knee Flexion  4  4  Knee Extension  4  4+  Ankle Dorsiflexion      Ankle Plantarflexion      Ankle Inversion 30 4+ 30 4+  Ankle Eversion 5 4+ 5 4+   (Blank rows = not tested) * pain   LOWER EXTREMITY SPECIAL  TESTS:  SLS 15 seconds B    TODAY'S TREATMENT:                                                                                                                              DATE:    How to manage swelling with compression elevation and ankle pumps  PATIENT EDUCATION:  Education details: on current presentation, on HEP, on clinical outcomes score and POC, on how to manage swelling Person educated: Patient Education method: Consulting civil engineer, Demonstration, and Handouts Education comprehension: verbalized understanding   HOME EXERCISE PROGRAM: None at this time f/u with swelling  ASSESSMENT:  CLINICAL IMPRESSION: Patient presents to physical therapy with complaints of right foot pain left hip pain shoulder neck pain as well as right knee pain.  Session focused on lower extremities on this date but will assess shoulder in future sessions.  Pain in right foot presents with palpation but not with manual muscle testing, range of motion more single-leg stance.  OBJECTIVE IMPAIRMENTS: Abnormal gait, decreased activity tolerance, decreased balance, decreased endurance, decreased mobility, difficulty walking, decreased ROM, decreased strength, postural dysfunction, and pain.   ACTIVITY LIMITATIONS: sitting, standing, sleeping, and locomotion level  PARTICIPATION LIMITATIONS: community activity and occupation  PERSONAL FACTORS: Fitness, Past/current experiences, and Profession are also affecting patient's functional outcome.   REHAB POTENTIAL: Good  CLINICAL DECISION MAKING: Stable/uncomplicated  EVALUATION COMPLEXITY: Low   GOALS: Goals reviewed with patient? yes  SHORT TERM GOALS: Target date: 07/11/2022  Patient will be independent in self management strategies to improve quality of life and functional outcomes. Baseline: New Program Goal status: INITIAL  2.  Patient will report at least 25% improvement in overall symptoms and/or function to demonstrate improved functional  mobility Baseline: 0% better Goal status: INITIAL  3.  Patient will report no swelling in legs during the day to reduce pain in legs Baseline: current swelling Goal status: INITIAL  4.  Patient will be able to sleep without pain to improve sleep quality. Baseline:  Goal status: INITIAL    LONG TERM GOALS: Target date: 08/15/2022   Patient will report at least 75% improvement in overall symptoms and/or function to demonstrate improved  functional mobility Baseline: 0% better Goal status: INITIAL  2.  Patient will improve score on FOTO outcomes measure to projected score to demonstrate overall improved function and QOL Baseline: see above Goal status: INITIAL  3.  Patient will be able to demonstrate at least 4+/5 MMT in B LE Baseline:  Goal status: INITIAL      PLAN:  PT FREQUENCY: 1-2x/week for total of 10 visits over 10 weeks   PT DURATION: 10 weeks  PLANNED INTERVENTIONS: Therapeutic exercises, Therapeutic activity, Neuromuscular re-education, Balance training, Gait training, Patient/Family education, Self Care, Joint mobilization, Joint manipulation, Stair training, Canalith repositioning, Orthotic/Fit training, DME instructions, Aquatic Therapy, Dry Needling, Electrical stimulation, Cryotherapy, Moist heat, Taping, Traction, Ultrasound, Ionotophoresis 44m/ml Dexamethasone, Manual therapy, and Re-evaluation  PLAN FOR NEXT SESSION: shoulder and neck assessment, f/u on edema, hip/knee/ankle strengthening  5:05 PM, 06/06/22 MJerene Pitch DPT Physical Therapy with [facility]

## 2022-06-06 ENCOUNTER — Encounter: Payer: Self-pay | Admitting: Physical Therapy

## 2022-06-06 ENCOUNTER — Ambulatory Visit: Payer: BC Managed Care – PPO | Admitting: Physical Therapy

## 2022-06-06 DIAGNOSIS — G8929 Other chronic pain: Secondary | ICD-10-CM

## 2022-06-06 DIAGNOSIS — M542 Cervicalgia: Secondary | ICD-10-CM

## 2022-06-06 DIAGNOSIS — M79671 Pain in right foot: Secondary | ICD-10-CM

## 2022-06-06 DIAGNOSIS — M6281 Muscle weakness (generalized): Secondary | ICD-10-CM | POA: Diagnosis not present

## 2022-06-06 DIAGNOSIS — M25512 Pain in left shoulder: Secondary | ICD-10-CM | POA: Diagnosis not present

## 2022-06-08 ENCOUNTER — Encounter: Payer: Self-pay | Admitting: Family Medicine

## 2022-06-08 ENCOUNTER — Ambulatory Visit (INDEPENDENT_AMBULATORY_CARE_PROVIDER_SITE_OTHER): Payer: BC Managed Care – PPO | Admitting: Family Medicine

## 2022-06-08 ENCOUNTER — Ambulatory Visit: Payer: BC Managed Care – PPO | Admitting: Family Medicine

## 2022-06-08 VITALS — BP 124/80 | HR 67 | Temp 98.1°F | Ht 64.0 in | Wt 203.8 lb

## 2022-06-08 DIAGNOSIS — F411 Generalized anxiety disorder: Secondary | ICD-10-CM | POA: Diagnosis not present

## 2022-06-08 DIAGNOSIS — I10 Essential (primary) hypertension: Secondary | ICD-10-CM

## 2022-06-08 MED ORDER — POTASSIUM CHLORIDE CRYS ER 20 MEQ PO TBCR: 20.0000 meq | EXTENDED_RELEASE_TABLET | ORAL | Status: DC

## 2022-06-08 NOTE — Progress Notes (Signed)
Subjective  CC:  Chief Complaint  Patient presents with   Hypertension    HPI: [name] [name] is a 57 y.o. female who presents to the office today to address the problems listed above in the chief complaint. 57 yo who will be reestablishing care here in our office soon presents due to feeling poorly today. Complains of feeling numbness and tingling around mouth, some in hands and tightness in chest. No true dyspnea or chest pain. Just feels off. Worries about her blood pressure and potassium levels. Had problems with hypokalemia in past. Now on hctz 25 and kdur 20 daily. No longer on ace. On atenolol 25 daily as well. No GI sxs. Lots of work stress.  No h/o CAD.   Assessment  1. Essential hypertension   2. Anxiety reaction      Plan  HTN:  reassured. Bp is controlled. Check labs Anxiety: sx complex most c/w anxiety/stress reaction. Pt will monitor. IF develops chest pain, would return for evaluation.   Follow up: as scheduled   06/16/2022  Orders Placed This Encounter  Procedures   Basic metabolic panel   CBC with Differential/Platelet   TSH   Meds ordered this encounter  Medications   potassium chloride SA (KLOR-CON M) 20 MEQ tablet    Sig: Take 1 tablet (20 mEq total) by mouth every other day.      I reviewed the patients updated PMH, FH, and SocHx.    Patient Active Problem List   Diagnosis Date Noted   Fatty liver 07/17/2019   Migraines 04/14/2018   Vitamin D deficiency 04/14/2018   Primary insomnia 04/14/2018   GERD (gastroesophageal reflux disease)    Menopausal syndrome 04/13/2018   Chronic pansinusitis 07/06/2016   Tinnitus of right ear 07/06/2016   Hypertension 01/23/2016   OSA (obstructive sleep apnea) 06/22/2007   Hepatic adenoma 04/10/2007   Current Meds  Medication Sig   atenolol (TENORMIN) 25 MG tablet Take 1 tablet (25 mg total) by mouth daily.   gabapentin (NEURONTIN) 100 MG capsule Take 1-3 capsules (100-300 mg total) by mouth at bedtime as  needed (nerve pain).   hydrochlorothiazide (HYDRODIURIL) 25 MG tablet Take 1 tablet by mouth in the morning   molnupiravir EUA (LAGEVRIO) 200 MG CAPS capsule Take 4 capsules by mouth every 12 hours for 5 days.   zolpidem (AMBIEN) 5 MG tablet Take 1 tablet (5 mg total) by mouth at bedtime as needed.   [DISCONTINUED] atenolol (TENORMIN) 25 MG tablet Take 1 tablet (25 mg total) by mouth daily.   [DISCONTINUED] hydrochlorothiazide (HYDRODIURIL) 25 MG tablet Take 1 tablet (25 mg total) by mouth daily.   [DISCONTINUED] potassium chloride SA (KLOR-CON M) 20 MEQ tablet Take 1 tablet by mouth once daily with food   [DISCONTINUED] potassium chloride SA (KLOR-CON) 20 MEQ tablet Take 1 tablet (20 mEq total) by mouth daily. (Patient taking differently: Take 20 mEq by mouth every other day.)    Allergies: Patient is allergic to lisinopril-hydrochlorothiazide and amlodipine. Family History: Patient family history includes Cancer in her maternal grandmother and paternal grandmother; Cancer - Lung in her father; Diabetes in her maternal grandfather, maternal grandmother, mother, paternal grandmother, and sister; Heart disease in her paternal grandmother; Hyperlipidemia in her maternal grandfather, maternal grandmother, mother, paternal grandmother, and sister; Hypertension in her maternal grandmother and paternal grandmother. Social History:  Patient  reports that she has never smoked. She has never used smokeless tobacco. She reports current alcohol use. She reports that she does  not use drugs.  Review of Systems: Constitutional: Negative for fever malaise or anorexia Cardiovascular: negative for chest pain Respiratory: negative for SOB or persistent cough Gastrointestinal: negative for abdominal pain  Objective  Vitals: BP 124/80   Pulse 67   Temp 98.1 F (36.7 C)   Ht 5' 4"$  (1.626 m)   Wt 203 lb 12.8 oz (92.4 kg)   LMP  (LMP Unknown)   SpO2 97%   BMI 34.98 kg/m  General: no acute distress ,  A&Ox3 Psych: appears anxious HEENT: PEERL, conjunctiva normal, neck is supple Cardiovascular:  RRR without murmur or gallop. No cw ttp Respiratory:  Good breath sounds bilaterally, CTAB with normal respiratory effort,no wheezine Abd is benign No edema Skin:  Warm, no rashes  Commons side effects, risks, benefits, and alternatives for medications and treatment plan prescribed today were discussed, and the patient expressed understanding of the given instructions. Patient is instructed to call or message via MyChart if he/she has any questions or concerns regarding our treatment plan. No barriers to understanding were identified. We discussed Red Flag symptoms and signs in detail. Patient expressed understanding regarding what to do in case of urgent or emergency type symptoms.  Medication list was reconciled, printed and provided to the patient in AVS. Patient instructions and summary information was reviewed with the patient as documented in the AVS. This note was prepared with assistance of Dragon voice recognition software. Occasional wrong-word or sound-a-like substitutions may have occurred due to the inherent limitations of voice recognition software

## 2022-06-08 NOTE — Patient Instructions (Signed)
Please follow up as scheduled for your next visit  I will release your lab results to you on your MyChart account with further instructions. You may see the results before I do, but when I review them I will send you a message with my report or have my assistant call you if things need to be discussed. Please reply to my message with any questions. Thank you!   If you have any questions or concerns, please don't hesitate to send me a message via MyChart or call the office at [phone]. Thank you for visiting with [place] today! It's our pleasure caring for you.   Stress, Adult Stress is a normal reaction to life events. Stress is what you feel when life demands more than you are used to, or more than you think you can handle. Some stress can be useful, such as studying for a test or meeting a deadline at work. Stress that occurs too often or for too long can cause problems. Long-lasting stress is called chronic stress. Chronic stress can affect your emotional health and interfere with relationships and normal daily activities. Too much stress can weaken your body's defense system (immune system) and increase your risk for physical illness. If you already have a medical problem, stress can make it worse. What are the causes? All sorts of life events can cause stress. An event that causes stress for one person may not be stressful for someone else. Major life events, whether positive or negative, commonly cause stress. Examples include: Losing a job or starting a new job. Losing a loved one. Moving to a new town or home. Getting married or divorced. Having a baby. Getting injured or sick. Less obvious life events can also cause stress, especially if they occur day after day or in combination with each other. Examples include: Working long hours. Driving in traffic. Caring for children. Being in debt. Being in a difficult relationship. What are the signs or symptoms? Stress can cause emotional  and physical symptoms and can lead to unhealthy behaviors. These include the following: Emotional symptoms Anxiety. This is feeling worried, afraid, on edge, overwhelmed, or out of control. Anger, including irritation or impatience. Depression. This is feeling sad, down, helpless, or guilty. Trouble focusing, remembering, or making decisions. Physical symptoms Aches and pains. These may affect your head, neck, back, stomach, or other areas of your body. Tight muscles or a clenched jaw. Low energy. Trouble sleeping. Unhealthy behaviors Eating to feel better (overeating) or skipping meals. Working too much or putting off tasks. Smoking, drinking alcohol, or using drugs to feel better. How is this diagnosed? A stress disorder is diagnosed through an assessment by your health care provider. A stress disorder may be diagnosed based on: Your symptoms and any stressful life events. Your medical history. Tests to rule out other causes of your symptoms. Depending on your condition, your health care provider may refer you to a specialist for further evaluation. How is this treated?  Stress management techniques are the recommended treatment for stress. Medicine is not typically recommended for treating stress. Techniques to reduce your reaction to stressful life events include: Identifying stress. Monitor yourself for symptoms of stress and notice what causes stress for you. These skills may help you to avoid or prepare for stressful events. Managing time. Set your priorities, keep a calendar of events, and learn to say no. These actions can help you avoid taking on too much. Techniques for dealing with stress include: Rethinking the problem. Try to  think realistically about stressful events rather than ignoring them or overreacting. Try to find the positives in a stressful situation rather than focusing on the negatives. Exercise. Physical exercise can release both physical and emotional tension.  The key is to find a form of exercise that you enjoy and do it regularly. Relaxation techniques. These relax the body and mind. Find one or more that you enjoy and use the techniques regularly. Examples include: Meditation, deep breathing, or progressive relaxation techniques. Yoga or tai chi. Biofeedback, mindfulness techniques, or journaling. Listening to music, being in nature, or taking part in other hobbies. Practicing a healthy lifestyle. Eat a balanced diet, drink plenty of water, limit or avoid caffeine, and get plenty of sleep. Having a strong support network. Spend time with family, friends, or other people you enjoy being around. Express your feelings and talk things over with someone you trust. Counseling or talk therapy with a mental health provider may help if you are having trouble managing stress by yourself. Follow these instructions at home: Lifestyle  Avoid drugs. Do not use any products that contain nicotine or tobacco. These products include cigarettes, chewing tobacco, and vaping devices, such as e-cigarettes. If you need help quitting, ask your health care provider. If you drink alcohol: Limit how much you have to: 0-1 drink a day for women who are not pregnant. 0-2 drinks a day for men. Know how much alcohol is in a drink. In the [place] one drink equals one 12 oz bottle of beer (355 mL), one 5 oz glass of wine (148 mL), or one 1 oz glass of hard liquor (44 mL). Do not use alcohol or drugs to relax. Eat a balanced diet that includes fresh fruits and vegetables, whole grains, lean meats, fish, eggs, beans, and low-fat dairy. Avoid processed foods and foods high in added fat, sugar, and salt. Exercise at least 30 minutes on 5 or more days each week. Get 7-8 hours of sleep each night. General instructions  Practice stress management techniques as told by your health care provider. Drink enough fluid to keep your urine pale yellow. Take over-the-counter and prescription  medicines only as told by your health care provider. Keep all follow-up visits. This is important. Contact a health care provider if: Your symptoms get worse. You have new symptoms. You feel overwhelmed by your problems and can no longer manage them by yourself. Get help right away if: You have thoughts of hurting yourself or others. Get help right awayif you feel like you may hurt yourself or others, or have thoughts about taking your own life. Go to your nearest emergency room or: Call 911. Call the [place] at (386)407-9048 or 988. This is open 24 hours a day. Text the Crisis Text Line at 409 820 5130. Summary Stress is a normal reaction to life events. It can cause problems if it happens too often or for too long. Practicing stress management techniques is the best way to treat stress. Counseling or talk therapy with a mental health provider may help if you are having trouble managing stress by yourself. This information is not intended to replace advice given to you by your health care provider. Make sure you discuss any questions you have with your health care provider. Document Revised: 11/19/2020 Document Reviewed: 11/19/2020 Elsevier Patient Education  [place].

## 2022-06-09 ENCOUNTER — Other Ambulatory Visit (HOSPITAL_COMMUNITY): Payer: Self-pay

## 2022-06-09 ENCOUNTER — Other Ambulatory Visit: Payer: Self-pay

## 2022-06-09 ENCOUNTER — Telehealth: Payer: Self-pay

## 2022-06-09 DIAGNOSIS — E875 Hyperkalemia: Secondary | ICD-10-CM

## 2022-06-09 LAB — CBC WITH DIFFERENTIAL/PLATELET
Basophils Absolute: 0.1 10*3/uL (ref 0.0–0.1)
Basophils Relative: 1.7 % (ref 0.0–3.0)
Eosinophils Absolute: 0.2 10*3/uL (ref 0.0–0.7)
Eosinophils Relative: 2.5 % (ref 0.0–5.0)
HCT: 40.9 % (ref 36.0–46.0)
Hemoglobin: 13.8 g/dL (ref 12.0–15.0)
Lymphocytes Relative: 33.6 % (ref 12.0–46.0)
Lymphs Abs: 2.5 10*3/uL (ref 0.7–4.0)
MCHC: 33.8 g/dL (ref 30.0–36.0)
MCV: 89.6 fl (ref 78.0–100.0)
Monocytes Absolute: 0.8 10*3/uL (ref 0.1–1.0)
Monocytes Relative: 10.1 % (ref 3.0–12.0)
Neutro Abs: 3.9 10*3/uL (ref 1.4–7.7)
Neutrophils Relative %: 52.1 % (ref 43.0–77.0)
Platelets: 245 10*3/uL (ref 150.0–400.0)
RBC: 4.56 Mil/uL (ref 3.87–5.11)
RDW: 12.8 % (ref 11.5–15.5)
WBC: 7.6 10*3/uL (ref 4.0–10.5)

## 2022-06-09 LAB — BASIC METABOLIC PANEL
BUN: 16 mg/dL (ref 6–23)
CO2: 29 mEq/L (ref 19–32)
Calcium: 9.4 mg/dL (ref 8.4–10.5)
Chloride: 98 mEq/L (ref 96–112)
Creatinine, Ser: 0.84 mg/dL (ref 0.40–1.20)
GFR: 76.77 mL/min (ref >60.00)
Glucose, Bld: 71 mg/dL (ref 70–99)
Potassium: 6.2 mEq/L (ref 3.5–5.1)
Sodium: 137 mEq/L (ref 135–145)

## 2022-06-09 LAB — TSH: TSH: 2.27 u[IU]/mL (ref 0.35–5.50)

## 2022-06-09 MED ORDER — SODIUM POLYSTYRENE SULFONATE 15 GM/60ML PO SUSP
15.0000 g | Freq: Every day | ORAL | 0 refills | Status: AC
  Filled 2022-06-09 – 2022-06-10 (×3): qty 240, 4d supply, fill #0

## 2022-06-09 NOTE — Telephone Encounter (Signed)
I have spoken with pt and she has been scheduled for 9:45 on 06/10/2022

## 2022-06-09 NOTE — Therapy (Signed)
OUTPATIENT PHYSICAL THERAPY TREATMENT NOTE   Patient Name: JENILLE KAHANE MRN: [id number] DOB:12-21-1965, 57 y.o., female Today's Date: 06/13/2022  PCP: Michael Boston, MD   REFERRING PROVIDER: Gregor Hams, MD  END OF SESSION:   PT End of Session - 06/13/22 1307     Visit Number 2    Number of Visits 10    Date for PT Re-Evaluation 08/15/22    PT Start Time [id number]    PT Stop Time [id number]    PT Time Calculation (min) 42 min    Activity Tolerance Patient tolerated treatment well             Past Medical History:  Diagnosis Date   Arthritis    Chicken pox    GERD (gastroesophageal reflux disease)    Hypertension    Liver lesion    Hepatic Adenoma   Migraines    Past Surgical History:  Procedure Laterality Date   KNEE ARTHROSCOPY Right 01/2017   NASAL SINUS SURGERY  02/2019   Patient Active Problem List   Diagnosis Date Noted   Fatty liver 07/17/2019   Migraines 04/14/2018   Vitamin D deficiency 04/14/2018   Primary insomnia 04/14/2018   GERD (gastroesophageal reflux disease)    Menopausal syndrome 04/13/2018   Chronic pansinusitis 07/06/2016   Tinnitus of right ear 07/06/2016   Hypertension 01/23/2016   OSA (obstructive sleep apnea) 06/22/2007   Hepatic adenoma 04/10/2007    THERAPY DIAG:  Pain in right foot  Muscle weakness (generalized)  Chronic left shoulder pain  Neck pain    REFERRING DIAG: M79.671 (ICD-10-CM) - Right foot pain    Rationale for Evaluation and Treatment: Rehabilitation   ONSET DATE: Summer 2023   SUBJECTIVE:    SUBJECTIVE STATEMENT: 06/13/2022 States that she has been wearing her compression stocking and her foot is less swollen but foot is more painful. States that when she takes her compression stocking off she has more pain and instability in her knee. States she was walking and standing a lot this weekend. States she feels like she was limping more this weekend.   States that her foot will hurt  on the side of her right  foot and has been swelling the last month. States it hurts at rest and to the touch. States that prolonged standing may bother it. States she has knee arthritis and swelling and it feels heavy. States that she keeps her right knee taped with KT tape . States compression garment/sleeve bothers her knee. Pain in knee is worse in the winter.    States she has compression garments but doesn't like them. States she had lots of swelling in her knee prior to her knee replacement.    PERTINENT HISTORY: Migraines, right knee scope 6 years, right partial medial compartment knee replacement 2020, left hip pain  PAIN:  Are you having pain? Yes: NPRS scale: 5/10 Pain location: right foot at 5th base of met Pain description: throbbing Aggravating factors: light touch, at night the touching the side of the bed Relieving factors: not touching it    PRECAUTIONS: None   WEIGHT BEARING RESTRICTIONS: No   FALLS:  Has patient fallen in last 6 months? No     OCCUPATION: accountant    PLOF: Independent   PATIENT GOALS: to have less pain      OBJECTIVE:    DIAGNOSTIC FINDINGS:  Right foot xray 04/27/22 FINDINGS: Normal alignment. No acute fracture. Degenerative changes of the midfoot along the TMT joints. Small  calcaneal enthesophyte of the plantar fascia. Soft tissues are unremarkable.   IMPRESSION: Scattered degenerative changes as described. No acute osseous abnormality.   PATIENT SURVEYS:  FOTO next session   COGNITION: Overall cognitive status: Within functional limits for tasks assessed                         SENSATION: Hypersensitivity to touch along right lateral side of foot   EDEMA:  Calf L- 16.75 inches, R 17.25 inches Thighs L -23.0 inches 24.0 inches  R Pitting edema 3 on right and 2 on left        POSTURE: rounded shoulders and flexed trunk , forwards head    PALPATION: Tenderness at right th base of met  Tenderness sand increased pain in Left UT     UE  Measurements Upper Extremity Right 2/19 Left 2/19     A/PROM MMT  Shoulder Flexion WFL 4-* [facility] 4-  Shoulder Extension      Shoulder Abduction  3+  3+  Shoulder Adduction      Shoulder Internal Rotation WFL** 3+ WFL** 3+  Shoulder External Rotation WFL 3+ WFL 3+  Elbow Flexion      Elbow Extension      Wrist Flexion      Wrist Extension      Wrist Supination      Wrist Pronation      Wrist Ulnar Deviation      Wrist Radial Deviation      Grip Strength NA  NA     (Blank rows = not tested)   * pain   ** rolls shoulder forwards    LE Measurements       Lower Extremity Right 06/06/2022 Left 06/06/2022    A/PROM MMT A/PROM MMT  Hip Flexion   4   4  Hip Extension          Hip Abduction          Hip Adduction          Hip Internal rotation          Hip External rotation          Knee Flexion   4   4  Knee Extension   4   4+  Ankle Dorsiflexion          Ankle Plantarflexion          Ankle Inversion 30 4+ 30 4+  Ankle Eversion 5 4+ 5 4+   (Blank rows = not tested) * pain     LOWER EXTREMITY SPECIAL TESTS:  SLS 15 seconds B       TODAY'S TREATMENT:                                                                                                                              DATE:    06/13/2022  Therapeutic Exercise:  Aerobic: Supine: Prone:  Seated: toe yoga  10 minutes, foot inversion/eversion 5 minutes   Standing: shoulder extension with towel 10 minutes total  Neuromuscular Re-education: Manual Therapy: Therapeutic Activity: Self Care: Trigger Point Dry Needling:  Modalities:       PATIENT EDUCATION:  Education details: on HEP, on findings during assessment of shoulder, importance of isolated muscle activation and joint mobility throughout body in all joints, and anatomy Person educated: Patient Education method: Explanation, Demonstration, and Handouts Education comprehension: verbalized understanding     HOME EXERCISE PROGRAM: [id number]    ASSESSMENT:   CLINICAL IMPRESSION: 06/13/2022 Session focused on progression of ankle/foot exercises as well as education on current reports of swelling and pain.  Reduced hypersensitivity with palpation along right lateral side of foot.  Minimal isolated foot and ankle motions noted with hip and knee compensating with most motions.  Educated patient on this and rationale for isolated motions to reduce stress at other joints.  Increased resting tone in left upper trap notably at rest.  Patient performs rolling of entire shoulder girdle anterior with any posterior motions which also elicit pain.  Educated patient on this and practice isolated glenohumeral shoulder motion which improved patient's ability to reach behind her back pain-free.  Patient would greatly benefit from skilled physical therapy to improve overall function and mobility. I am again Eval: Patient presents to physical therapy with complaints of right foot pain left hip pain shoulder neck pain as well as right knee pain.  Session focused on lower extremities on this date but will assess shoulder in future sessions.  Pain in right foot presents with palpation but not with manual muscle testing, range of motion more single-leg stance.   OBJECTIVE IMPAIRMENTS: Abnormal gait, decreased activity tolerance, decreased balance, decreased endurance, decreased mobility, difficulty walking, decreased ROM, decreased strength, postural dysfunction, and pain.    ACTIVITY LIMITATIONS: sitting, standing, sleeping, and locomotion level   PARTICIPATION LIMITATIONS: community activity and occupation   PERSONAL FACTORS: Fitness, Past/current experiences, and Profession are also affecting patient's functional outcome.    REHAB POTENTIAL: Good   CLINICAL DECISION MAKING: Stable/uncomplicated   EVALUATION COMPLEXITY: Low     GOALS: Goals reviewed with patient? yes   SHORT TERM GOALS: Target date: 07/11/2022  Patient will be independent in self  management strategies to improve quality of life and functional outcomes. Baseline: New Program Goal status: INITIAL   2.  Patient will report at least 25% improvement in overall symptoms and/or function to demonstrate improved functional mobility Baseline: 0% better Goal status: INITIAL   3.  Patient will report no swelling in legs during the day to reduce pain in legs Baseline: current swelling Goal status: INITIAL   4.  Patient will be able to sleep without pain to improve sleep quality. Baseline:  Goal status: INITIAL       LONG TERM GOALS: Target date: 08/15/2022    Patient will report at least 75% improvement in overall symptoms and/or function to demonstrate improved functional mobility Baseline: 0% better Goal status: INITIAL   2.  Patient will improve score on FOTO outcomes measure to projected score to demonstrate overall improved function and QOL Baseline: see above Goal status: INITIAL   3.  Patient will be able to demonstrate at least 4+/5 MMT in B LE Baseline:  Goal status: INITIAL           PLAN:   PT FREQUENCY: 1-2x/week for total of 10 visits over 10 weeks    PT DURATION: 10 weeks   PLANNED INTERVENTIONS: Therapeutic exercises, Therapeutic  activity, Neuromuscular re-education, Balance training, Gait training, Patient/Family education, Self Care, Joint mobilization, Joint manipulation, Stair training, Canalith repositioning, Orthotic/Fit training, DME instructions, Aquatic Therapy, Dry Needling, Electrical stimulation, Cryotherapy, Moist heat, Taping, Traction, Ultrasound, Ionotophoresis 42m/ml Dexamethasone, Manual therapy, and Re-evaluation   PLAN FOR NEXT SESSION: Isolated shoulder motions especially with reaching behind back and internal and external rotation isolated foot and toe motions, f/u on edema, hip/knee/ankle strengthening   2:00 PM, 06/13/22 MJerene Pitch DPT Physical Therapy with [facility]

## 2022-06-09 NOTE — Progress Notes (Signed)
Please call patient: her potassium is too high; please stop the potassium supplements. She needs medications to bring down the level.  I have ordered it. Please return in am for repeat bmp.

## 2022-06-09 NOTE — Telephone Encounter (Signed)
Patient requests to be called at ph# [phone]  re: Patient has questions about the RX that was sent and her lab appointment scheduled for 06/10/22

## 2022-06-09 NOTE — Telephone Encounter (Signed)
Patient has been reached without success. I also tried calling work number. I sent her a TEXT and EMAIL  message with the following:   [employer].- Ms Majure it is very important that you contact our office regarding Lab results. Please call [phone]

## 2022-06-09 NOTE — Progress Notes (Signed)
Patient has been reached 3 times including calling work phone number.   Text message was sent at 121pm stating: [employer].- Ms Kash it is very important that you contact our office regarding Lab results. Please call [phone].

## 2022-06-09 NOTE — Telephone Encounter (Signed)
Critical lab:  Potassium @ 6.2 Blood was not hemolyzed

## 2022-06-10 ENCOUNTER — Other Ambulatory Visit: Payer: BC Managed Care – PPO

## 2022-06-10 ENCOUNTER — Other Ambulatory Visit (HOSPITAL_COMMUNITY): Payer: Self-pay

## 2022-06-10 DIAGNOSIS — E875 Hyperkalemia: Secondary | ICD-10-CM

## 2022-06-10 NOTE — Telephone Encounter (Signed)
Spoke with pt in regards to her questions/concerns

## 2022-06-10 NOTE — Addendum Note (Signed)
Addended by: Loura Back on: 06/10/2022 10:11 AM   Modules accepted: Orders

## 2022-06-11 LAB — BASIC METABOLIC PANEL
BUN: 17 mg/dL (ref 7–25)
CO2: 23 mmol/L (ref 20–32)
Calcium: 9.3 mg/dL (ref 8.6–10.4)
Chloride: 100 mmol/L (ref 98–110)
Creat: 0.93 mg/dL (ref 0.50–1.03)
Glucose, Bld: 94 mg/dL (ref 65–99)
Potassium: 3.7 mmol/L (ref 3.5–5.3)
Sodium: 138 mmol/L (ref 135–146)

## 2022-06-13 ENCOUNTER — Encounter: Payer: Self-pay | Admitting: Physical Therapy

## 2022-06-13 ENCOUNTER — Ambulatory Visit: Payer: BC Managed Care – PPO | Admitting: Physical Therapy

## 2022-06-13 DIAGNOSIS — M6281 Muscle weakness (generalized): Secondary | ICD-10-CM

## 2022-06-13 DIAGNOSIS — M79671 Pain in right foot: Secondary | ICD-10-CM

## 2022-06-13 DIAGNOSIS — M25512 Pain in left shoulder: Secondary | ICD-10-CM

## 2022-06-13 DIAGNOSIS — G8929 Other chronic pain: Secondary | ICD-10-CM

## 2022-06-13 DIAGNOSIS — M542 Cervicalgia: Secondary | ICD-10-CM

## 2022-06-16 ENCOUNTER — Encounter: Payer: Self-pay | Admitting: Physician Assistant

## 2022-06-16 ENCOUNTER — Ambulatory Visit: Payer: BC Managed Care – PPO | Admitting: Physician Assistant

## 2022-06-16 VITALS — BP 124/70 | HR 60 | Temp 97.7°F | Ht 64.0 in | Wt 201.4 lb

## 2022-06-16 DIAGNOSIS — E538 Deficiency of other specified B group vitamins: Secondary | ICD-10-CM

## 2022-06-16 DIAGNOSIS — G4733 Obstructive sleep apnea (adult) (pediatric): Secondary | ICD-10-CM

## 2022-06-16 DIAGNOSIS — E669 Obesity, unspecified: Secondary | ICD-10-CM

## 2022-06-16 DIAGNOSIS — I10 Essential (primary) hypertension: Secondary | ICD-10-CM

## 2022-06-16 DIAGNOSIS — E559 Vitamin D deficiency, unspecified: Secondary | ICD-10-CM | POA: Diagnosis not present

## 2022-06-16 DIAGNOSIS — G43009 Migraine without aura, not intractable, without status migrainosus: Secondary | ICD-10-CM

## 2022-06-16 DIAGNOSIS — E785 Hyperlipidemia, unspecified: Secondary | ICD-10-CM

## 2022-06-16 LAB — LIPID PANEL
Cholesterol: 198 mg/dL (ref 0–200)
HDL: 58.5 mg/dL (ref >39.00)
LDL Cholesterol: 112 mg/dL — ABNORMAL HIGH (ref 0–99)
NonHDL: 139.18
Total CHOL/HDL Ratio: 3
Triglycerides: 138 mg/dL (ref 0.0–149.0)
VLDL: 27.6 mg/dL (ref 0.0–40.0)

## 2022-06-16 LAB — COMPREHENSIVE METABOLIC PANEL
ALT: 36 U/L — ABNORMAL HIGH (ref 0–35)
AST: 28 U/L (ref 0–37)
Albumin: 4.3 g/dL (ref 3.5–5.2)
Alkaline Phosphatase: 70 U/L (ref 39–117)
BUN: 20 mg/dL (ref 6–23)
CO2: 31 mEq/L (ref 19–32)
Calcium: 9.8 mg/dL (ref 8.4–10.5)
Chloride: 100 mEq/L (ref 96–112)
Creatinine, Ser: 0.84 mg/dL (ref 0.40–1.20)
GFR: 76.76 mL/min (ref >60.00)
Glucose, Bld: 94 mg/dL (ref 70–99)
Potassium: 3.8 mEq/L (ref 3.5–5.1)
Sodium: 140 mEq/L (ref 135–145)
Total Bilirubin: 0.9 mg/dL (ref 0.2–1.2)
Total Protein: 7.1 g/dL (ref 6.0–8.3)

## 2022-06-16 LAB — VITAMIN B12: Vitamin B-12: 316 pg/mL (ref 211–911)

## 2022-06-16 LAB — VITAMIN D 25 HYDROXY (VIT D DEFICIENCY, FRACTURES): VITD: 46.56 ng/mL (ref 30.00–100.00)

## 2022-06-16 LAB — HEMOGLOBIN A1C: Hgb A1c MFr Bld: 5.9 % (ref 4.6–6.5)

## 2022-06-16 NOTE — Progress Notes (Signed)
[name] [name] is a 57 y.o. female here for an establishment of care.  History of Present Illness:   Chief Complaint  Patient presents with   Transfer of care   Hypertension    HPI  Hypertension Patient regularly checks her blood pressure at home and is complaint with 25 mg tenormin and 25 mg hydrodiuril with no concerns. She states that she saw Jerene Pitch recently in physical therapist and was told she had edema in her right leg. Patient doubled her own hydrodiuril medication which she states "fixed her blood pressure".  She is requesting a refill on her hydrodiuril medication.   Vitamin D and B12 Deficiency; HLD Patient is requesting to have her metabolic labs, cholestenol, vitamin B12, and vitamin D levels checked today. Has family history of heart disease, interested in calcium score.   Migraine  Patient reports that on January 25th, she experienced a headache when in an [employer] which she states had "weird lighting". While looking at the art, she states that she lost vision in one of her eyes as she couldn't fully see the paintings in front of her. She states that her vision returned not too long afterwards. As she was continuing in [employer], she was looking up at a painting when she experienced vertigo which she states is normal for her. Patient states that vertigo and blurry vision can be symptoms when she does experience migraines. She has a hx of migraines with similar vision changes but doesn't usually lose as much vision as she did for this. Has hx of vertigo with migraines and without.  OSA Patient has hx of OSA dx over 20 years ago Has not used CPAP in over 10 years  Having issues sleeping, would like updated sleep study and further eval   Past Medical History:  Diagnosis Date   Arthritis    Chicken pox    GERD (gastroesophageal reflux disease)    Hypertension    Liver lesion    Hepatic Adenoma   Migraines      Social History   Tobacco Use   Smoking  status: Never   Smokeless tobacco: Never  Vaping Use   Vaping Use: Never used  Substance Use Topics   Alcohol use: Yes    Alcohol/week: 0.0 standard drinks of alcohol    Comment: occas   Drug use: No    Past Surgical History:  Procedure Laterality Date   KNEE ARTHROSCOPY Right 01/2017   NASAL SINUS SURGERY  02/2019    Family History  Problem Relation Age of Onset   Cancer - Lung Father    Diabetes Mother    Hyperlipidemia Mother    Cancer Maternal Grandmother    Diabetes Maternal Grandmother    Hyperlipidemia Maternal Grandmother    Hypertension Maternal Grandmother    Diabetes Maternal Grandfather    Hyperlipidemia Maternal Grandfather    Cancer Paternal Grandmother    Diabetes Paternal Grandmother    Heart disease Paternal Grandmother    Hyperlipidemia Paternal Grandmother    Hypertension Paternal Grandmother    Diabetes Sister    Hyperlipidemia Sister    Colon cancer Neg Hx    Colon polyps Neg Hx    Rectal cancer Neg Hx    Stomach cancer Neg Hx    Breast cancer Neg Hx     Allergies  Allergen Reactions   Lisinopril-Hydrochlorothiazide Cough   Amlodipine Other (See Comments)    Lower extremity edema.    Current Medications:   Current Outpatient  Medications:    atenolol (TENORMIN) 25 MG tablet, Take 1 tablet (25 mg total) by mouth daily., Disp: 90 tablet, Rfl: 3   gabapentin (NEURONTIN) 100 MG capsule, Take 1-3 capsules (100-300 mg total) by mouth at bedtime as needed (nerve pain)., Disp: 90 capsule, Rfl: 3   hydrochlorothiazide (HYDRODIURIL) 25 MG tablet, Take 1 tablet by mouth in the morning, Disp: 90 tablet, Rfl: 2   zolpidem (AMBIEN) 5 MG tablet, Take 1 tablet (5 mg total) by mouth at bedtime as needed., Disp: 30 tablet, Rfl: 3   potassium chloride SA (KLOR-CON M) 20 MEQ tablet, Take 1 tablet (20 mEq total) by mouth every other day. (Patient not taking: Reported on 06/16/2022), Disp: , Rfl:    Review of Systems:   ROS Negative unless otherwise specified  per HPI.  Vitals:   Vitals:   06/16/22 0923  BP: 124/70  Pulse: 60  Temp: 97.7 F (36.5 C)  TempSrc: Temporal  SpO2: 96%  Weight: 201 lb 6.1 oz (91.3 kg)  Height: 5' 4"$  (1.626 m)     Body mass index is 34.57 kg/m.  Physical Exam:   Physical Exam Constitutional:      General: She is not in acute distress.    Appearance: Normal appearance. She is not ill-appearing.  HENT:     Head: Normocephalic and atraumatic.     Right Ear: External ear normal.     Left Ear: External ear normal.  Eyes:     Extraocular Movements: Extraocular movements intact.     Pupils: Pupils are equal, round, and reactive to light.  Cardiovascular:     Rate and Rhythm: Normal rate and regular rhythm.     Heart sounds: Normal heart sounds. No murmur heard.    No gallop.  Pulmonary:     Effort: Pulmonary effort is normal. No respiratory distress.     Breath sounds: Normal breath sounds. No wheezing or rales.  Skin:    General: Skin is warm and dry.  Neurological:     General: No focal deficit present.     Mental Status: She is alert and oriented to person, place, and time.     Cranial Nerves: Cranial nerves 2-12 are intact.     Sensory: Sensation is intact.     Motor: Motor function is intact.     Coordination: Coordination is intact.     Gait: Gait is intact.  Psychiatric:        Judgment: Judgment normal.     Assessment and Plan:   B12 deficiency Update B12 and make recommendations accordingly  Vitamin D deficiency Update vitamin D and make recommendations accordingly  Primary hypertension Normotensive Continue HCTZ 25 mg daily Follow-up in 3 mo, sooner if concerns  Obesity, unspecified classification, unspecified obesity type, unspecified whether serious comorbidity present Continue efforts at healthy diet and exercise  Hyperlipidemia, unspecified hyperlipidemia type Update lipid panel and order calcium score  Migraine without aura and without status migrainosus, not  intractable Neuro exam normal Will order MRI given recent atypical sx If new/worsening sx in the meantime, recommend ER evaluation  OSA Referral to sleep studies  I,Verona Buck,acting as a scribe for [employer], PA.,have documented all relevant documentation on the behalf of Inda Coke, PA,as directed by  Inda Coke, PA while in the presence of Inda Coke, [place].  I, Inda Coke, [place], have reviewed all documentation for this visit. The documentation on 06/16/22 for the exam, diagnosis, procedures, and orders are all accurate and complete.  Time  spent with patient today was 55 minutes which consisted of chart review, discussing diagnosis, work up, treatment answering questions and documentation.   Inda Coke, PA-C

## 2022-06-16 NOTE — Addendum Note (Signed)
Addended by: Erlene Quan on: 06/16/2022 10:56 AM   Modules accepted: Orders

## 2022-06-16 NOTE — Patient Instructions (Signed)
It was great to see you!  We will order MRI and Calcium Score of your heart  Update blood work today  Referral to Neurology for sleep study  Let's follow-up in 3-6 months for a physical, sooner if you have concerns.  If a referral was placed today --> you will be contacted for an appointment. Please note that routine referrals can sometimes take up to 3-4 weeks to process. Please call our office if you haven't heard anything after this time frame.  If blood work, urine studies, or any imaging was ordered today -->  we will release your results to you on your MyChart account (if you have chosen to sign up for this) with further instructions. You may see the results before I do, but when I review them I will send you a message with my report or have my staff call you if things need to be discussed. Please reply to my message with any questions.   Take care,  Inda Coke PA-C

## 2022-06-20 ENCOUNTER — Ambulatory Visit: Payer: BC Managed Care – PPO | Admitting: Physical Therapy

## 2022-06-20 ENCOUNTER — Encounter: Payer: Self-pay | Admitting: Physical Therapy

## 2022-06-20 DIAGNOSIS — M25512 Pain in left shoulder: Secondary | ICD-10-CM | POA: Diagnosis not present

## 2022-06-20 DIAGNOSIS — M542 Cervicalgia: Secondary | ICD-10-CM

## 2022-06-20 DIAGNOSIS — G8929 Other chronic pain: Secondary | ICD-10-CM

## 2022-06-20 DIAGNOSIS — M79671 Pain in right foot: Secondary | ICD-10-CM

## 2022-06-20 DIAGNOSIS — M6281 Muscle weakness (generalized): Secondary | ICD-10-CM | POA: Diagnosis not present

## 2022-06-20 NOTE — Therapy (Signed)
OUTPATIENT PHYSICAL THERAPY TREATMENT NOTE   Patient Name: [name] [name] MRN: [id number] DOB:1965/12/23, 57 y.o., female Today's Date: 06/20/2022  PCP: Michael Boston, MD   REFERRING PROVIDER: Gregor Hams, MD  END OF SESSION:   PT End of Session - 06/20/22 1518     Visit Number 3    Number of Visits 10    Date for PT Re-Evaluation 08/15/22    PT Start Time 1519    PT Stop Time 1558    PT Time Calculation (min) 39 min    Activity Tolerance Patient tolerated treatment well             Past Medical History:  Diagnosis Date   Arthritis    Chicken pox    GERD (gastroesophageal reflux disease)    Hypertension    Liver lesion    Hepatic Adenoma   Migraines    Past Surgical History:  Procedure Laterality Date   KNEE ARTHROSCOPY Right 01/2017   NASAL SINUS SURGERY  02/2019   Patient Active Problem List   Diagnosis Date Noted   Fatty liver 07/17/2019   Migraines 04/14/2018   Vitamin D deficiency 04/14/2018   Primary insomnia 04/14/2018   GERD (gastroesophageal reflux disease)    Menopausal syndrome 04/13/2018   Chronic pansinusitis 07/06/2016   Tinnitus of right ear 07/06/2016   Hypertension 01/23/2016   OSA (obstructive sleep apnea) 06/22/2007   Hepatic adenoma 04/10/2007    THERAPY DIAG:  Pain in right foot  Muscle weakness (generalized)  Chronic left shoulder pain  Neck pain    REFERRING DIAG: M79.671 (ICD-10-CM) - Right foot pain    Rationale for Evaluation and Treatment: Rehabilitation   ONSET DATE: Summer 2023   SUBJECTIVE:    SUBJECTIVE STATEMENT: 06/20/2022 States that her exercises are interesting but she is attempting ot do them. States her foot is hurting more and now is hurting when she walks. Stats that her knee is hurting more. States that she took compression stockings off and noticed bruising along the inside of her ankle. States they have changed some meds to try and help with swelling. Still has discoloration along the inside of  her ankle.  States that her foot will hurt  on the side of her right foot and has been swelling the last month. States it hurts at rest and to the touch. States that prolonged standing may bother it. States she has knee arthritis and swelling and it feels heavy. States that she keeps her right knee taped with KT tape . States compression garment/sleeve bothers her knee. Pain in knee is worse in the winter.    States she has compression garments but doesn't like them. States she had lots of swelling in her knee prior to her knee replacement.    PERTINENT HISTORY: Migraines, right knee scope 6 years, right partial medial compartment knee replacement 2020, left hip pain  PAIN:  Are you having pain? Yes: NPRS scale: 5/10 Pain location: right foot at 5th base of met Pain description: throbbing Aggravating factors: light touch, at night the touching the side of the bed Relieving factors: not touching it    PRECAUTIONS: None   WEIGHT BEARING RESTRICTIONS: No   FALLS:  Has patient fallen in last 6 months? No     OCCUPATION: accountant    PLOF: Independent   PATIENT GOALS: to have less pain      OBJECTIVE:    DIAGNOSTIC FINDINGS:  Right foot xray 04/27/22 FINDINGS: Normal alignment. No acute  fracture. Degenerative changes of the midfoot along the TMT joints. Small calcaneal enthesophyte of the plantar fascia. Soft tissues are unremarkable.   IMPRESSION: Scattered degenerative changes as described. No acute osseous abnormality.   PATIENT SURVEYS:  FOTO next session   COGNITION: Overall cognitive status: Within functional limits for tasks assessed                         SENSATION: Hypersensitivity to touch along right lateral side of foot   EDEMA:  Calf L- 16.75 inches, R 17.25 inches Thighs L -23.0 inches 24.0 inches  R Pitting edema 3 on right and 2 on left        POSTURE: rounded shoulders and flexed trunk , forwards head    PALPATION: Tenderness at right th base  of met  Tenderness sand increased pain in Left UT     UE Measurements Upper Extremity Right 2/19 Left 2/19     A/PROM MMT  Shoulder Flexion WFL 4-* [facility] 4-  Shoulder Extension      Shoulder Abduction  3+  3+  Shoulder Adduction      Shoulder Internal Rotation WFL** 3+ WFL** 3+  Shoulder External Rotation WFL 3+ WFL 3+  Elbow Flexion      Elbow Extension      Wrist Flexion      Wrist Extension      Wrist Supination      Wrist Pronation      Wrist Ulnar Deviation      Wrist Radial Deviation      Grip Strength NA  NA     (Blank rows = not tested)   * pain   ** rolls shoulder forwards    LE Measurements       Lower Extremity Right 06/06/2022 Left 06/06/2022    A/PROM MMT A/PROM MMT  Hip Flexion   4   4  Hip Extension          Hip Abduction          Hip Adduction          Hip Internal rotation          Hip External rotation          Knee Flexion   4   4  Knee Extension   4   4+  Ankle Dorsiflexion          Ankle Plantarflexion          Ankle Inversion 30 4+ 30 4+  Ankle Eversion 5 4+ 5 4+   (Blank rows = not tested) * pain     LOWER EXTREMITY SPECIAL TESTS:  SLS 15 seconds B       TODAY'S TREATMENT:                                                                                                                              DATE:    06/20/2022  Therapeutic Exercise:  Aerobic: Supine: Prone:  Seated: toe yoga 5 minutes, foot inversion/eversion 5 minutes, wrist flexion/extension/prayer stretch 5 minutes  Standing  Neuromuscular Re-education: Manual Therapy: percussion gun with pad for comfort to right hand/leg/foot and arm - how and why education during STM 18 minutes Therapeutic Activity: Self Care: Trigger Point Dry Needling:  Modalities:       PATIENT EDUCATION:  Education details: on HEP, on  possible causes of swelling/bruising/on benefits of mobility and strength within mobility Person educated: Patient Education method: Explanation,  Demonstration, and Handouts Education comprehension: verbalized understanding     HOME EXERCISE PROGRAM: [id number]   ASSESSMENT:   CLINICAL IMPRESSION: 06/20/2022 Session focused on education and review of previous exercises.  Discussed possibility of rolling ankle and while walking or sitting that could have caused the bruising along the medial side of ankle and how it is unlikely that the compression stockings himself caused the bruising.  Reassured patient and discussed possible benefits of mobility in all joints as well as use of vibration with her percussion gun at home.  Tolerated percussion gun interventions well today reporting less tightness in right hand and less pain in foot and leg.  Will continue with current POC as tolerated   Eval: Patient presents to physical therapy with complaints of right foot pain left hip pain shoulder neck pain as well as right knee pain.  Session focused on lower extremities on this date but will assess shoulder in future sessions.  Pain in right foot presents with palpation but not with manual muscle testing, range of motion more single-leg stance.   OBJECTIVE IMPAIRMENTS: Abnormal gait, decreased activity tolerance, decreased balance, decreased endurance, decreased mobility, difficulty walking, decreased ROM, decreased strength, postural dysfunction, and pain.    ACTIVITY LIMITATIONS: sitting, standing, sleeping, and locomotion level   PARTICIPATION LIMITATIONS: community activity and occupation   PERSONAL FACTORS: Fitness, Past/current experiences, and Profession are also affecting patient's functional outcome.    REHAB POTENTIAL: Good   CLINICAL DECISION MAKING: Stable/uncomplicated   EVALUATION COMPLEXITY: Low     GOALS: Goals reviewed with patient? yes   SHORT TERM GOALS: Target date: 07/11/2022  Patient will be independent in self management strategies to improve quality of life and functional outcomes. Baseline: New Program Goal  status: INITIAL   2.  Patient will report at least 25% improvement in overall symptoms and/or function to demonstrate improved functional mobility Baseline: 0% better Goal status: INITIAL   3.  Patient will report no swelling in legs during the day to reduce pain in legs Baseline: current swelling Goal status: INITIAL   4.  Patient will be able to sleep without pain to improve sleep quality. Baseline:  Goal status: INITIAL       LONG TERM GOALS: Target date: 08/15/2022    Patient will report at least 75% improvement in overall symptoms and/or function to demonstrate improved functional mobility Baseline: 0% better Goal status: INITIAL   2.  Patient will improve score on FOTO outcomes measure to projected score to demonstrate overall improved function and QOL Baseline: see above Goal status: INITIAL   3.  Patient will be able to demonstrate at least 4+/5 MMT in B LE Baseline:  Goal status: INITIAL           PLAN:   PT FREQUENCY: 1-2x/week for total of 10 visits over 10 weeks    PT DURATION: 10 weeks   PLANNED INTERVENTIONS: Therapeutic exercises, Therapeutic activity, Neuromuscular re-education, Balance training, Gait training, Patient/Family education, Self  Care, Joint mobilization, Joint manipulation, Stair training, Canalith repositioning, Orthotic/Fit training, DME instructions, Aquatic Therapy, Dry Needling, Electrical stimulation, Cryotherapy, Moist heat, Taping, Traction, Ultrasound, Ionotophoresis '4mg'$ /ml Dexamethasone, Manual therapy, and Re-evaluation   PLAN FOR NEXT SESSION: Isolated shoulder motions especially with reaching behind back and internal and external rotation isolated foot and toe motions, f/u on edema, hip/knee/ankle strengthening   4:05 PM, 06/20/22 Jerene Pitch, DPT Physical Therapy with [facility]

## 2022-06-27 ENCOUNTER — Encounter: Payer: Self-pay | Admitting: Physical Therapy

## 2022-06-27 ENCOUNTER — Ambulatory Visit: Payer: BC Managed Care – PPO | Admitting: Physical Therapy

## 2022-06-27 DIAGNOSIS — M542 Cervicalgia: Secondary | ICD-10-CM | POA: Diagnosis not present

## 2022-06-27 DIAGNOSIS — M25512 Pain in left shoulder: Secondary | ICD-10-CM | POA: Diagnosis not present

## 2022-06-27 DIAGNOSIS — M79671 Pain in right foot: Secondary | ICD-10-CM | POA: Diagnosis not present

## 2022-06-27 DIAGNOSIS — M6281 Muscle weakness (generalized): Secondary | ICD-10-CM | POA: Diagnosis not present

## 2022-06-27 DIAGNOSIS — G8929 Other chronic pain: Secondary | ICD-10-CM

## 2022-06-27 NOTE — Therapy (Addendum)
OUTPATIENT PHYSICAL THERAPY TREATMENT NOTE  PHYSICAL THERAPY DISCHARGE SUMMARY  Visits from Start of Care: 4  Current functional level related to goals / functional outcomes: Unable to assess due to unplanned discharge    Remaining deficits: Unable to assess due to unplanned discharge    Education / Equipment: Unable to assess due to unplanned discharge    Patient agrees to discharge. Patient goals were not met. Patient is being discharged due to not returning since the last visit.  9:24 AM, 10/18/22 Tereasa Coop, DPT Physical Therapy with [facility]   Patient Name: [name] [name] MRN: [id number] DOB:1965-11-11, 57 y.o., female Today's Date: 06/27/2022  PCP: Melida Quitter, MD   REFERRING PROVIDER: Rodolph Bong, MD  END OF SESSION:   PT End of Session - 06/27/22 0936     Visit Number 4    Number of Visits 10    Date for PT Re-Evaluation 08/15/22    PT Start Time 0936    PT Stop Time 1017    PT Time Calculation (min) 41 min    Activity Tolerance Patient tolerated treatment well             Past Medical History:  Diagnosis Date   Arthritis    Chicken pox    GERD (gastroesophageal reflux disease)    Hypertension    Liver lesion    Hepatic Adenoma   Migraines    Past Surgical History:  Procedure Laterality Date   KNEE ARTHROSCOPY Right 01/2017   NASAL SINUS SURGERY  02/2019   Patient Active Problem List   Diagnosis Date Noted   Fatty liver 07/17/2019   Migraines 04/14/2018   Vitamin D deficiency 04/14/2018   Primary insomnia 04/14/2018   GERD (gastroesophageal reflux disease)    Menopausal syndrome 04/13/2018   Chronic pansinusitis 07/06/2016   Tinnitus of right ear 07/06/2016   Hypertension 01/23/2016   OSA (obstructive sleep apnea) 06/22/2007   Hepatic adenoma 04/10/2007    THERAPY DIAG:  Pain in right foot  Muscle weakness (generalized)  Chronic left shoulder pain  Neck pain    REFERRING DIAG: M79.671 (ICD-10-CM) - Right foot  pain    Rationale for Evaluation and Treatment: Rehabilitation   ONSET DATE: Summer 2023   SUBJECTIVE:    SUBJECTIVE STATEMENT: 06/27/2022 States that she  going to [place] and needs to walk for 4 days. Has bene having more pain. States that the pain is bigger spread and more frequent and a little sharper. States that he bruise has cleared up. States she is having more pain with continued walking and walking on hard surfaces tennis shoes. States that the percussion gun and that has helped with the pain.   States that her foot will hurt  on the side of her right foot and has been swelling the last month. States it hurts at rest and to the touch. States that prolonged standing may bother it. States she has knee arthritis and swelling and it feels heavy. States that she keeps her right knee taped with KT tape . States compression garment/sleeve bothers her knee. Pain in knee is worse in the winter.    States she has compression garments but doesn't like them. States she had lots of swelling in her knee prior to her knee replacement.    PERTINENT HISTORY: Migraines, right knee scope 6 years, right partial medial compartment knee replacement 2020, left hip pain  PAIN:  Are you having pain? Yes: NPRS scale: 5/10 Pain location: right foot at  5th base of met Pain description: throbbing Aggravating factors: light touch, at night the touching the side of the bed Relieving factors: not touching it    PRECAUTIONS: None   WEIGHT BEARING RESTRICTIONS: No   FALLS:  Has patient fallen in last 6 months? No     OCCUPATION: accountant    PLOF: Independent   PATIENT GOALS: to have less pain      OBJECTIVE:    DIAGNOSTIC FINDINGS:  Right foot xray 04/27/22 FINDINGS: Normal alignment. No acute fracture. Degenerative changes of the midfoot along the TMT joints. Small calcaneal enthesophyte of the plantar fascia. Soft tissues are unremarkable.   IMPRESSION: Scattered degenerative changes as  described. No acute osseous abnormality.   PATIENT SURVEYS:  FOTO next session   COGNITION: Overall cognitive status: Within functional limits for tasks assessed                         SENSATION: Hypersensitivity to touch along right lateral side of foot   EDEMA:  Calf L- 16.75 inches, R 17.25 inches Thighs L -23.0 inches 24.0 inches  R Pitting edema 3 on right and 2 on left        POSTURE: rounded shoulders and flexed trunk , forwards head    PALPATION: Tenderness at right th base of met  Tenderness sand increased pain in Left UT     UE Measurements Upper Extremity Right 2/19 Left 2/19     A/PROM MMT  Shoulder Flexion WFL 4-* [facility] 4-  Shoulder Extension      Shoulder Abduction  3+  3+  Shoulder Adduction      Shoulder Internal Rotation WFL** 3+ WFL** 3+  Shoulder External Rotation WFL 3+ WFL 3+  Elbow Flexion      Elbow Extension      Wrist Flexion      Wrist Extension      Wrist Supination      Wrist Pronation      Wrist Ulnar Deviation      Wrist Radial Deviation      Grip Strength NA  NA     (Blank rows = not tested)   * pain   ** rolls shoulder forwards    LE Measurements       Lower Extremity Right 06/06/2022 Left 06/06/2022    A/PROM MMT A/PROM MMT  Hip Flexion   4   4  Hip Extension          Hip Abduction          Hip Adduction          Hip Internal rotation          Hip External rotation          Knee Flexion   4   4  Knee Extension   4   4+  Ankle Dorsiflexion          Ankle Plantarflexion          Ankle Inversion 30 4+ 30 4+  Ankle Eversion 5 4+ 5 4+   (Blank rows = not tested) * pain     LOWER EXTREMITY SPECIAL TESTS:  SLS 15 seconds B       TODAY'S TREATMENT:  DATE:    06/27/2022  Therapeutic Exercise:  Aerobic: Supine: Prone:  Seated:review of HEP exercises, self mobilization to right LE with tiger  tail and tennis ball 12 minutes  Standing  Neuromuscular Re-education:proprioceptive taping to  right ankle and lower leg - and how 10 minutes Manual Therapy:  Gait training: use of cane in left hand - 15 minutes practicing heel toe gait Self Care: Trigger Point Dry Needling:  Modalities:       PATIENT EDUCATION:  Education details: on HEP, on benefits and how to use cane/trekking poles, on how to tape and different ways to tape, on strategies to walk Person educated: Patient Education method: Explanation, Demonstration, and Handouts Education comprehension: verbalized understanding     HOME EXERCISE PROGRAM: [facility]   ASSESSMENT:   CLINICAL IMPRESSION: 06/27/2022 Patient with concerns for upcoming trip and continued pain with walking. Discussed and practiced strategies for this and possible need to f/u with MD about continued pain. Tolerated tapping well and use of cane well with reduced lateral motions and improved gait. Will f/u with patient with tolerance to 4 days of city walking next session.    Eval: Patient presents to physical therapy with complaints of right foot pain left hip pain shoulder neck pain as well as right knee pain.  Session focused on lower extremities on this date but will assess shoulder in future sessions.  Pain in right foot presents with palpation but not with manual muscle testing, range of motion more single-leg stance.   OBJECTIVE IMPAIRMENTS: Abnormal gait, decreased activity tolerance, decreased balance, decreased endurance, decreased mobility, difficulty walking, decreased ROM, decreased strength, postural dysfunction, and pain.    ACTIVITY LIMITATIONS: sitting, standing, sleeping, and locomotion level   PARTICIPATION LIMITATIONS: community activity and occupation   PERSONAL FACTORS: Fitness, Past/current experiences, and Profession are also affecting patient's functional outcome.    REHAB POTENTIAL: Good   CLINICAL DECISION MAKING:  Stable/uncomplicated   EVALUATION COMPLEXITY: Low     GOALS: Goals reviewed with patient? yes   SHORT TERM GOALS: Target date: 07/11/2022  Patient will be independent in self management strategies to improve quality of life and functional outcomes. Baseline: New Program Goal status: INITIAL   2.  Patient will report at least 25% improvement in overall symptoms and/or function to demonstrate improved functional mobility Baseline: 0% better Goal status: INITIAL   3.  Patient will report no swelling in legs during the day to reduce pain in legs Baseline: current swelling Goal status: INITIAL   4.  Patient will be able to sleep without pain to improve sleep quality. Baseline:  Goal status: INITIAL       LONG TERM GOALS: Target date: 08/15/2022    Patient will report at least 75% improvement in overall symptoms and/or function to demonstrate improved functional mobility Baseline: 0% better Goal status: INITIAL   2.  Patient will improve score on FOTO outcomes measure to projected score to demonstrate overall improved function and QOL Baseline: see above Goal status: INITIAL   3.  Patient will be able to demonstrate at least 4+/5 MMT in B LE Baseline:  Goal status: INITIAL           PLAN:   PT FREQUENCY: 1-2x/week for total of 10 visits over 10 weeks    PT DURATION: 10 weeks   PLANNED INTERVENTIONS: Therapeutic exercises, Therapeutic activity, Neuromuscular re-education, Balance training, Gait training, Patient/Family education, Self Care, Joint mobilization, Joint manipulation, Stair training, Canalith repositioning, Orthotic/Fit training, DME instructions, Aquatic Therapy, Dry  Needling, Electrical stimulation, Cryotherapy, Moist heat, Taping, Traction, Ultrasound, Ionotophoresis 4mg /ml Dexamethasone, Manual therapy, and Re-evaluation   PLAN FOR NEXT SESSION: Isolated shoulder motions especially with reaching behind back and internal and external rotation isolated foot  and toe motions, f/u on edema, hip/knee/ankle strengthening   11:04 AM, 06/27/22 Tereasa Coop, DPT Physical Therapy with [facility]

## 2022-06-28 ENCOUNTER — Other Ambulatory Visit: Payer: Self-pay

## 2022-06-29 ENCOUNTER — Other Ambulatory Visit (HOSPITAL_COMMUNITY): Payer: Self-pay

## 2022-06-29 ENCOUNTER — Ambulatory Visit (HOSPITAL_COMMUNITY)
Admission: RE | Admit: 2022-06-29 | Discharge: 2022-06-29 | Disposition: A | Discharge status: Home / Self Care | Payer: BC Managed Care – PPO | Source: Ambulatory Visit | Attending: Physician Assistant | Admitting: Physician Assistant

## 2022-06-29 ENCOUNTER — Encounter: Payer: Self-pay | Admitting: Physician Assistant

## 2022-06-29 ENCOUNTER — Ambulatory Visit (HOSPITAL_COMMUNITY): Payer: BC Managed Care – PPO

## 2022-06-29 DIAGNOSIS — E785 Hyperlipidemia, unspecified: Secondary | ICD-10-CM | POA: Insufficient documentation

## 2022-06-29 DIAGNOSIS — R931 Abnormal findings on diagnostic imaging of heart and coronary circulation: Secondary | ICD-10-CM

## 2022-06-30 ENCOUNTER — Other Ambulatory Visit: Payer: Self-pay | Admitting: Physician Assistant

## 2022-06-30 ENCOUNTER — Other Ambulatory Visit (HOSPITAL_COMMUNITY): Payer: Self-pay

## 2022-06-30 MED ORDER — HYDROCHLOROTHIAZIDE 25 MG PO TABS
ORAL_TABLET | ORAL | 2 refills | Status: DC
  Filled 2022-06-30 – 2022-07-05 (×2): qty 90, 90d supply, fill #0
  Filled 2022-10-03: qty 90, 90d supply, fill #1
  Filled 2023-01-03: qty 90, 90d supply, fill #2

## 2022-06-30 NOTE — Telephone Encounter (Signed)
Please advise if okay to increase HCTZ to 50 mg daily?

## 2022-07-02 ENCOUNTER — Other Ambulatory Visit (HOSPITAL_COMMUNITY): Payer: Self-pay

## 2022-07-04 ENCOUNTER — Other Ambulatory Visit (HOSPITAL_COMMUNITY): Payer: Self-pay

## 2022-07-04 ENCOUNTER — Encounter: Payer: BC Managed Care – PPO | Admitting: Physical Therapy

## 2022-07-05 ENCOUNTER — Encounter: Payer: Self-pay | Admitting: Physician Assistant

## 2022-07-05 ENCOUNTER — Other Ambulatory Visit (HOSPITAL_COMMUNITY): Payer: Self-pay

## 2022-07-07 ENCOUNTER — Other Ambulatory Visit (HOSPITAL_COMMUNITY): Payer: Self-pay

## 2022-07-07 DIAGNOSIS — Z01419 Encounter for gynecological examination (general) (routine) without abnormal findings: Secondary | ICD-10-CM | POA: Diagnosis not present

## 2022-07-07 DIAGNOSIS — Z6834 Body mass index (BMI) 34.0-34.9, adult: Secondary | ICD-10-CM | POA: Diagnosis not present

## 2022-07-07 MED ORDER — TRIAMCINOLONE ACETONIDE 0.1 % EX CREA
TOPICAL_CREAM | CUTANEOUS | 3 refills | Status: DC
  Filled 2022-07-07: qty 30, 10d supply, fill #0

## 2022-07-07 MED ORDER — PHENTERMINE HCL 37.5 MG PO TABS
37.5000 mg | ORAL_TABLET | Freq: Every morning | ORAL | 2 refills | Status: DC
  Filled 2022-07-07: qty 30, 30d supply, fill #0
  Filled 2022-09-20: qty 30, 30d supply, fill #1
  Filled 2022-11-09: qty 30, 30d supply, fill #2

## 2022-07-07 MED ORDER — NYSTATIN 100000 UNIT/GM EX CREA
TOPICAL_CREAM | CUTANEOUS | 3 refills | Status: DC
  Filled 2022-07-07: qty 30, 10d supply, fill #0

## 2022-07-11 ENCOUNTER — Encounter: Payer: BC Managed Care – PPO | Admitting: Physical Therapy

## 2022-07-11 ENCOUNTER — Ambulatory Visit
Admission: RE | Admit: 2022-07-11 | Discharge: 2022-07-11 | Disposition: A | Discharge status: Home / Self Care | Payer: BC Managed Care – PPO | Source: Ambulatory Visit | Attending: Physician Assistant | Admitting: Physician Assistant

## 2022-07-11 DIAGNOSIS — R29818 Other symptoms and signs involving the nervous system: Secondary | ICD-10-CM | POA: Diagnosis not present

## 2022-07-11 DIAGNOSIS — G43009 Migraine without aura, not intractable, without status migrainosus: Secondary | ICD-10-CM

## 2022-07-11 MED ORDER — GADOPICLENOL 0.5 MMOL/ML IV SOLN
10.0000 mL | Freq: Once | INTRAVENOUS | Status: AC | PRN
  Administered 2022-07-11: 10 mL via INTRAVENOUS

## 2022-07-14 ENCOUNTER — Ambulatory Visit
Admission: RE | Admit: 2022-07-14 | Discharge: 2022-07-14 | Disposition: A | Discharge status: Home / Self Care | Payer: BC Managed Care – PPO | Source: Ambulatory Visit | Attending: Obstetrics & Gynecology | Admitting: Obstetrics & Gynecology

## 2022-07-14 DIAGNOSIS — Z1231 Encounter for screening mammogram for malignant neoplasm of breast: Secondary | ICD-10-CM

## 2022-07-14 LAB — HM MAMMOGRAPHY

## 2022-07-15 ENCOUNTER — Ambulatory Visit (INDEPENDENT_AMBULATORY_CARE_PROVIDER_SITE_OTHER): Payer: BC Managed Care – PPO

## 2022-07-15 ENCOUNTER — Other Ambulatory Visit (HOSPITAL_COMMUNITY): Payer: Self-pay

## 2022-07-15 ENCOUNTER — Ambulatory Visit: Payer: BC Managed Care – PPO | Admitting: Sports Medicine

## 2022-07-15 VITALS — BP 112/80 | HR 84 | Ht 64.0 in | Wt 201.0 lb

## 2022-07-15 DIAGNOSIS — M25562 Pain in left knee: Secondary | ICD-10-CM

## 2022-07-15 DIAGNOSIS — M25561 Pain in right knee: Secondary | ICD-10-CM

## 2022-07-15 DIAGNOSIS — G8929 Other chronic pain: Secondary | ICD-10-CM

## 2022-07-15 DIAGNOSIS — M1711 Unilateral primary osteoarthritis, right knee: Secondary | ICD-10-CM | POA: Diagnosis not present

## 2022-07-15 MED ORDER — MELOXICAM 15 MG PO TABS
15.0000 mg | ORAL_TABLET | Freq: Every day | ORAL | 0 refills | Status: DC
  Filled 2022-07-15: qty 30, 30d supply, fill #0

## 2022-07-15 NOTE — Progress Notes (Signed)
[name] [name] D.[name] [name] [name] [name] Phone: [phone]   Assessment and Plan:     1. Chronic pain of both knees -Chronic with exacerbation, initial sports medicine and - History of chronic knee pain with partial knee replacement right knee and recurrence of bilateral knee pain over the past several months since starting physical therapy - I suspect that physical therapy as well as other physical activities have flared bilateral knees causing altered gait function and compensation leading to pain and swelling - Start meloxicam 15 mg daily x2 weeks.  If still having pain after 2 weeks, complete 3rd-week of meloxicam. May use remaining meloxicam as needed once daily for pain control.  Do not to use additional NSAIDs while taking meloxicam.  May use Tylenol [phone] mg 2 to 3 times a day for breakthrough pain. - Start HEP for knees - X-rays obtained in clinic.  My interpretation: No acute fracture or dislocation.  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  Relatively unremarkable left knee x-ray - DG Knee AP/LAT W/[place] Right; Future - DG Knee AP/LAT W/[place] Left; Future    Pertinent previous records reviewed include none   Follow Up: 3 to 4 weeks for reevaluation.  Could consider CSI versus aspiration versus physical therapy   Subjective:   I, [name] [name], am serving as a Education administrator for Doctor Glennon Mac  Chief Complaint: bilateral knee pain   HPI:   07/15/22 Patient is a 57 year old female complaining of bilateral knee pain. Patient states saw Dr. Georgina Snell in January, was sent to PT , 3rd time at pt she had an inflamed right knee , has a partial knee replacement on the right, first week of march left knee was swollen and had a bunch of fluid on it ,  decreased ROM , no MOI, no numbness or tingling, tylenol for the pain, not so much pain but decreased ROM due to  swelling,no radiating pain, going up and down steps is hard she has to side step, antalgic gait, lot of stiffness with her R knee, stopped PT after first week of march   Relevant Historical Information: Hypertension, GERD  Additional pertinent review of systems negative.   Current Outpatient Medications:    atenolol (TENORMIN) 25 MG tablet, Take 1 tablet (25 mg total) by mouth daily., Disp: 90 tablet, Rfl: 3   gabapentin (NEURONTIN) 100 MG capsule, Take 1-3 capsules (100-300 mg total) by mouth at bedtime as needed (nerve pain)., Disp: 90 capsule, Rfl: 3   hydrochlorothiazide (HYDRODIURIL) 25 MG tablet, Take 1 tablet by mouth in the morning, Disp: 90 tablet, Rfl: 2   meloxicam (MOBIC) 15 MG tablet, Take 1 tablet (15 mg total) by mouth daily., Disp: 30 tablet, Rfl: 0   nystatin cream (MYCOSTATIN), Apply to the affected areas 2 times a day, Disp: 30 g, Rfl: 3   phentermine (ADIPEX-P) 37.5 MG tablet, Take 1 tablet (37.5 mg total) by mouth every morning., Disp: 30 tablet, Rfl: 2   potassium chloride SA (KLOR-CON M) 20 MEQ tablet, Take 1 tablet (20 mEq total) by mouth every other day., Disp: , Rfl:    triamcinolone cream (KENALOG) 0.1 %, Apply a thin layer to affected area 2 times a day, Disp: 30 g, Rfl: 3   zolpidem (AMBIEN) 5 MG tablet, Take 1 tablet (5 mg total) by mouth at bedtime as needed., Disp: 30 tablet, Rfl: 3   Objective:  Vitals:   07/15/22 1453  BP: 112/80  Pulse: 84  SpO2: 99%  Weight: 201 lb (91.2 kg)  Height: 5\' 4"  (1.626 m)      Body mass index is 34.5 kg/m.    Physical Exam:    General:  awake, alert oriented, no acute distress nontoxic Skin: no suspicious lesions or rashes Neuro:sensation intact and strength 5/5 with no deficits, no atrophy, normal muscle tone Psych: No signs of anxiety, depression or other mood disorder  Bilateral knee: Moderate left and mild right swelling No deformity Neg fluid wave, joint milking ROM Flex 100, Ext 5 TTP left posterior  fossa NTTP over the quad tendon, medial fem condyle, lat fem condyle, patella, plica, patella tendon, tibial tuberostiy, fibular head, right posterior fossa, pes anserine bursa, gerdy's tubercle, medial jt line, lateral jt line Neg anterior and posterior drawer Neg lachman Neg sag sign Negative varus stress Negative valgus stress Negative McMurray Negative Thessaly  Gait normal    Electronically signed by:  [name] [name] D.[facility] 3:44 PM 07/15/22

## 2022-07-15 NOTE — Patient Instructions (Addendum)
Good to see you  - Start meloxicam 15 mg daily x2 weeks.  If still having pain after 2 weeks, complete 3rd-week of meloxicam. May use remaining meloxicam as needed once daily for pain control.  Do not to use additional NSAIDs while taking meloxicam.  May use Tylenol 500-1000 mg 2 to 3 times a day for breakthrough pain. Knee HEP 3-4 week follow up  

## 2022-07-18 ENCOUNTER — Encounter: Payer: BC Managed Care – PPO | Admitting: Physical Therapy

## 2022-07-23 DIAGNOSIS — J0100 Acute maxillary sinusitis, unspecified: Secondary | ICD-10-CM | POA: Diagnosis not present

## 2022-07-28 ENCOUNTER — Other Ambulatory Visit (HOSPITAL_COMMUNITY): Payer: Self-pay

## 2022-07-28 NOTE — Progress Notes (Signed)
Aleen SellsBen Domani Bakos D.Kela Millin. CAQSM [facility] [address] [place] [id number] Phone: [phone]   Assessment and Plan:     1. Chronic pain of both knees 2. Status post right partial knee replacement 3. Effusion of bursa of right knee  -Chronic with exacerbation, subsequent visit - Overall significant improvement in left knee pain and swelling and mild improvement in right knee pain and swelling after completing 2-week course of meloxicam 15 mg daily - Recommend continuing and completing an additional 2-week course of meloxicam 15 mg daily and then discontinuing medication - After stopping meloxicam,Start Tylenol 500 to 1000 mg tablets 2-3 times a day for day-to-day pain relief -Continue HEP for bilateral knees - To decrease swelling in right knee, recommend RICE therapy - Discussed aspiration of right knee with patient at today's visit.  With patient having a history of partial knee replacement of right knee, I discussed the risks associated with aspiration which could include infection of hardware, so we agreed to not perform aspiration today.  Mild to moderate effusion on physical exam.  If conservative treatment does not reduce swelling, patient can follow-up with orthopedic surgeon, Dr. Valentina GuLucy, to discuss further options  Pertinent previous records reviewed include none   Follow Up: As needed if no improvement or worsening of symptoms.  Could consider left knee CSI versus physical therapy   Subjective:   I, Moenique Parris, am serving as a Neurosurgeonscribe for Doctor Richardean SaleBenjamin Noor Vidales   Chief Complaint: bilateral knee pain    HPI:    07/15/22 Patient is a 57 year old female complaining of bilateral knee pain. Patient states saw Dr. Denyse Amassorey in January, was sent to PT , 3rd time at pt she had an inflamed right knee , has a partial knee replacement on the right, first week of march left knee was swollen and had a bunch of fluid on it ,  decreased ROM , no MOI, no numbness  or tingling, tylenol for the pain, not so much pain but decreased ROM due to swelling,no radiating pain, going up and down steps is hard she has to side step, antalgic gait, lot of stiffness with her R knee, stopped PT after first week of march   07/29/2022 Patient states that meloxicam helped the left knee , she has more ROM on the left , right knee is still swollen , she wants to get knee drained potentially     Relevant Historical Information: Hypertension, GERD  Additional pertinent review of systems negative.   Current Outpatient Medications:    atenolol (TENORMIN) 25 MG tablet, Take 1 tablet (25 mg total) by mouth daily., Disp: 90 tablet, Rfl: 3   gabapentin (NEURONTIN) 100 MG capsule, Take 1-3 capsules (100-300 mg total) by mouth at bedtime as needed (nerve pain)., Disp: 90 capsule, Rfl: 3   hydrochlorothiazide (HYDRODIURIL) 25 MG tablet, Take 1 tablet by mouth in the morning, Disp: 90 tablet, Rfl: 2   meloxicam (MOBIC) 15 MG tablet, Take 1 tablet (15 mg total) by mouth daily., Disp: 30 tablet, Rfl: 0   nystatin cream (MYCOSTATIN), Apply to the affected areas 2 times a day, Disp: 30 g, Rfl: 3   phentermine (ADIPEX-P) 37.5 MG tablet, Take 1 tablet (37.5 mg total) by mouth every morning., Disp: 30 tablet, Rfl: 2   potassium chloride SA (KLOR-CON M) 20 MEQ tablet, Take 1 tablet (20 mEq total) by mouth every other day., Disp: , Rfl:    triamcinolone cream (KENALOG) 0.1 %, Apply a thin  layer to affected area 2 times a day, Disp: 30 g, Rfl: 3   zolpidem (AMBIEN) 5 MG tablet, Take 1 tablet (5 mg total) by mouth at bedtime as needed., Disp: 30 tablet, Rfl: 3   Objective:     Vitals:   07/29/22 1002  Pulse: 83  SpO2: 97%  Weight: 203 lb (92.1 kg)  Height: 5\' 4"  (1.626 m)      Body mass index is 34.84 kg/m.    Physical Exam:    General:  awake, alert oriented, no acute distress nontoxic Skin: no suspicious lesions or rashes Neuro:sensation intact and strength 5/5 with no deficits,  no atrophy, normal muscle tone Psych: No signs of anxiety, depression or other mood disorder  General:  awake, alert oriented, no acute distress nontoxic Skin: no suspicious lesions or rashes Neuro:sensation intact and strength 5/5 with no deficits, no atrophy, normal muscle tone Psych: No signs of anxiety, depression or other mood disorder   Bilateral knee:   mild right swelling No deformity Neg fluid wave, joint milking ROM Flex 100, Ext 5   NTTP over the quad tendon, medial fem condyle, lat fem condyle, patella, plica, patella tendon, tibial tuberostiy, fibular head, right posterior fossa, pes anserine bursa, gerdy's tubercle, medial jt line, lateral jt line Neg anterior and posterior drawer Neg lachman Neg sag sign Negative varus stress Negative valgus stress Negative McMurray Negative Thessaly   Gait normal     Electronically signed by:  Aleen SellsBen Vanshika Jastrzebski D.Kela Millin. CAQSM [facility] 10:52 AM 07/29/22

## 2022-07-29 ENCOUNTER — Ambulatory Visit: Payer: BC Managed Care – PPO | Admitting: Sports Medicine

## 2022-07-29 VITALS — HR 83 | Ht 64.0 in | Wt 203.0 lb

## 2022-07-29 DIAGNOSIS — Z96651 Presence of right artificial knee joint: Secondary | ICD-10-CM

## 2022-07-29 DIAGNOSIS — M25461 Effusion, right knee: Secondary | ICD-10-CM

## 2022-07-29 DIAGNOSIS — M25562 Pain in left knee: Secondary | ICD-10-CM | POA: Diagnosis not present

## 2022-07-29 DIAGNOSIS — M25561 Pain in right knee: Secondary | ICD-10-CM | POA: Diagnosis not present

## 2022-07-29 DIAGNOSIS — G8929 Other chronic pain: Secondary | ICD-10-CM

## 2022-07-29 NOTE — Patient Instructions (Addendum)
Good to see you  Complete course of meloxicam and then discontinue Recommend RICES rest ice compression and elevation to decrease swelling After completing meloxicam start Tylenol [phone] mg 2-3 times a day for pain relief  As needed follow up

## 2022-08-02 ENCOUNTER — Other Ambulatory Visit (HOSPITAL_COMMUNITY): Payer: Self-pay

## 2022-08-04 DIAGNOSIS — H524 Presbyopia: Secondary | ICD-10-CM | POA: Diagnosis not present

## 2022-08-04 DIAGNOSIS — H04123 Dry eye syndrome of bilateral lacrimal glands: Secondary | ICD-10-CM | POA: Diagnosis not present

## 2022-08-05 ENCOUNTER — Other Ambulatory Visit (HOSPITAL_COMMUNITY): Payer: Self-pay

## 2022-08-05 ENCOUNTER — Encounter: Payer: Self-pay | Admitting: Physician Assistant

## 2022-08-05 MED ORDER — ZOLPIDEM TARTRATE 5 MG PO TABS
5.0000 mg | ORAL_TABLET | Freq: Every evening | ORAL | 0 refills | Status: DC
  Filled 2022-08-05: qty 30, 30d supply, fill #0

## 2022-08-05 NOTE — Addendum Note (Signed)
Addended by: Jimmye Norman on: 08/05/2022 02:46 PM   Modules accepted: Orders

## 2022-08-05 NOTE — Telephone Encounter (Signed)
Dr. Ruthine Dose, can you please fill Rx for pt she is complete out. Thanks

## 2022-08-06 ENCOUNTER — Other Ambulatory Visit (HOSPITAL_COMMUNITY): Payer: Self-pay

## 2022-08-08 ENCOUNTER — Ambulatory Visit: Payer: BC Managed Care – PPO | Attending: Cardiovascular Disease | Admitting: Cardiovascular Disease

## 2022-08-08 ENCOUNTER — Encounter: Payer: Self-pay | Admitting: Cardiovascular Disease

## 2022-08-08 ENCOUNTER — Other Ambulatory Visit (HOSPITAL_COMMUNITY): Payer: Self-pay

## 2022-08-08 VITALS — BP 124/68 | HR 62 | Ht 64.0 in | Wt 203.4 lb

## 2022-08-08 DIAGNOSIS — I2510 Atherosclerotic heart disease of native coronary artery without angina pectoris: Secondary | ICD-10-CM | POA: Diagnosis not present

## 2022-08-08 DIAGNOSIS — I10 Essential (primary) hypertension: Secondary | ICD-10-CM | POA: Diagnosis not present

## 2022-08-08 MED ORDER — ASPIRIN 81 MG PO TBEC: 81.0000 mg | DELAYED_RELEASE_TABLET | Freq: Every day | ORAL | 3 refills | Status: AC

## 2022-08-08 MED ORDER — ROSUVASTATIN CALCIUM 10 MG PO TABS
10.0000 mg | ORAL_TABLET | Freq: Every day | ORAL | 3 refills | Status: DC
  Filled 2022-08-08: qty 90, 90d supply, fill #0

## 2022-08-08 MED ORDER — DILTIAZEM HCL ER COATED BEADS 120 MG PO CP24
120.0000 mg | ORAL_CAPSULE | Freq: Every day | ORAL | 3 refills | Status: DC
  Filled 2022-08-08: qty 90, 90d supply, fill #0

## 2022-08-08 NOTE — Patient Instructions (Signed)
Medication Instructions:  Your physician has recommended you make the following change in your medication:  1.) start aspirin 81 mg - one tablet daily 2.) start rosuvastatin (Crestor) 10 mg - one tablet daily 3.) stop atenolol 4.) start diltiazem (Cardizem) 120 mg - one tablet daily  *If you need a refill on your cardiac medications before your next appointment, please call your pharmacy*   Lab Work: Please return in 12 weeks for blood work (lipids/liver function)  If you have labs (blood work) drawn today and your tests are completely normal, you will receive your results only by: [employer] (if you have MyChart) OR A paper copy in the mail If you have any lab test that is abnormal or we need to change your treatment, we will call you to review the results.   Testing/Procedures: none   Follow-Up: At [facility], you and your health needs are our priority.  As part of our continuing mission to provide you with exceptional heart care, we have created designated Provider Care Teams.  These Care Teams include your primary Cardiologist (physician) and Advanced Practice Providers (APPs -  Physician Assistants and Nurse Practitioners) who all work together to provide you with the care you need, when you need it.    Your next appointment:   12 month(s)  Provider:   Verne Carrow, MD

## 2022-08-08 NOTE — Progress Notes (Signed)
Chief Complaint  Patient presents with   New Patient (Initial Visit)    CAD   History of Present Illness: 57 yo female with history of GERD, HTN and migraines who is here today as a new consult, referred by Jarold Motto, PA, for the evaluation of CAD/abnormal coronary calcium score. CT coronary calcium score of 64.7 on 06/29/22 (89th percentile for age). She tells me that she was under stress several months ago and was felt to have an anxiety. She has no exertional chest pain or dyspnea. She has mild dyspnea when climbing stairs.   I also take care of her husband Aryanna Matic.   Primary Care Physician: Jarold Motto, [place]   Past Medical History:  Diagnosis Date   Abnormal PFT    Arthritis    B12 deficiency    Chicken pox    GERD (gastroesophageal reflux disease)    High potassium    Hypertension    Liver lesion    Hepatic Adenoma   Migraines    Obesity    Vitamin D deficiency     Past Surgical History:  Procedure Laterality Date   KNEE ARTHROSCOPY Right 01/2017   MEDIAL PARTIAL KNEE REPLACEMENT     NASAL SINUS SURGERY  02/2019    Current Outpatient Medications  Medication Sig Dispense Refill   aspirin EC 81 MG tablet Take 1 tablet (81 mg total) by mouth daily. Swallow whole. 90 tablet 3   diltiazem (CARDIZEM CD) 120 MG 24 hr capsule Take 1 capsule (120 mg total) by mouth daily. 90 capsule 3   gabapentin (NEURONTIN) 100 MG capsule Take 1-3 capsules (100-300 mg total) by mouth at bedtime as needed (nerve pain). 90 capsule 3   hydrochlorothiazide (HYDRODIURIL) 25 MG tablet Take 1 tablet by mouth in the morning 90 tablet 2   meloxicam (MOBIC) 15 MG tablet Take 1 tablet (15 mg total) by mouth daily. 30 tablet 0   nystatin cream (MYCOSTATIN) Apply to the affected areas 2 times a day 30 g 3   phentermine (ADIPEX-P) 37.5 MG tablet Take 1 tablet (37.5 mg total) by mouth every morning. 30 tablet 2   rosuvastatin (CRESTOR) 10 MG tablet Take 1 tablet (10 mg total) by mouth  daily. 90 tablet 3   triamcinolone cream (KENALOG) 0.1 % Apply a thin layer to affected area 2 times a day 30 g 3   zolpidem (AMBIEN) 5 MG tablet Take 1 tablet (5 mg total) by mouth at bedtime as needed. 30 tablet 0   No current facility-administered medications for this visit.    Allergies  Allergen Reactions   Lisinopril-Hydrochlorothiazide Cough   Amlodipine Other (See Comments)    Lower extremity edema.    Social History   Socioeconomic History   Marital status: Married    Spouse name: Not on file   Number of children: 3   Years of education: Not on file   Highest education level: Not on file  Occupational History   Occupation: Corporate treasurer: [employer]   Occupation: Airline pilot  Tobacco Use   Smoking status: Never   Smokeless tobacco: Never  Vaping Use   Vaping Use: Never used  Substance and Sexual Activity   Alcohol use: Yes    Alcohol/week: 0.0 standard drinks of alcohol    Comment: occas   Drug use: No   Sexual activity: Not Currently  Other Topics Concern   Not on file  Social History Narrative   Not on file  Social Determinants of Health   Financial Resource Strain: Not on file  Food Insecurity: Not on file  Transportation Needs: Not on file  Physical Activity: Not on file  Stress: Not on file  Social Connections: Not on file  Intimate Partner Violence: Not on file    Family History  Problem Relation Age of Onset   Diabetes Mother    Hyperlipidemia Mother    Thyroid cancer Father    Diabetes Sister    Hyperlipidemia Sister    Cancer Maternal Grandmother    Diabetes Maternal Grandmother    Hyperlipidemia Maternal Grandmother    Hypertension Maternal Grandmother    Diabetes Maternal Grandfather    Hyperlipidemia Maternal Grandfather    Cancer Paternal Grandmother    Diabetes Paternal Grandmother    Heart disease Paternal Grandmother    Hyperlipidemia Paternal Grandmother    Hypertension Paternal Grandmother    Colon  cancer Neg Hx    Colon polyps Neg Hx    Rectal cancer Neg Hx    Stomach cancer Neg Hx    Breast cancer Neg Hx     Review of Systems:  As stated in the HPI and otherwise negative.   BP 124/68   Pulse 62   Ht  (1.626 m)   Wt 92.3 kg   LMP  (LMP Unknown)   SpO2 96%   BMI 34.91 kg/m   Physical Examination: General: Well developed, well nourished, NAD  HEENT: OP clear, mucus membranes moist  SKIN: warm, dry. No rashes. Neuro: No focal deficits  Musculoskeletal: Muscle strength 5/5 all ext  Psychiatric: Mood and affect normal  Neck: No JVD, no carotid bruits, no thyromegaly, no lymphadenopathy.  Lungs:Clear bilaterally, no wheezes, rhonci, crackles Cardiovascular: Regular rate and rhythm. No murmurs, gallops or rubs. Abdomen:Soft. Bowel sounds present. Non-tender.  Extremities: No lower extremity edema. Pulses are 2 + in the bilateral DP/PT.  EKG:  EKG is ordered today. The ekg ordered today demonstrates Sinus, Poor R wave progression  Recent Labs: 06/08/2022: Hemoglobin 13.8; Platelets 245.0; TSH 2.27 06/16/2022: ALT 36; BUN 20; Creatinine, Ser 0.84; Potassium 3.8; Sodium 140   Lipid Panel    Component Value Date/Time   CHOL 198 06/16/2022 1007   TRIG 138.0 06/16/2022 1007   HDL 58.50 06/16/2022 1007   CHOLHDL 3 06/16/2022 1007   VLDL 27.6 06/16/2022 1007   LDLCALC 112 (H) 06/16/2022 1007     Wt Readings from Last 3 Encounters:  08/08/22 92.3 kg  07/29/22 92.1 kg  07/15/22 91.2 kg    Assessment and Plan:   1. CAD without angina: Finding of abnormal coronary artery calcium score on recent test (64.7). No chest pain suggestive of angina. Will have her start ASA 81 mg daily and Crestor 10 mg daily. Will need to repeat lipids and LFTs in 12 weeks. (LDL 112 in February 2024).  2. HTN: BP is controlled but she feels that the atenolol is making her feel fatigued. Will stop atenolol and start Cardizem CD 120 mg daily. (She did not tolerate Lisinopril, Benicar,  Norvasc).    Labs/ tests ordered today include:   Orders Placed This Encounter  Procedures   Hepatic function panel   Lipid panel   EKG 12-Lead     Disposition:   F/U with me in one year    Signed, Verne Carrow, MD, [facility] 08/08/2022 12:03 PM    [facility] [address] [place], [place], [place]  [address] Phone: [phone]; Fax: [phone]

## 2022-08-09 DIAGNOSIS — N390 Urinary tract infection, site not specified: Secondary | ICD-10-CM | POA: Diagnosis not present

## 2022-08-09 DIAGNOSIS — J3489 Other specified disorders of nose and nasal sinuses: Secondary | ICD-10-CM | POA: Diagnosis not present

## 2022-08-16 ENCOUNTER — Other Ambulatory Visit (HOSPITAL_COMMUNITY): Payer: Self-pay

## 2022-09-05 ENCOUNTER — Other Ambulatory Visit: Payer: Self-pay | Admitting: Family Medicine

## 2022-09-05 MED ORDER — ZOLPIDEM TARTRATE 5 MG PO TABS
5.0000 mg | ORAL_TABLET | Freq: Every evening | ORAL | 0 refills | Status: DC
  Filled 2022-09-05: qty 30, 30d supply, fill #0

## 2022-09-06 ENCOUNTER — Other Ambulatory Visit (HOSPITAL_COMMUNITY): Payer: Self-pay

## 2022-09-07 NOTE — Progress Notes (Signed)
Subjective:    [name] [name] is a 57 y.o. female and is here for a comprehensive physical exam.  HPI  Health Maintenance Due  Topic Date Due   Zoster Vaccines- Shingrix (1 of 2) Never done   PAP SMEAR-Modifier  06/24/2022    Acute Concerns: None  Chronic Issues: Insomnia Treated with zolpidem 5 mg at bedtime as needed. This is helping symptoms without negative side effects.  Hyperlipidemia Treated with rosuvastatin 10 mg daily. Has not started this due to concerns that it will affect arthritis. Seen by Dr. Clifton James, cardiologist.  Hypertension Treated with hydrochlorothiazide 25 mg daily in the morning, atenolol 25 mg daily. Blood pressure normal today at 130/76.  Obesity Treated with phentermine 37.5 mg daily in the morning. This helps with appetite suppression but she has not noticed any significant weight loss. Does not think this affects her blood pressure.  Lower Abdominal Pain, Urinary Urgency Had an episode of lower abdominal pain and urinary urgency on the night of 08/24/22. Had some hematuria the morning after. Symptoms resolved on 08/25/22. Denies history of renal stones. Did a teledoc appointment and was told she likely had urinary tract infection and was treated with antibiotic(s)  B/l hand pain Had a fall in 02/2022. Injured heels of hands. Denies any overt swelling at time of fall. Denies swelling in hands.  Pain Bilateral Elbows This is a longstanding issue. Sleeps with arms bent. Denies numbness/tingling in hands.  Labs are pending. Requesting liver functions, B12, Vitamin D check. Believes she needs to drink more water and drink less sweet tea. Does not see a dermatologist regularly- denies new or concerning skin problems. Wears sunscreen. Denies weakness in arms, GI symptoms.   Health Maintenance: Immunizations -- UTD on flu, tetanus vaccines. Colonoscopy -- Last completed 05/08/15. Normal colonoscopy. Recommended repeat in  2027. Mammogram -- Last completed 07/14/22. No mammographic evidence of malignancy. Recommended repeat in 2025. PAP -- Last completed 06/24/19. Within normal limits. Recommended repeat in 2024. Bone Density -- N/A Diet -- Eats healthy. Exercise -- Limited activity due to arthritis.  Sleep habits -- Stable. Mood -- Stable.  UTD with dentist? - UTD UTD with eye doctor? - UTD  Weight history: Wt Readings from Last 10 Encounters:  09/14/22 202 lb 6.1 oz (91.8 kg)  08/08/22 203 lb 6.4 oz (92.3 kg)  07/29/22 203 lb (92.1 kg)  07/15/22 201 lb (91.2 kg)  06/16/22 201 lb 6.1 oz (91.3 kg)  06/08/22 203 lb 12.8 oz (92.4 kg)  04/27/22 200 lb (90.7 kg)  08/24/21 202 lb (91.6 kg)  06/22/21 204 lb 6.4 oz (92.7 kg)  09/29/20 191 lb 3.2 oz (86.7 kg)   Body mass index is 35.85 kg/m. No LMP recorded (lmp unknown). Patient is postmenopausal.  Alcohol use:  reports current alcohol use.  Tobacco use:  Tobacco Use: Low Risk  (09/14/2022)   Patient History    Smoking Tobacco Use: Never    Smokeless Tobacco Use: Never    Passive Exposure: Not on file   Eligible for lung cancer screening? No     06/16/2022    9:25 AM  Depression screen PHQ 2/9  Decreased Interest 0  Down, Depressed, Hopeless 0  PHQ - 2 Score 0     Other providers/specialists: Patient Care Team: Jarold Motto, [place] as PCP - General (Physician Assistant) Kathleene Hazel, MD as PCP - Cardiology (Cardiology)    PMHx, SurgHx, SocialHx, Medications, and Allergies were reviewed in the Visit Navigator and updated as  appropriate.   Past Medical History:  Diagnosis Date   Abnormal PFT    Arthritis    B12 deficiency    Chicken pox    GERD (gastroesophageal reflux disease)    High potassium    Hypertension    Liver lesion    Hepatic Adenoma   Migraines    Obesity    Vitamin D deficiency      Past Surgical History:  Procedure Laterality Date   KNEE ARTHROSCOPY Right 01/2017   MEDIAL PARTIAL KNEE  REPLACEMENT     NASAL SINUS SURGERY  02/2019     Family History  Problem Relation Age of Onset   Diabetes Mother    Hyperlipidemia Mother    Thyroid cancer Father    Diabetes Sister    Hyperlipidemia Sister    Cancer Maternal Grandmother    Diabetes Maternal Grandmother    Hyperlipidemia Maternal Grandmother    Hypertension Maternal Grandmother    Diabetes Maternal Grandfather    Hyperlipidemia Maternal Grandfather    Cancer Paternal Grandmother    Diabetes Paternal Grandmother    Heart disease Paternal Grandmother    Hyperlipidemia Paternal Grandmother    Hypertension Paternal Grandmother    Colon cancer Neg Hx    Colon polyps Neg Hx    Rectal cancer Neg Hx    Stomach cancer Neg Hx    Breast cancer Neg Hx     Social History   Tobacco Use   Smoking status: Never   Smokeless tobacco: Never  Vaping Use   Vaping Use: Never used  Substance Use Topics   Alcohol use: Yes    Alcohol/week: 0.0 standard drinks of alcohol    Comment: occas   Drug use: No    Review of Systems:   Review of Systems  Constitutional:  Negative for chills, fever, malaise/fatigue and weight loss.  HENT:  Negative for hearing loss, sinus pain and sore throat.   Respiratory:  Negative for cough, hemoptysis and shortness of breath.   Cardiovascular:  Negative for chest pain, palpitations, leg swelling and PND.  Gastrointestinal:  Negative for abdominal pain, constipation, diarrhea, heartburn, nausea and vomiting.  Genitourinary:  Negative for dysuria, flank pain, frequency, hematuria and urgency.  Musculoskeletal:  Positive for joint pain (Knees, elbows). Negative for back pain, myalgias and neck pain.  Skin:  Negative for itching and rash.  Neurological:  Negative for dizziness, tingling, seizures, weakness and headaches.  Endo/Heme/Allergies:  Negative for polydipsia.  Psychiatric/Behavioral:  Negative for depression. The patient is not nervous/anxious.     Objective:   BP 130/76 (BP  Location: Left Arm, Patient Position: Sitting, Cuff Size: Large)   Pulse 64   Temp (!) 97.3 F (36.3 C) (Temporal)   Ht 5\' 3"  (1.6 m)   Wt 202 lb 6.1 oz (91.8 kg)   LMP  (LMP Unknown)   SpO2 95%   BMI 35.85 kg/m  Body mass index is 35.85 kg/m.   General Appearance:    Alert, cooperative, no distress, appears stated age  Head:    Normocephalic, without obvious abnormality, atraumatic  Eyes:    PERRL, conjunctiva/corneas clear, EOM's intact, fundi    benign, both eyes  Ears:    Normal TM's and external ear canals, both ears  Nose:   Nares normal, septum midline, mucosa normal, no drainage    or sinus tenderness  Throat:   Lips, mucosa, and tongue normal; teeth and gums normal  Neck:   Supple, symmetrical, trachea midline, no adenopathy;  thyroid:  no enlargement/tenderness/nodules; no carotid   bruit or JVD  Back:     Symmetric, no curvature, ROM normal, no CVA tenderness  Lungs:     Clear to auscultation bilaterally, respirations unlabored  Chest Wall:    No tenderness or deformity   Heart:    Regular rate and rhythm, S1 and S2 normal, no murmur, rub or gallop  Breast Exam:    Deferred   Abdomen:     Soft, non-tender, bowel sounds active all four quadrants,    no masses, no organomegaly  Genitalia:    Deferred   Extremities:   Extremities normal, atraumatic, no cyanosis or edema  Pulses:   2+ and symmetric all extremities  Skin:   Skin color, texture, turgor normal, no rashes or lesions  Lymph nodes:   Cervical, supraclavicular, and axillary nodes normal  Neurologic:   CNII-XII intact, normal strength, sensation and reflexes    throughout    Assessment/Plan:   Routine physical examination Today patient counseled on age appropriate routine health concerns for screening and prevention, each reviewed and up to date or declined. Immunizations reviewed and up to date or declined. Labs ordered and reviewed. Risk factors for depression reviewed and negative. Hearing function and  visual acuity are intact. ADLs screened and addressed as needed. Functional ability and level of safety reviewed and appropriate. Education, counseling and referrals performed based on assessed risks today. Patient provided with a copy of personalized plan for preventive services.  Hyperlipidemia, unspecified hyperlipidemia type Update lipid panel per patient request She is statin-hesitant due to side effects  Primary hypertension Normotensive Continue hydrochloroTHIAZIDE 25 mg daily and atenolol 25 mg daily Management per cards  Hematuria, unspecified type Resolved but will recheck UA today to ensure resolution If persistent, will refer to urology  B12 deficiency Update B12 and provide recommendations accordingly  Primary insomnia Well controlled with as needed ativan Follow-up in 6 months, sooner if concerns  Pain of both elbows Unclear etiology Possibly related to sleeping position Handout on exercises for medial epicondylitis provided If no improvement or worsening, will refer to sports medicine  Obesity Ongoing Management per obstetrics Encouraged regular exercise  Bilateral hand pain No red flags Offered xray due to history of trauma but she declined Continue to monitor - if worsens, refer to sports medicine vs xray   I,Alexander Ruley,acting as a scribe for [employer], PA.,have documented all relevant documentation on the behalf of Jarold Motto, PA,as directed by  Jarold Motto, PA while in the presence of Jarold Motto, [place].   I, Jarold Motto, [place], have reviewed all documentation for this visit. The documentation on 09/14/22 for the exam, diagnosis, procedures, and orders are all accurate and complete.    Jarold Motto, PA-C [facility]

## 2022-09-14 ENCOUNTER — Encounter: Payer: Self-pay | Admitting: Physician Assistant

## 2022-09-14 ENCOUNTER — Ambulatory Visit (INDEPENDENT_AMBULATORY_CARE_PROVIDER_SITE_OTHER): Payer: BC Managed Care – PPO | Admitting: Physician Assistant

## 2022-09-14 ENCOUNTER — Other Ambulatory Visit: Payer: Self-pay | Admitting: Physician Assistant

## 2022-09-14 VITALS — BP 130/76 | HR 64 | Temp 97.3°F | Ht 63.0 in | Wt 202.4 lb

## 2022-09-14 DIAGNOSIS — M25522 Pain in left elbow: Secondary | ICD-10-CM

## 2022-09-14 DIAGNOSIS — E785 Hyperlipidemia, unspecified: Secondary | ICD-10-CM | POA: Diagnosis not present

## 2022-09-14 DIAGNOSIS — I10 Essential (primary) hypertension: Secondary | ICD-10-CM | POA: Diagnosis not present

## 2022-09-14 DIAGNOSIS — F5101 Primary insomnia: Secondary | ICD-10-CM

## 2022-09-14 DIAGNOSIS — E669 Obesity, unspecified: Secondary | ICD-10-CM

## 2022-09-14 DIAGNOSIS — R319 Hematuria, unspecified: Secondary | ICD-10-CM

## 2022-09-14 DIAGNOSIS — R7989 Other specified abnormal findings of blood chemistry: Secondary | ICD-10-CM

## 2022-09-14 DIAGNOSIS — M25521 Pain in right elbow: Secondary | ICD-10-CM

## 2022-09-14 DIAGNOSIS — E538 Deficiency of other specified B group vitamins: Secondary | ICD-10-CM | POA: Diagnosis not present

## 2022-09-14 DIAGNOSIS — M79642 Pain in left hand: Secondary | ICD-10-CM

## 2022-09-14 DIAGNOSIS — Z0000 Encounter for general adult medical examination without abnormal findings: Secondary | ICD-10-CM | POA: Diagnosis not present

## 2022-09-14 DIAGNOSIS — M79641 Pain in right hand: Secondary | ICD-10-CM

## 2022-09-14 LAB — COMPREHENSIVE METABOLIC PANEL
ALT: 49 U/L — ABNORMAL HIGH (ref 0–35)
AST: 40 U/L — ABNORMAL HIGH (ref 0–37)
Albumin: 4.1 g/dL (ref 3.5–5.2)
Alkaline Phosphatase: 70 U/L (ref 39–117)
BUN: 18 mg/dL (ref 6–23)
CO2: 26 mEq/L (ref 19–32)
Calcium: 9.7 mg/dL (ref 8.4–10.5)
Chloride: 101 mEq/L (ref 96–112)
Creatinine, Ser: 0.81 mg/dL (ref 0.40–1.20)
GFR: 80.05 mL/min (ref >60.00)
Glucose, Bld: 98 mg/dL (ref 70–99)
Potassium: 3.9 mEq/L (ref 3.5–5.1)
Sodium: 138 mEq/L (ref 135–145)
Total Bilirubin: 1 mg/dL (ref 0.2–1.2)
Total Protein: 7.1 g/dL (ref 6.0–8.3)

## 2022-09-14 LAB — VITAMIN B12: Vitamin B-12: 357 pg/mL (ref 211–911)

## 2022-09-14 LAB — CBC WITH DIFFERENTIAL/PLATELET
Basophils Absolute: 0.1 10*3/uL (ref 0.0–0.1)
Basophils Relative: 0.9 % (ref 0.0–3.0)
Eosinophils Absolute: 0.1 10*3/uL (ref 0.0–0.7)
Eosinophils Relative: 2.5 % (ref 0.0–5.0)
HCT: 39.9 % (ref 36.0–46.0)
Hemoglobin: 13.3 g/dL (ref 12.0–15.0)
Lymphocytes Relative: 34.6 % (ref 12.0–46.0)
Lymphs Abs: 2 10*3/uL (ref 0.7–4.0)
MCHC: 33.2 g/dL (ref 30.0–36.0)
MCV: 90.5 fl (ref 78.0–100.0)
Monocytes Absolute: 0.5 10*3/uL (ref 0.1–1.0)
Monocytes Relative: 8.9 % (ref 3.0–12.0)
Neutro Abs: 3.1 10*3/uL (ref 1.4–7.7)
Neutrophils Relative %: 53.1 % (ref 43.0–77.0)
Platelets: 229 10*3/uL (ref 150.0–400.0)
RBC: 4.41 Mil/uL (ref 3.87–5.11)
RDW: 13 % (ref 11.5–15.5)
WBC: 5.8 10*3/uL (ref 4.0–10.5)

## 2022-09-14 LAB — URINALYSIS, ROUTINE W REFLEX MICROSCOPIC
Bilirubin Urine: NEGATIVE
Ketones, ur: NEGATIVE
Leukocytes,Ua: NEGATIVE
Nitrite: NEGATIVE
Specific Gravity, Urine: 1.02 (ref 1.000–1.030)
Total Protein, Urine: NEGATIVE
Urine Glucose: NEGATIVE
Urobilinogen, UA: 0.2 (ref 0.0–1.0)
pH: 6.5 (ref 5.0–8.0)

## 2022-09-14 LAB — LIPID PANEL
Cholesterol: 216 mg/dL — ABNORMAL HIGH (ref 0–200)
HDL: 60.1 mg/dL (ref >39.00)
LDL Cholesterol: 134 mg/dL — ABNORMAL HIGH (ref 0–99)
NonHDL: 155.71
Total CHOL/HDL Ratio: 4
Triglycerides: 108 mg/dL (ref 0.0–149.0)
VLDL: 21.6 mg/dL (ref 0.0–40.0)

## 2022-09-14 NOTE — Patient Instructions (Addendum)
It was great to see you!  Try the exercises for your elbows and let me know how this goes.  Please go to the lab for blood work.   Our office will call you with your results unless you have chosen to receive results via MyChart.  If your blood work is normal we will follow-up each year for physicals and as scheduled for chronic medical problems.  If anything is abnormal we will treat accordingly and get you in for a follow-up.  Take care,  Lelon Mast

## 2022-09-21 ENCOUNTER — Other Ambulatory Visit: Payer: Self-pay

## 2022-09-21 ENCOUNTER — Other Ambulatory Visit (HOSPITAL_COMMUNITY): Payer: Self-pay

## 2022-10-03 ENCOUNTER — Other Ambulatory Visit: Payer: Self-pay | Admitting: Physician Assistant

## 2022-10-03 ENCOUNTER — Other Ambulatory Visit (HOSPITAL_COMMUNITY): Payer: Self-pay

## 2022-10-03 NOTE — Telephone Encounter (Signed)
Pt requesting refill for Zolpidem 5 mg. Last OV 09/14/2022.

## 2022-10-04 ENCOUNTER — Other Ambulatory Visit (HOSPITAL_COMMUNITY): Payer: Self-pay

## 2022-10-04 MED ORDER — ZOLPIDEM TARTRATE 5 MG PO TABS
5.0000 mg | ORAL_TABLET | Freq: Every evening | ORAL | 0 refills | Status: DC
  Filled 2022-10-04: qty 30, 30d supply, fill #0

## 2022-10-05 ENCOUNTER — Other Ambulatory Visit (HOSPITAL_COMMUNITY): Payer: Self-pay

## 2022-10-05 ENCOUNTER — Encounter: Payer: Self-pay | Admitting: Cardiovascular Disease

## 2022-10-05 MED ORDER — ATENOLOL 25 MG PO TABS
25.0000 mg | ORAL_TABLET | Freq: Every day | ORAL | 3 refills | Status: DC
  Filled 2022-10-05: qty 90, 90d supply, fill #0
  Filled 2023-01-03: qty 90, 90d supply, fill #1
  Filled 2023-03-30: qty 90, 90d supply, fill #2
  Filled 2023-06-29: qty 90, 90d supply, fill #3

## 2022-10-10 ENCOUNTER — Other Ambulatory Visit (HOSPITAL_COMMUNITY): Payer: Self-pay

## 2022-11-02 ENCOUNTER — Other Ambulatory Visit: Payer: Self-pay | Admitting: Physician Assistant

## 2022-11-02 ENCOUNTER — Other Ambulatory Visit (HOSPITAL_COMMUNITY): Payer: Self-pay

## 2022-11-02 MED ORDER — ZOLPIDEM TARTRATE 5 MG PO TABS
5.0000 mg | ORAL_TABLET | Freq: Every evening | ORAL | 0 refills | Status: DC
  Filled 2022-11-02: qty 90, 90d supply, fill #0

## 2022-11-02 NOTE — Telephone Encounter (Signed)
Pt requesting refill for Zolpidem. Last OV 09/14/2022.

## 2022-11-03 ENCOUNTER — Other Ambulatory Visit: Payer: BC Managed Care – PPO

## 2022-11-09 ENCOUNTER — Other Ambulatory Visit: Payer: Self-pay

## 2022-11-09 ENCOUNTER — Other Ambulatory Visit (HOSPITAL_COMMUNITY): Payer: Self-pay

## 2022-12-01 ENCOUNTER — Other Ambulatory Visit (HOSPITAL_COMMUNITY): Payer: Self-pay

## 2023-02-03 ENCOUNTER — Other Ambulatory Visit: Payer: Self-pay | Admitting: Physician Assistant

## 2023-02-03 ENCOUNTER — Other Ambulatory Visit (HOSPITAL_COMMUNITY): Payer: Self-pay

## 2023-02-06 ENCOUNTER — Other Ambulatory Visit (HOSPITAL_COMMUNITY): Payer: Self-pay

## 2023-02-06 MED ORDER — ZOLPIDEM TARTRATE 5 MG PO TABS
5.0000 mg | ORAL_TABLET | Freq: Every evening | ORAL | 0 refills | Status: DC
  Filled 2023-02-06: qty 30, 30d supply, fill #0

## 2023-03-07 ENCOUNTER — Other Ambulatory Visit: Payer: Self-pay | Admitting: Physician Assistant

## 2023-03-07 NOTE — Telephone Encounter (Signed)
Pt requesting refill for Zolpidem 5 mg. Last OV 09/14/2022.

## 2023-03-10 ENCOUNTER — Other Ambulatory Visit (HOSPITAL_COMMUNITY): Payer: Self-pay

## 2023-03-13 ENCOUNTER — Other Ambulatory Visit: Payer: Self-pay

## 2023-03-13 NOTE — Telephone Encounter (Signed)
Pt called again and would like a call back about refill for the bottom message.

## 2023-03-14 ENCOUNTER — Other Ambulatory Visit (HOSPITAL_COMMUNITY): Payer: Self-pay

## 2023-03-14 ENCOUNTER — Ambulatory Visit: Payer: BC Managed Care – PPO | Admitting: Physician Assistant

## 2023-03-14 ENCOUNTER — Encounter: Payer: Self-pay | Admitting: Physician Assistant

## 2023-03-14 VITALS — BP 120/72 | HR 66 | Temp 97.8°F | Ht 63.0 in | Wt 203.2 lb

## 2023-03-14 DIAGNOSIS — E669 Obesity, unspecified: Secondary | ICD-10-CM

## 2023-03-14 DIAGNOSIS — I10 Essential (primary) hypertension: Secondary | ICD-10-CM

## 2023-03-14 DIAGNOSIS — Z23 Encounter for immunization: Secondary | ICD-10-CM | POA: Diagnosis not present

## 2023-03-14 DIAGNOSIS — R7989 Other specified abnormal findings of blood chemistry: Secondary | ICD-10-CM | POA: Diagnosis not present

## 2023-03-14 DIAGNOSIS — F5101 Primary insomnia: Secondary | ICD-10-CM | POA: Diagnosis not present

## 2023-03-14 DIAGNOSIS — Z6836 Body mass index (BMI) 36.0-36.9, adult: Secondary | ICD-10-CM

## 2023-03-14 DIAGNOSIS — E785 Hyperlipidemia, unspecified: Secondary | ICD-10-CM

## 2023-03-14 MED ORDER — ZOLPIDEM TARTRATE 5 MG PO TABS
5.0000 mg | ORAL_TABLET | Freq: Every evening | ORAL | 1 refills | Status: DC
  Filled 2023-03-14: qty 90, 90d supply, fill #0
  Filled 2023-06-14: qty 90, 90d supply, fill #1

## 2023-03-14 MED ORDER — PHENTERMINE HCL 37.5 MG PO TABS
37.5000 mg | ORAL_TABLET | Freq: Every day | ORAL | 2 refills | Status: DC
  Filled 2023-03-14: qty 30, 30d supply, fill #0
  Filled 2023-05-09 – 2023-05-12 (×2): qty 30, 30d supply, fill #1
  Filled 2023-06-29: qty 30, 30d supply, fill #2

## 2023-03-14 NOTE — Telephone Encounter (Signed)
Please call pt and let her know she needs an appt per [facility] for refill of medication.

## 2023-03-14 NOTE — Progress Notes (Signed)
[name] [name] is a 57 y.o. female here for a follow-up of a pre-existing problem.  History of Present Illness:   Chief Complaint  Patient presents with   Insomnia   HPI  Insomnia: Managed with 5 mg Ambien nightly. States that if she doesn't take Ambien she will wake up in the middle of the night.  Reports that she ran out last Thursday and has been using Benadryl.  Hypertension: Managed/Compliant with 25 mg Atenolol and 25 mg Hydrochlorothiazide daily.  Denies excessive caffeine intake, stimulant usage, excessive alcohol intake, or increase in salt consumption.  Denies chest pain, shortness of breath, blurred vision, dizziness, unusual headaches, or lower leg swelling.  BP Readings from Last 3 Encounters:  03/14/23 120/72  09/14/22 130/76  08/08/22 124/68   Hyperlipidemia: Would like to have fasting labs to measure her lipids.  Lab Results  Component Value Date   CHOL 216 (H) 09/14/2022   HDL 60.10 09/14/2022   LDLCALC 134 (H) 09/14/2022   TRIG 108.0 09/14/2022   CHOLHDL 4 09/14/2022   Fhx of Diabetes: Would like her A1c checked. Lab Results  Component Value Date   HGBA1C 5.9 06/16/2022   Obesity: Previously managed with 37.5 mg Phentermine daily. Reports that she lost 10 LBS over the summer, but hasn't refilled since running out.  Notes that she's also been dealing with some stress. States that she didn't have any severe side effects while on Phentermine Discussed trying injectable semaglutides. Wt Readings from Last 3 Encounters:  03/14/23 203 lb 4 oz (92.2 kg)  09/14/22 202 lb 6.1 oz (91.8 kg)  08/08/22 203 lb 6.4 oz (92.3 kg)   Past Medical History:  Diagnosis Date   Abnormal PFT    Arthritis    B12 deficiency    Chicken pox    GERD (gastroesophageal reflux disease)    High potassium    Hypertension    Liver lesion    Hepatic Adenoma   Migraines    Obesity    Vitamin D deficiency     Social History   Tobacco Use   Smoking status: Never    Smokeless tobacco: Never  Vaping Use   Vaping status: Never Used  Substance Use Topics   Alcohol use: Yes    Alcohol/week: 0.0 standard drinks of alcohol    Comment: occas   Drug use: No   Past Surgical History:  Procedure Laterality Date   KNEE ARTHROSCOPY Right 01/2017   MEDIAL PARTIAL KNEE REPLACEMENT     NASAL SINUS SURGERY  02/2019   Family History  Problem Relation Age of Onset   Diabetes Mother        late-age onset   Hyperlipidemia Mother    Breast cancer Mother    Throat cancer Father    Diabetes Sister    Hyperlipidemia Sister    Luiz Blare' disease Sister    Stroke Brother    Breast cancer Maternal Grandmother    Diabetes Maternal Grandmother    Hyperlipidemia Maternal Grandmother    Hypertension Maternal Grandmother    Dementia Maternal Grandmother    Diabetes Maternal Grandfather    Hyperlipidemia Maternal Grandfather    Breast cancer Paternal Grandmother 30   Diabetes Paternal Grandmother    Heart disease Paternal Grandmother    Hyperlipidemia Paternal Grandmother    Hypertension Paternal Grandmother    Colon cancer Neg Hx    Colon polyps Neg Hx    Rectal cancer Neg Hx    Stomach cancer Neg Hx  Allergies  Allergen Reactions   Lisinopril-Hydrochlorothiazide Cough   Amlodipine Other (See Comments)    Lower extremity edema.   Current Medications:   Current Outpatient Medications:    aspirin EC 81 MG tablet, Take 1 tablet (81 mg total) by mouth daily. Swallow whole., Disp: 90 tablet, Rfl: 3   atenolol (TENORMIN) 25 MG tablet, Take 1 tablet (25 mg total) by mouth daily., Disp: 90 tablet, Rfl: 3   hydrochlorothiazide (HYDRODIURIL) 25 MG tablet, Take 1 tablet by mouth in the morning, Disp: 90 tablet, Rfl: 2   nystatin cream (MYCOSTATIN), Apply to the affected areas 2 times a day, Disp: 30 g, Rfl: 3   phentermine (ADIPEX-P) 37.5 MG tablet, Take 1 tablet (37.5 mg total) by mouth daily before breakfast., Disp: 30 tablet, Rfl: 2   triamcinolone cream  (KENALOG) 0.1 %, Apply a thin layer to affected area 2 times a day, Disp: 30 g, Rfl: 3   zolpidem (AMBIEN) 5 MG tablet, Take 1 tablet (5 mg total) by mouth at bedtime as needed., Disp: 90 tablet, Rfl: 1  Review of Systems:   ROS See pertinent positives and negatives as per the HPI.  Vitals:   Vitals:   03/14/23 1510  BP: 120/72  Pulse: 66  Temp: 97.8 F (36.6 C)  TempSrc: Temporal  SpO2: 96%  Weight: 203 lb 4 oz (92.2 kg)  Height: 5\' 3"  (1.6 m)     Body mass index is 36 kg/m.  Physical Exam:   Physical Exam Vitals and nursing note reviewed.  Constitutional:      General: She is not in acute distress.    Appearance: She is well-developed. She is not ill-appearing or toxic-appearing.  Cardiovascular:     Rate and Rhythm: Normal rate and regular rhythm.     Pulses: Normal pulses.     Heart sounds: Normal heart sounds, S1 normal and S2 normal.  Pulmonary:     Effort: Pulmonary effort is normal.     Breath sounds: Normal breath sounds.  Skin:    General: Skin is warm and dry.  Neurological:     Mental Status: She is alert.     GCS: GCS eye subscore is 4. GCS verbal subscore is 5. GCS motor subscore is 6.  Psychiatric:        Speech: Speech normal.        Behavior: Behavior normal. Behavior is cooperative.     Assessment and Plan:   Elevated LFTs Update LFTs Does have history of fatty liver found on abdominal ultrasound in March 2021 -- if labs remain elevated, will refer to gastroenterology for further evaluation  Hyperlipidemia, unspecified hyperlipidemia type Update lipid panel and make recommendations accordingly  Essential hypertension Normotensive Continue  25 mg Atenolol and 25 mg Hydrochlorothiazide daily. Follow-up in 6 month(s), sooner if concerns  Primary insomnia Stable Refill Ambien 5 mg daily Denies concerns with medication or falls/side effect(s) PDMP reviewed during this encounter. Follow-up in 6 month(s), sooner if concerns  Obesity,  unspecified class, unspecified obesity type, unspecified whether serious comorbidity present Currently not interested in GLP-1 Will update Hemoglobin A1c to assess for insulin resistance given family history Will start phentermine -- she typically takes 1/2 tablet of this daily  Reviewed risks and side effect(s) of medication If any chest pain, elevated blood pressure, or other concerns, stop medication and follow-up with [place] Continue efforts at healthy lifestyle  Need for immunization against influenza Completed today  I,Emily Lagle,acting as a scribe for [employer], PA.,have  documented all relevant documentation on the behalf of Jarold Motto, PA,as directed by  Jarold Motto, PA while in the presence of Jarold Motto, [place].  I, Jarold Motto, [place], have reviewed all documentation for this visit. The documentation on 03/14/23 for the exam, diagnosis, procedures, and orders are all accurate and complete.  Jarold Motto, PA-C

## 2023-03-14 NOTE — Patient Instructions (Signed)
It was great to see you!  Please make an appointment with the lab on your way out. I would like for you to return for lab work within 1-2 weeks. After midnight on the day of the lab draw, please do not eat anything. You may have water, black coffee, unsweetened tea.  Let's follow-up in 6 months for your Comprehensive Physical Exam (CPE) preventive care annual visit, sooner if you have concerns.  Take care,  Jarold Motto PA-C

## 2023-03-17 ENCOUNTER — Other Ambulatory Visit (INDEPENDENT_AMBULATORY_CARE_PROVIDER_SITE_OTHER): Payer: BC Managed Care – PPO

## 2023-03-17 DIAGNOSIS — R7989 Other specified abnormal findings of blood chemistry: Secondary | ICD-10-CM

## 2023-03-17 DIAGNOSIS — R319 Hematuria, unspecified: Secondary | ICD-10-CM | POA: Diagnosis not present

## 2023-03-17 DIAGNOSIS — E785 Hyperlipidemia, unspecified: Secondary | ICD-10-CM | POA: Diagnosis not present

## 2023-03-17 LAB — CBC WITH DIFFERENTIAL/PLATELET
Basophils Absolute: 0 10*3/uL (ref 0.0–0.1)
Basophils Relative: 0.7 % (ref 0.0–3.0)
Eosinophils Absolute: 0.2 10*3/uL (ref 0.0–0.7)
Eosinophils Relative: 3.7 % (ref 0.0–5.0)
HCT: 39.2 % (ref 36.0–46.0)
Hemoglobin: 13.1 g/dL (ref 12.0–15.0)
Lymphocytes Relative: 37.9 % (ref 12.0–46.0)
Lymphs Abs: 1.9 10*3/uL (ref 0.7–4.0)
MCHC: 33.3 g/dL (ref 30.0–36.0)
MCV: 91 fL (ref 78.0–100.0)
Monocytes Absolute: 0.4 10*3/uL (ref 0.1–1.0)
Monocytes Relative: 8.5 % (ref 3.0–12.0)
Neutro Abs: 2.4 10*3/uL (ref 1.4–7.7)
Neutrophils Relative %: 49.2 % (ref 43.0–77.0)
Platelets: 213 10*3/uL (ref 150.0–400.0)
RBC: 4.31 Mil/uL (ref 3.87–5.11)
RDW: 12.4 % (ref 11.5–15.5)
WBC: 5 10*3/uL (ref 4.0–10.5)

## 2023-03-17 LAB — HEPATIC FUNCTION PANEL
ALT: 47 U/L — ABNORMAL HIGH (ref 0–35)
AST: 39 U/L — ABNORMAL HIGH (ref 0–37)
Albumin: 4.1 g/dL (ref 3.5–5.2)
Alkaline Phosphatase: 69 U/L (ref 39–117)
Bilirubin, Direct: 0.1 mg/dL (ref 0.0–0.3)
Total Bilirubin: 0.8 mg/dL (ref 0.2–1.2)
Total Protein: 7.1 g/dL (ref 6.0–8.3)

## 2023-03-17 LAB — URINALYSIS, ROUTINE W REFLEX MICROSCOPIC
Bilirubin Urine: NEGATIVE
Ketones, ur: NEGATIVE
Leukocytes,Ua: NEGATIVE
Nitrite: NEGATIVE
Specific Gravity, Urine: 1.015 (ref 1.000–1.030)
Total Protein, Urine: NEGATIVE
Urine Glucose: NEGATIVE
Urobilinogen, UA: 0.2 (ref 0.0–1.0)
pH: 6.5 (ref 5.0–8.0)

## 2023-03-17 LAB — LIPID PANEL
Cholesterol: 174 mg/dL (ref 0–200)
HDL: 52 mg/dL (ref >39.00)
LDL Cholesterol: 101 mg/dL — ABNORMAL HIGH (ref 0–99)
NonHDL: 122.47
Total CHOL/HDL Ratio: 3
Triglycerides: 107 mg/dL (ref 0.0–149.0)
VLDL: 21.4 mg/dL (ref 0.0–40.0)

## 2023-03-17 LAB — COMPREHENSIVE METABOLIC PANEL
ALT: 47 U/L — ABNORMAL HIGH (ref 0–35)
AST: 39 U/L — ABNORMAL HIGH (ref 0–37)
Albumin: 4.1 g/dL (ref 3.5–5.2)
Alkaline Phosphatase: 69 U/L (ref 39–117)
BUN: 18 mg/dL (ref 6–23)
CO2: 28 meq/L (ref 19–32)
Calcium: 9.2 mg/dL (ref 8.4–10.5)
Chloride: 101 meq/L (ref 96–112)
Creatinine, Ser: 0.81 mg/dL (ref 0.40–1.20)
GFR: 79.77 mL/min (ref >60.00)
Glucose, Bld: 97 mg/dL (ref 70–99)
Potassium: 3.3 meq/L — ABNORMAL LOW (ref 3.5–5.1)
Sodium: 137 meq/L (ref 135–145)
Total Bilirubin: 0.8 mg/dL (ref 0.2–1.2)
Total Protein: 7.1 g/dL (ref 6.0–8.3)

## 2023-03-17 LAB — HEMOGLOBIN A1C: Hgb A1c MFr Bld: 5.9 % (ref 4.6–6.5)

## 2023-03-20 ENCOUNTER — Other Ambulatory Visit: Payer: Self-pay | Admitting: *Deleted

## 2023-03-20 DIAGNOSIS — R319 Hematuria, unspecified: Secondary | ICD-10-CM

## 2023-03-30 ENCOUNTER — Other Ambulatory Visit (HOSPITAL_COMMUNITY): Payer: Self-pay

## 2023-03-30 ENCOUNTER — Other Ambulatory Visit: Payer: Self-pay | Admitting: Physician Assistant

## 2023-03-30 MED ORDER — HYDROCHLOROTHIAZIDE 25 MG PO TABS
ORAL_TABLET | ORAL | 2 refills | Status: DC
  Filled 2023-03-30: qty 90, 90d supply, fill #0
  Filled 2023-06-29: qty 90, 90d supply, fill #1

## 2023-04-13 DIAGNOSIS — R310 Gross hematuria: Secondary | ICD-10-CM | POA: Diagnosis not present

## 2023-04-17 ENCOUNTER — Other Ambulatory Visit (HOSPITAL_COMMUNITY): Payer: Self-pay | Admitting: Urology

## 2023-04-17 DIAGNOSIS — R319 Hematuria, unspecified: Secondary | ICD-10-CM

## 2023-04-24 ENCOUNTER — Other Ambulatory Visit (HOSPITAL_COMMUNITY): Payer: Self-pay | Admitting: Urology

## 2023-04-24 DIAGNOSIS — R319 Hematuria, unspecified: Secondary | ICD-10-CM

## 2023-05-08 DIAGNOSIS — N200 Calculus of kidney: Secondary | ICD-10-CM | POA: Diagnosis not present

## 2023-05-12 ENCOUNTER — Other Ambulatory Visit (HOSPITAL_COMMUNITY): Payer: Self-pay

## 2023-05-22 ENCOUNTER — Other Ambulatory Visit: Payer: Self-pay | Admitting: Obstetrics & Gynecology

## 2023-05-22 DIAGNOSIS — Z1231 Encounter for screening mammogram for malignant neoplasm of breast: Secondary | ICD-10-CM

## 2023-05-26 DIAGNOSIS — N200 Calculus of kidney: Secondary | ICD-10-CM | POA: Diagnosis not present

## 2023-05-26 DIAGNOSIS — R599 Enlarged lymph nodes, unspecified: Secondary | ICD-10-CM | POA: Diagnosis not present

## 2023-05-26 DIAGNOSIS — R310 Gross hematuria: Secondary | ICD-10-CM | POA: Diagnosis not present

## 2023-05-31 ENCOUNTER — Other Ambulatory Visit (HOSPITAL_COMMUNITY): Payer: Self-pay

## 2023-05-31 ENCOUNTER — Telehealth: Payer: BC Managed Care – PPO | Admitting: Physician Assistant

## 2023-05-31 DIAGNOSIS — B9689 Other specified bacterial agents as the cause of diseases classified elsewhere: Secondary | ICD-10-CM

## 2023-05-31 DIAGNOSIS — J019 Acute sinusitis, unspecified: Secondary | ICD-10-CM | POA: Diagnosis not present

## 2023-05-31 MED ORDER — AMOXICILLIN-POT CLAVULANATE 875-125 MG PO TABS
1.0000 | ORAL_TABLET | Freq: Two times a day (BID) | ORAL | 0 refills | Status: DC
  Filled 2023-05-31: qty 14, 7d supply, fill #0

## 2023-05-31 NOTE — Progress Notes (Signed)
 I have spent 5 minutes in review of e-visit questionnaire, review and updating patient chart, medical decision making and response to patient.   Piedad Climes, PA-C

## 2023-05-31 NOTE — Progress Notes (Signed)

## 2023-06-09 ENCOUNTER — Other Ambulatory Visit (HOSPITAL_COMMUNITY): Payer: Self-pay

## 2023-06-09 ENCOUNTER — Telehealth: Payer: BC Managed Care – PPO | Admitting: Physician Assistant

## 2023-06-09 DIAGNOSIS — J329 Chronic sinusitis, unspecified: Secondary | ICD-10-CM | POA: Diagnosis not present

## 2023-06-09 MED ORDER — DOXYCYCLINE HYCLATE 100 MG PO TABS
100.0000 mg | ORAL_TABLET | Freq: Two times a day (BID) | ORAL | 0 refills | Status: DC
  Filled 2023-06-09: qty 20, 10d supply, fill #0

## 2023-06-09 NOTE — Progress Notes (Signed)

## 2023-06-14 ENCOUNTER — Other Ambulatory Visit: Payer: Self-pay

## 2023-06-16 ENCOUNTER — Other Ambulatory Visit (HOSPITAL_COMMUNITY): Payer: Self-pay

## 2023-06-29 ENCOUNTER — Other Ambulatory Visit: Payer: Self-pay

## 2023-06-30 ENCOUNTER — Encounter: Payer: Self-pay | Admitting: Pulmonary Disease

## 2023-07-10 ENCOUNTER — Other Ambulatory Visit (HOSPITAL_COMMUNITY): Payer: Self-pay

## 2023-07-10 DIAGNOSIS — Z01419 Encounter for gynecological examination (general) (routine) without abnormal findings: Secondary | ICD-10-CM | POA: Diagnosis not present

## 2023-07-10 DIAGNOSIS — Z124 Encounter for screening for malignant neoplasm of cervix: Secondary | ICD-10-CM | POA: Diagnosis not present

## 2023-07-10 LAB — HM PAP SMEAR

## 2023-07-10 LAB — RESULTS CONSOLE HPV: CHL HPV: NEGATIVE

## 2023-07-10 MED ORDER — DIETHYLPROPION HCL ER 75 MG PO TB24
75.0000 mg | ORAL_TABLET | Freq: Every morning | ORAL | 2 refills | Status: DC
  Filled 2023-07-10: qty 30, 30d supply, fill #0

## 2023-07-17 ENCOUNTER — Ambulatory Visit
Admission: RE | Admit: 2023-07-17 | Discharge: 2023-07-17 | Disposition: A | Discharge status: Home / Self Care | Payer: BC Managed Care – PPO | Source: Ambulatory Visit | Attending: Obstetrics & Gynecology | Admitting: Obstetrics & Gynecology

## 2023-07-17 ENCOUNTER — Encounter: Payer: Self-pay | Admitting: Physician Assistant

## 2023-07-17 DIAGNOSIS — Z1231 Encounter for screening mammogram for malignant neoplasm of breast: Secondary | ICD-10-CM | POA: Diagnosis not present

## 2023-08-15 ENCOUNTER — Ambulatory Visit: Admitting: Physician Assistant

## 2023-08-15 ENCOUNTER — Encounter: Payer: Self-pay | Admitting: Physician Assistant

## 2023-08-15 VITALS — BP 150/90 | HR 64 | Temp 97.5°F | Ht 63.0 in | Wt 201.5 lb

## 2023-08-15 DIAGNOSIS — E785 Hyperlipidemia, unspecified: Secondary | ICD-10-CM | POA: Diagnosis not present

## 2023-08-15 DIAGNOSIS — I10 Essential (primary) hypertension: Secondary | ICD-10-CM

## 2023-08-15 DIAGNOSIS — N819 Female genital prolapse, unspecified: Secondary | ICD-10-CM | POA: Diagnosis not present

## 2023-08-15 DIAGNOSIS — R1010 Upper abdominal pain, unspecified: Secondary | ICD-10-CM | POA: Diagnosis not present

## 2023-08-15 DIAGNOSIS — R590 Localized enlarged lymph nodes: Secondary | ICD-10-CM

## 2023-08-15 DIAGNOSIS — R931 Abnormal findings on diagnostic imaging of heart and coronary circulation: Secondary | ICD-10-CM

## 2023-08-15 DIAGNOSIS — M7989 Other specified soft tissue disorders: Secondary | ICD-10-CM

## 2023-08-15 LAB — COMPREHENSIVE METABOLIC PANEL WITH GFR
ALT: 46 U/L — ABNORMAL HIGH (ref 0–35)
AST: 34 U/L (ref 0–37)
Albumin: 4 g/dL (ref 3.5–5.2)
Alkaline Phosphatase: 63 U/L (ref 39–117)
BUN: 14 mg/dL (ref 6–23)
CO2: 30 meq/L (ref 19–32)
Calcium: 9.2 mg/dL (ref 8.4–10.5)
Chloride: 101 meq/L (ref 96–112)
Creatinine, Ser: 0.74 mg/dL (ref 0.40–1.20)
GFR: 88.65 mL/min (ref >60.00)
Glucose, Bld: 88 mg/dL (ref 70–99)
Potassium: 3.7 meq/L (ref 3.5–5.1)
Sodium: 138 meq/L (ref 135–145)
Total Bilirubin: 0.9 mg/dL (ref 0.2–1.2)
Total Protein: 7.2 g/dL (ref 6.0–8.3)

## 2023-08-15 LAB — LIPID PANEL
Cholesterol: 182 mg/dL (ref 0–200)
HDL: 54.8 mg/dL (ref >39.00)
LDL Cholesterol: 99 mg/dL (ref 0–99)
NonHDL: 127.23
Total CHOL/HDL Ratio: 3
Triglycerides: 141 mg/dL (ref 0.0–149.0)
VLDL: 28.2 mg/dL (ref 0.0–40.0)

## 2023-08-15 LAB — CBC WITH DIFFERENTIAL/PLATELET
Basophils Absolute: 0.1 10*3/uL (ref 0.0–0.1)
Basophils Relative: 0.8 % (ref 0.0–3.0)
Eosinophils Absolute: 0.2 10*3/uL (ref 0.0–0.7)
Eosinophils Relative: 2.6 % (ref 0.0–5.0)
HCT: 39.7 % (ref 36.0–46.0)
Hemoglobin: 13.4 g/dL (ref 12.0–15.0)
Lymphocytes Relative: 33.4 % (ref 12.0–46.0)
Lymphs Abs: 2.2 10*3/uL (ref 0.7–4.0)
MCHC: 33.8 g/dL (ref 30.0–36.0)
MCV: 90.7 fl (ref 78.0–100.0)
Monocytes Absolute: 0.5 10*3/uL (ref 0.1–1.0)
Monocytes Relative: 7.4 % (ref 3.0–12.0)
Neutro Abs: 3.7 10*3/uL (ref 1.4–7.7)
Neutrophils Relative %: 55.8 % (ref 43.0–77.0)
Platelets: 216 10*3/uL (ref 150.0–400.0)
RBC: 4.38 Mil/uL (ref 3.87–5.11)
RDW: 12.8 % (ref 11.5–15.5)
WBC: 6.6 10*3/uL (ref 4.0–10.5)

## 2023-08-15 LAB — LIPASE: Lipase: 34 U/L (ref 11.0–59.0)

## 2023-08-15 LAB — TSH: TSH: 1.74 u[IU]/mL (ref 0.35–5.50)

## 2023-08-15 NOTE — Progress Notes (Signed)
 [name] [name] is a 58 y.o. female here for a new problem.  History of Present Illness:   Chief Complaint  Patient presents with   Abdominal Pain    Pt c/o pain left upper quadrant of abdomen,under left rib, pinching feeling x 1 week. Also feels like something is going to fall out between her legs, has a bulge and pushes it back up, is getting worse.   Leg Swelling    Pt c/o increase swelling in legs and feet.    Vaginal Prolapse: She reports a vaginal prolapse, first noticed over 1 month prior to her last visit with Dr. Shelvy Dickens on 3/7. Since then, she states it feels as if "it is protruding".  Pt tends to manually reduce her prolapse in the mornings and after using the bathroom.  Denies any pain or unusual vaginal discharge.  Abdominal Pain // Enlarged Inguinal LYMPH NODEs Pt complains of LUQ abdominal pain under her left rib with a pinching sensation, starting 1 week ago.  She reports the results of her abdominal and pelvic CT in January showed  an enlarged inguinal lymph nodes, hiatal hernia, and a right nonobstructing kidney stone.   Leg Swelling: Pt complains of bilateral lower extremity swelling.  Her swelling tends to worsen at the end of the day and reports indentations from her socks.  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  She has a standing desk, elevates her feet under her desk, and wears tennis shoes to help reduce swelling.  After work she stays active with cooking, cleaning, and yard work.   Hypertension Pt is taking HCTZ 25 mg once daily, Atenolol  25 mg once daily, and Aspirin  81 mg once daily.  Pt was previously advised by cardiology to stop Atenolol  due to fatigue and start Cardizem .  She did not start due to hesitancies after researching the medication.  She monitors her BP regularly and reports average readings of 120s/80s.  Some shortness of breath with exertion such as carrying heavy items upstairs.  Denies any chest pain or concerning  shortness of breath.   Elevated calcium  score She saw cardiology last year and was recommended to start crestor  She did not start this  Past Medical History:  Diagnosis Date   Abnormal PFT    Arthritis    B12 deficiency    Chicken pox    GERD (gastroesophageal reflux disease)    High potassium    Hypertension    Liver lesion    Hepatic Adenoma   Migraines    Obesity    Vitamin D  deficiency      Social History   Tobacco Use   Smoking status: Never   Smokeless tobacco: Never  Vaping Use   Vaping status: Never Used  Substance Use Topics   Alcohol  use: Yes    Alcohol /week: 0.0 standard drinks of alcohol     Comment: occas   Drug use: No    Past Surgical History:  Procedure Laterality Date   KNEE ARTHROSCOPY Right 01/2017   MEDIAL PARTIAL KNEE REPLACEMENT     NASAL SINUS SURGERY  02/2019    Family History  Problem Relation Age of Onset   Diabetes Mother        late-age onset   Hyperlipidemia Mother    Breast cancer Mother    Throat cancer Father    Diabetes Sister    Hyperlipidemia Sister    Murrell Arrant' disease Sister    Stroke Brother    Breast  cancer Maternal Grandmother    Diabetes Maternal Grandmother    Hyperlipidemia Maternal Grandmother    Hypertension Maternal Grandmother    Dementia Maternal Grandmother    Diabetes Maternal Grandfather    Hyperlipidemia Maternal Grandfather    Breast cancer Paternal Grandmother 30   Diabetes Paternal Grandmother    Heart disease Paternal Grandmother    Hyperlipidemia Paternal Grandmother    Hypertension Paternal Grandmother    Colon cancer Neg Hx    Colon polyps Neg Hx    Rectal cancer Neg Hx    Stomach cancer Neg Hx     Allergies  Allergen Reactions   Lisinopril -Hydrochlorothiazide  Cough   Amlodipine  Other (See Comments)    Lower extremity edema.    Current Medications:   Current Outpatient Medications:    aspirin  EC 81 MG tablet, Take 1 tablet (81 mg total) by mouth daily. Swallow whole., Disp: 90  tablet, Rfl: 3   atenolol  (TENORMIN ) 25 MG tablet, Take 1 tablet (25 mg total) by mouth daily., Disp: 90 tablet, Rfl: 3   hydrochlorothiazide  (HYDRODIURIL ) 25 MG tablet, Take 1 tablet by mouth in the morning, Disp: 90 tablet, Rfl: 2   nystatin  cream (MYCOSTATIN ), Apply to the affected areas 2 times a day, Disp: 30 g, Rfl: 3   triamcinolone  cream (KENALOG ) 0.1 %, Apply a thin layer to affected area 2 times a day, Disp: 30 g, Rfl: 3   zolpidem  (AMBIEN ) 5 MG tablet, Take 1 tablet (5 mg total) by mouth at bedtime as needed., Disp: 90 tablet, Rfl: 1   Diethylpropion  HCl CR 75 MG TB24, Take 1 tablet (75 mg total) by mouth every morning. (Patient not taking: Reported on 08/15/2023), Disp: 30 tablet, Rfl: 2   Review of Systems:   Negative unless otherwise specified per HPI.  Vitals:   Vitals:   08/15/23 1105  BP: (!) 150/90  Pulse: 64  Temp: (!) 97.5 F (36.4 C)  TempSrc: Temporal  SpO2: 97%  Weight: 201 lb 8 oz (91.4 kg)  Height: 5\' 3"  (1.6 m)     Body mass index is 35.69 kg/m.  Physical Exam:   Physical Exam Vitals and nursing note reviewed. Exam conducted with a chaperone present.  Constitutional:      General: She is not in acute distress.    Appearance: She is well-developed. She is not ill-appearing or toxic-appearing.  Cardiovascular:     Rate and Rhythm: Normal rate and regular rhythm.     Pulses: Normal pulses.     Heart sounds: Normal heart sounds, S1 normal and S2 normal.  Pulmonary:     Effort: Pulmonary effort is normal.     Breath sounds: Normal breath sounds.  Abdominal:     Tenderness: There is abdominal tenderness in the left upper quadrant. There is no right CVA tenderness, left CVA tenderness, guarding or rebound.  Genitourinary:    Vagina: Prolapsed vaginal walls present.  Lymphadenopathy:     Lower Body: No right inguinal adenopathy. No left inguinal adenopathy.  Skin:    General: Skin is warm and dry.  Neurological:     Mental Status: She is alert.      GCS: GCS eye subscore is 4. GCS verbal subscore is 5. GCS motor subscore is 6.  Psychiatric:        Speech: Speech normal.        Behavior: Behavior normal. Behavior is cooperative.     Assessment and Plan:   1. Pain of upper abdomen (Primary) EKG tracing is  personally reviewed.  EKG notes NSR.  No acute changes.  Unclear etiology  Denies constipation Recommend NSAIDs to see if this is related to musculoskeletal strain If no improvement, consider OTC (available over the counter without a prescription) Pepcid or Prilosec Update blood work If LFTs remain elevated, strongly recommend liver ultrasound -- may consider full abdominal ultrasound to evaluate entire abdomen Also check h pylori Follow up based on symptom(s) and results - CBC with Differential/Platelet - Comprehensive metabolic panel with GFR - TSH - H. pylori breath test - Lipase - EKG 12-Lead  2. Hyperlipidemia, unspecified hyperlipidemia type; Elevated coronary artery calcium  score Update lipid panel and assess however calcium  score is indicative of need for statin and she is aware of this - Lipid panel  3. Female genital prolapse, unspecified type Recommend need for gynecology evaluation to diagnosis and discuss next steps  4. Inguinal lymphadenopathy Personally did not feel anything on my exam Urology plans to repeat CT in one year If CBC is off, low threshold to get ultrasound of her groin to further characterize  5. Essential hypertension Above goal today No evidence of end-organ damage on my exam Recommend patient monitor home blood pressure at least a few times weekly Continue HCTZ 25 mg once daily, Atenolol  25 mg once daily Discussed that she is due for yearly follow up with cardiology If home monitoring shows consistent elevation, or any symptom(s) develop, recommend reach out to us  for further advice on next steps  6. Leg swelling No shortness of breath, chest pain Recommend keeping legs elevated  during day; consider compression stockings Keep close eye on salt intake and fluid status Discussed that she is due for yearly follow up with cardiology If worsening symptom(s), needs to let us  know  Will update blood work today Consider as needed lasix   I, Rachel Rivera, acting as a Neurosurgeon for [employer], [place]., have documented all relevant documentation on the behalf of Alexander Iba, [place], as directed by  Alexander Iba, PA while in the presence of Alexander Iba, [place].  I, Alexander Iba, [place], have reviewed all documentation for this visit. The documentation on 08/15/23 for the exam, diagnosis, procedures, and orders are all accurate and complete.  I spent a total of 73 minutes on this visit, today 08/15/23, which included reviewing previous notes from Urology and Cardiology, ordering tests, discussing plan of care with patient and using shared-decision making on next steps, refilling medications, and documenting the findings in the note.   Alexander Iba, PA-C

## 2023-08-15 NOTE — Patient Instructions (Addendum)
 It was great to see you!  Follow up with gynecology regarding your prolapse Follow up with cardiology regarding blood pressure and leg swelling  We will update blood work today with low threshold to get imaging of pelvic lymphnodes and/or liver if indicated  Trial a few doses of ibuprofen for your stomach pain Consider trialing an OTC (available over the counter without a prescription) antacid such as Prilosec or Nexium for your stomach  We will also check you for h pylori.  If any chest pain, shortness of breath -- go to the ER  Take care,  Eugine Bubb PA-C

## 2023-08-16 ENCOUNTER — Encounter: Payer: Self-pay | Admitting: Physician Assistant

## 2023-08-16 DIAGNOSIS — R1010 Upper abdominal pain, unspecified: Secondary | ICD-10-CM

## 2023-08-16 LAB — H. PYLORI BREATH TEST: H. pylori Breath Test: NOT DETECTED

## 2023-08-30 ENCOUNTER — Telehealth: Payer: Self-pay | Admitting: Cardiovascular Disease

## 2023-08-30 NOTE — Telephone Encounter (Signed)
 Pt c/o swelling/edema: STAT if pt has developed SOB within 24 hours  If swelling, where is the swelling located?  Ankles & feet daily, calves occasionally   How much weight have you gained and in what time span? no significant weight gain, tend to lose then regain same 5-7 lbs  Have you gained 2 pounds in a day or 5 pounds in a week? I don't think so, I do not weigh ever day  Do you have a log of your daily weights (if so, list)? No  Are you currently taking a fluid pill? Yes, 25mg  Hydrochlorothiazide    Are you currently SOB? No   Have you traveled recently in a car or plane for an extended period of time? No   Answers received via patient schedules

## 2023-08-30 NOTE — Telephone Encounter (Signed)
 Called and spoke to pt in regards to bilateral Feet/Ankles swelling. She states this started about 3 weeks ago. 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 Her BP's are averaging 118-130/75-80. Last night it was 120/75. She also has some heart palpitations, but this is nothing new and she states she has recently been more stressed than usual. She has never had an Echocardiogram. She does not wear compression stockings. She is taking medications as prescribed. Any RX should be sent to [facility] outpatient [facility]. Has 1 year OV w/McAlhany on 12/14/2023.

## 2023-08-30 NOTE — Telephone Encounter (Signed)
 Called patient and got her scheduled on 09/19/23 at 2:45 w/ Renford Cartwright (first available). She will call back if anything changes.   Odie Benne, MD  [facility] Triage; Lowell Rude, RN5 minutes ago (5:26 PM)    She is due for an office follow up. Can we get her in to see me or an office APP? Thanks, chris

## 2023-09-01 ENCOUNTER — Ambulatory Visit
Admission: RE | Admit: 2023-09-01 | Discharge: 2023-09-01 | Disposition: A | Discharge status: Home / Self Care | Source: Ambulatory Visit | Attending: Physician Assistant

## 2023-09-01 ENCOUNTER — Encounter: Payer: Self-pay | Admitting: Physician Assistant

## 2023-09-01 DIAGNOSIS — R1010 Upper abdominal pain, unspecified: Secondary | ICD-10-CM

## 2023-09-01 DIAGNOSIS — K769 Liver disease, unspecified: Secondary | ICD-10-CM | POA: Diagnosis not present

## 2023-09-01 DIAGNOSIS — R109 Unspecified abdominal pain: Secondary | ICD-10-CM | POA: Diagnosis not present

## 2023-09-08 ENCOUNTER — Other Ambulatory Visit: Payer: Self-pay | Admitting: Physician Assistant

## 2023-09-08 ENCOUNTER — Other Ambulatory Visit (HOSPITAL_COMMUNITY): Payer: Self-pay

## 2023-09-08 MED ORDER — ZOLPIDEM TARTRATE 5 MG PO TABS
5.0000 mg | ORAL_TABLET | Freq: Every evening | ORAL | 1 refills | Status: DC
Start: 2023-09-08 — End: 2024-03-12
  Filled 2023-09-08 – 2023-09-11 (×2): qty 90, 90d supply, fill #0
  Filled 2023-12-11: qty 90, 90d supply, fill #1

## 2023-09-08 NOTE — Telephone Encounter (Signed)
 Pt requesting refill for Zolpidem  5 mg tablet. Pt scheduled on 09/19/2023.

## 2023-09-11 ENCOUNTER — Other Ambulatory Visit (HOSPITAL_COMMUNITY): Payer: Self-pay

## 2023-09-11 ENCOUNTER — Other Ambulatory Visit: Payer: Self-pay

## 2023-09-12 ENCOUNTER — Other Ambulatory Visit (HOSPITAL_COMMUNITY): Payer: Self-pay

## 2023-09-12 DIAGNOSIS — N8111 Cystocele, midline: Secondary | ICD-10-CM | POA: Diagnosis not present

## 2023-09-12 MED ORDER — ESTRADIOL 10 MCG VA TABS
ORAL_TABLET | VAGINAL | 2 refills | Status: AC
  Filled 2023-09-12: qty 16, 56d supply, fill #0
  Filled 2023-11-06: qty 16, 56d supply, fill #1
  Filled 2024-03-27: qty 16, 56d supply, fill #2

## 2023-09-12 MED ORDER — PHENTERMINE HCL 37.5 MG PO TABS
37.5000 mg | ORAL_TABLET | Freq: Every morning | ORAL | 2 refills | Status: DC
  Filled 2023-09-12: qty 30, 30d supply, fill #0
  Filled 2023-11-06: qty 30, 30d supply, fill #1
  Filled 2023-12-11: qty 30, 30d supply, fill #2

## 2023-09-15 ENCOUNTER — Other Ambulatory Visit: Payer: Self-pay | Admitting: *Deleted

## 2023-09-19 ENCOUNTER — Other Ambulatory Visit (HOSPITAL_COMMUNITY): Payer: Self-pay

## 2023-09-19 ENCOUNTER — Encounter: Payer: Self-pay | Admitting: Emergency Medicine

## 2023-09-19 ENCOUNTER — Ambulatory Visit: Attending: Emergency Medicine

## 2023-09-19 ENCOUNTER — Ambulatory Visit: Attending: Emergency Medicine | Admitting: Emergency Medicine

## 2023-09-19 ENCOUNTER — Ambulatory Visit (INDEPENDENT_AMBULATORY_CARE_PROVIDER_SITE_OTHER): Payer: BC Managed Care – PPO | Admitting: Physician Assistant

## 2023-09-19 ENCOUNTER — Encounter: Payer: Self-pay | Admitting: Physician Assistant

## 2023-09-19 VITALS — BP 122/70 | HR 64 | Ht 63.0 in | Wt 202.2 lb

## 2023-09-19 VITALS — BP 128/80 | HR 64 | Temp 97.2°F | Ht 63.0 in | Wt 201.2 lb

## 2023-09-19 DIAGNOSIS — I10 Essential (primary) hypertension: Secondary | ICD-10-CM

## 2023-09-19 DIAGNOSIS — E785 Hyperlipidemia, unspecified: Secondary | ICD-10-CM

## 2023-09-19 DIAGNOSIS — R600 Localized edema: Secondary | ICD-10-CM | POA: Diagnosis not present

## 2023-09-19 DIAGNOSIS — R002 Palpitations: Secondary | ICD-10-CM

## 2023-09-19 DIAGNOSIS — I2510 Atherosclerotic heart disease of native coronary artery without angina pectoris: Secondary | ICD-10-CM

## 2023-09-19 DIAGNOSIS — I2583 Coronary atherosclerosis due to lipid rich plaque: Secondary | ICD-10-CM

## 2023-09-19 DIAGNOSIS — Z0000 Encounter for general adult medical examination without abnormal findings: Secondary | ICD-10-CM

## 2023-09-19 DIAGNOSIS — E669 Obesity, unspecified: Secondary | ICD-10-CM | POA: Diagnosis not present

## 2023-09-19 DIAGNOSIS — E559 Vitamin D deficiency, unspecified: Secondary | ICD-10-CM

## 2023-09-19 DIAGNOSIS — R051 Acute cough: Secondary | ICD-10-CM

## 2023-09-19 DIAGNOSIS — E538 Deficiency of other specified B group vitamins: Secondary | ICD-10-CM | POA: Diagnosis not present

## 2023-09-19 DIAGNOSIS — F5101 Primary insomnia: Secondary | ICD-10-CM

## 2023-09-19 LAB — COMPREHENSIVE METABOLIC PANEL WITH GFR
ALT: 48 U/L — ABNORMAL HIGH (ref 0–35)
AST: 35 U/L (ref 0–37)
Albumin: 3.9 g/dL (ref 3.5–5.2)
Alkaline Phosphatase: 61 U/L (ref 39–117)
BUN: 18 mg/dL (ref 6–23)
CO2: 26 meq/L (ref 19–32)
Calcium: 9.6 mg/dL (ref 8.4–10.5)
Chloride: 102 meq/L (ref 96–112)
Creatinine, Ser: 0.72 mg/dL (ref 0.40–1.20)
GFR: 91.55 mL/min (ref >60.00)
Glucose, Bld: 104 mg/dL — ABNORMAL HIGH (ref 70–99)
Potassium: 3.4 meq/L — ABNORMAL LOW (ref 3.5–5.1)
Sodium: 138 meq/L (ref 135–145)
Total Bilirubin: 0.9 mg/dL (ref 0.2–1.2)
Total Protein: 7 g/dL (ref 6.0–8.3)

## 2023-09-19 LAB — VITAMIN B12: Vitamin B-12: 254 pg/mL (ref 211–911)

## 2023-09-19 LAB — VITAMIN D 25 HYDROXY (VIT D DEFICIENCY, FRACTURES): VITD: 25.61 ng/mL — ABNORMAL LOW (ref 30.00–100.00)

## 2023-09-19 LAB — HEMOGLOBIN A1C: Hgb A1c MFr Bld: 5.8 % (ref 4.6–6.5)

## 2023-09-19 MED ORDER — AMOXICILLIN-POT CLAVULANATE 875-125 MG PO TABS
1.0000 | ORAL_TABLET | Freq: Two times a day (BID) | ORAL | 0 refills | Status: DC
  Filled 2023-09-19: qty 14, 7d supply, fill #0

## 2023-09-19 MED ORDER — HYDROCHLOROTHIAZIDE 25 MG PO TABS
25.0000 mg | ORAL_TABLET | Freq: Every morning | ORAL | 3 refills | Status: AC
  Filled 2023-09-19: qty 90, 90d supply, fill #0
  Filled 2023-12-28: qty 90, 90d supply, fill #1
  Filled 2024-03-27: qty 90, 90d supply, fill #2

## 2023-09-19 MED ORDER — NYSTATIN 100000 UNIT/GM EX CREA
TOPICAL_CREAM | CUTANEOUS | 3 refills | Status: DC
  Filled 2023-09-19: qty 30, 15d supply, fill #0
  Filled 2023-10-18: qty 30, 15d supply, fill #1
  Filled 2024-02-07: qty 30, 15d supply, fill #2

## 2023-09-19 MED ORDER — TRIAMCINOLONE ACETONIDE 0.1 % EX CREA
TOPICAL_CREAM | Freq: Two times a day (BID) | CUTANEOUS | 3 refills | Status: DC
  Filled 2023-09-19 (×2): qty 30, 15d supply, fill #0
  Filled 2023-10-18: qty 30, 15d supply, fill #1
  Filled 2023-12-11: qty 30, 15d supply, fill #2
  Filled 2024-02-07: qty 30, 15d supply, fill #3

## 2023-09-19 MED ORDER — ATENOLOL 25 MG PO TABS
25.0000 mg | ORAL_TABLET | Freq: Every day | ORAL | 3 refills | Status: AC
  Filled 2023-09-19: qty 90, 90d supply, fill #0
  Filled 2023-12-28: qty 90, 90d supply, fill #1
  Filled 2024-03-27: qty 90, 90d supply, fill #2

## 2023-09-19 NOTE — Progress Notes (Unsigned)
Enrolled for [employer] to mail a ZIO XT long term holter monitor to the patients address on file.   Dr. McAlhany to read. 

## 2023-09-19 NOTE — Patient Instructions (Signed)
 It was great to see you!  Please go to the lab for blood work.   Our office will call you with your results unless you have chosen to receive results via MyChart.  If your blood work is normal we will follow-up each year for physicals and as scheduled for chronic medical problems.  If anything is abnormal we will treat accordingly and get you in for a follow-up.  Take care,  Lelon Mast

## 2023-09-19 NOTE — Patient Instructions (Addendum)
 Medication Instructions:  NO CHANGES   Lab Work: BMET, CBC, AND TSH TO BE DONE TODAY.   Testing/Procedures: Your physician has requested that you have an echocardiogram. Echocardiography is a painless test that uses sound waves to create images of your heart. It provides your doctor with information about the size and shape of your heart and how well your heart's chambers and valves are working. This procedure takes approximately one hour. There are no restrictions for this procedure. Please do NOT wear cologne, perfume, aftershave, or lotions (deodorant is allowed). Please arrive 15 minutes prior to your appointment time.  Please note: We ask at that you not bring children with you during ultrasound (echo/ vascular) testing. Due to room size and safety concerns, children are not allowed in the ultrasound rooms during exams. Our front office staff cannot provide observation of children in our lobby area while testing is being conducted. An adult accompanying a patient to their appointment will only be allowed in the ultrasound room at the discretion of the ultrasound technician under special circumstances. We apologize for any inconvenience.   ZIO XT- Long Term Monitor Instructions  Your physician has requested you wear a ZIO patch monitor for 7 days.  This is a single patch monitor. [employer] supplies one patch monitor per enrollment. Additional stickers are not available. Please do not apply patch if you will be having a Nuclear Stress Test,  Echocardiogram, Cardiac CT, MRI, or Chest Xray during the period you would be wearing the  monitor. The patch cannot be worn during these tests. You cannot remove and re-apply the  ZIO XT patch monitor.  Your ZIO patch monitor will be mailed 3 day USPS to your address on file. It may take 3-5 days  to receive your monitor after you have been enrolled.  Once you have received your monitor, please review the enclosed instructions. Your monitor  has  already been registered assigning a specific monitor serial # to you.  Billing and Patient Assistance Program Information  We have supplied [employer] with any of your insurance information on file for billing purposes. [employer] offers a sliding scale Patient Assistance Program for patients that do not have  insurance, or whose insurance does not completely cover the cost of the ZIO monitor.  You must apply for the Patient Assistance Program to qualify for this discounted rate.  To apply, please call [employer] at [phone], select option 4, select option 2, ask to apply for  Patient Assistance Program. Sanna Miyoko Hashimi will ask your household income, and how many people  are in your household. They will quote your out-of-pocket cost based on that information.  [employer] will also be able to set up a 67-month, interest-free payment plan if needed.  Applying the monitor   Shave hair from upper left chest.  Hold abrader disc by orange tab. Rub abrader in 40 strokes over the upper left chest as  indicated in your monitor instructions.  Clean area with 4 enclosed alcohol  pads. Let dry.  Apply patch as indicated in monitor instructions. Patch will be placed under collarbone on left  side of chest with arrow pointing upward.  Rub patch adhesive wings for 2 minutes. Remove white label marked "1". Remove the white  label marked "2". Rub patch adhesive wings for 2 additional minutes.  While looking in a mirror, press and release button in center of patch. A small green light will  flash 3-4 times. This will be your only indicator that the monitor has been turned  on.  Do not shower for the first 24 hours. You may shower after the first 24 hours.  Press the button if you feel a symptom. You will hear a small click. Record Date, Time and  Symptom in the Patient Logbook.  When you are ready to remove the patch, follow instructions on the last 2 pages of Patient  Logbook. Stick patch monitor onto the last page of  Patient Logbook.  Place Patient Logbook in the blue and white box. Use locking tab on box and tape box closed  securely. The blue and white box has prepaid postage on it. Please place it in the mailbox as  soon as possible. Your physician should have your test results approximately 7 days after the  monitor has been mailed back to [facility].  Call [facility] Customer Care at [phone] if you have questions regarding  your ZIO XT patch monitor. Call them immediately if you see an orange light blinking on your  monitor.  If your monitor falls off in less than 4 days, contact our Monitor department at [phone].  If your monitor becomes loose or falls off after 4 days call [employer] at [phone] for  suggestions on securing your monitor   Follow-Up: At [facility], you and your health needs are our priority.  As part of our continuing mission to provide you with exceptional heart care, our providers are all part of one team.  This team includes your primary Cardiologist (physician) and Advanced Practice Providers or APPs (Physician Assistants and Nurse Practitioners) who all work together to provide you with the care you need, when you need it.  Your next appointment:   4-6 WEEKS  Provider:   MADISON FOUNTAIN, DNP

## 2023-09-19 NOTE — Progress Notes (Signed)
 Subjective:    [name] [name] is a 58 y.o. female and is here for a comprehensive physical exam.  HPI  There are no preventive care reminders to display for this patient.  Acute Concerns: Congestion Pt complains of congestion productive cough with green sputum; ongoing about 2 weeks.  She notes these symptoms tend to occur in the mornings.  Has tried OTC cold medications with intolerances, stating it makes her sick to her stomach or makes her heart race.  No recent known exposure.   Chronic Issues: Hypertension: Currently on Atenolol  25 mg once daily, HCTZ 25 mg once daily, and ASA 81 mg daily.  Compliant with current antihypertensive regimen.  Tolerating all meds well with no adverse side effects reported.  No acute concerns.   CAD Noted on calcium  score, sent to cardiology for this She was given crestor  10 mg daily from cardiology but never started it due to concern for joint side effect(s)   Insomnia: Pt is on Ambien  5 mg as needed.  Tolerating well with no side effects.  No acute concerns today.   Leg Swelling // Palpitations: Her bilateral lower extremity swelling has subsided.  She is scheduled to follow up with cardiology today.  She also endorses occasional skipped beats and palpitations, which often resolve with a hard cough.  These episodes only occur when at rest, sitting, or lying down.  Denies any associated pain, constipation or straining with bowel movements.   Vaginal Prolapse: Pt followed up with Dr. Shelvy Dickens of [facility]  She was prescribed Vagifem 10 mcg to help strengthen her anterior vaginal wall.  Pt is tolerating this well and denies any adverse side effects.  Has not had significant changes or improvements yet; "too soon to tell".  She next follows up with Dr. Shelvy Dickens in 3 months -- August   Abdominal Pain // Enlarged Inguinal Lymph Nodes: Pt continues to experience occasional LUQ abdominal pain.  She believes it may be a muscular.  Overall, her  pain has improved.   Health Maintenance: Immunizations -- UTD.  Colonoscopy -- Last done 05/08/2015. Results were normal. Repeat 10 years; next due 05/07/2025. Mammogram -- UTD, last done 07/17/23. Results showed no mammographic evidence for malignancy. Repeat 1 year.  PAP -- UTD, last done 07/10/2023. Results were NILM. Repeat 5 years; next due 07/09/2028. Bone Density -- N/a Diet -- Healthy diet.  Exercise -- As able.   Sleep habits -- No acute concerns.  Mood -- Stable.   UTD with dentist? - Yes UTD with eye doctor? - Yes  Weight history: Wt Readings from Last 10 Encounters:  09/19/23 201 lb 4 oz (91.3 kg)  08/15/23 201 lb 8 oz (91.4 kg)  03/14/23 203 lb 4 oz (92.2 kg)  09/14/22 202 lb 6.1 oz (91.8 kg)  08/08/22 203 lb 6.4 oz (92.3 kg)  07/29/22 203 lb (92.1 kg)  07/15/22 201 lb (91.2 kg)  06/16/22 201 lb 6.1 oz (91.3 kg)  06/08/22 203 lb 12.8 oz (92.4 kg)  04/27/22 200 lb (90.7 kg)   Body mass index is 35.65 kg/m. No LMP recorded (lmp unknown). Patient is postmenopausal.  Alcohol  use:  reports current alcohol  use.  Tobacco use:  Tobacco Use: Low Risk  (09/19/2023)   Patient History    Smoking Tobacco Use: Never    Smokeless Tobacco Use: Never    Passive Exposure: Not on file   Eligible for lung cancer screening? no     09/19/2023    9:14 AM  Depression  screen PHQ 2/9  Decreased Interest 0  Down, Depressed, Hopeless 0  PHQ - 2 Score 0     Other providers/specialists: Patient Care Team: Alexander Iba, [place] as PCP - General (Physician Assistant) Odie Benne, MD as PCP - Cardiology (Cardiology) Mannam, Praveen, MD as Consulting Physician (Pulmonary Disease)    PMHx, SurgHx, SocialHx, Medications, and Allergies were reviewed in the Visit Navigator and updated as appropriate.   Past Medical History:  Diagnosis Date   Abnormal PFT    Arthritis    B12 deficiency    Chicken pox    GERD (gastroesophageal reflux disease)    High potassium     Hypertension    Liver lesion    Hepatic Adenoma   Migraines    Obesity    Vitamin D  deficiency      Past Surgical History:  Procedure Laterality Date   KNEE ARTHROSCOPY Right 01/2017   MEDIAL PARTIAL KNEE REPLACEMENT     NASAL SINUS SURGERY  02/2019     Family History  Problem Relation Age of Onset   Diabetes Mother        late-age onset   Hyperlipidemia Mother    Breast cancer Mother    Throat cancer Father    Diabetes Sister    Hyperlipidemia Sister    Murrell Arrant' disease Sister    Stroke Brother    Liver disease Brother        s/p liver transplant   Breast cancer Maternal Grandmother    Diabetes Maternal Grandmother    Hyperlipidemia Maternal Grandmother    Hypertension Maternal Grandmother    Dementia Maternal Grandmother    Diabetes Maternal Grandfather    Hyperlipidemia Maternal Grandfather    Breast cancer Paternal Grandmother 30   Diabetes Paternal Grandmother    Heart disease Paternal Grandmother    Hyperlipidemia Paternal Grandmother    Hypertension Paternal Grandmother    Colon cancer Neg Hx    Colon polyps Neg Hx    Rectal cancer Neg Hx    Stomach cancer Neg Hx     Social History   Tobacco Use   Smoking status: Never   Smokeless tobacco: Never  Vaping Use   Vaping status: Never Used  Substance Use Topics   Alcohol  use: Yes    Alcohol /week: 0.0 standard drinks of alcohol     Comment: occas   Drug use: No    Review of Systems:   Review of Systems  Constitutional:  Negative for chills, fever, malaise/fatigue and weight loss.  HENT:  Negative for hearing loss, sinus pain and sore throat.   Respiratory:  Negative for cough and hemoptysis.   Cardiovascular:  Negative for chest pain, palpitations, leg swelling and PND.  Gastrointestinal:  Negative for abdominal pain, constipation, diarrhea, heartburn, nausea and vomiting.  Genitourinary:  Negative for dysuria, frequency and urgency.  Musculoskeletal:  Negative for back pain, myalgias and neck  pain.  Skin:  Negative for itching and rash.  Neurological:  Negative for dizziness, tingling, seizures and headaches.  Endo/Heme/Allergies:  Negative for polydipsia.  Psychiatric/Behavioral:  Negative for depression. The patient is not nervous/anxious.    Objective:   BP 128/80 (BP Location: Left Arm, Patient Position: Sitting, Cuff Size: Large)   Pulse 64   Temp (!) 97.2 F (36.2 C) (Temporal)   Ht 5\' 3"  (1.6 m)   Wt 201 lb 4 oz (91.3 kg)   LMP  (LMP Unknown)   SpO2 94%   BMI 35.65 kg/m  Body mass index is  35.65 kg/m.   General Appearance:    Alert, cooperative, no distress, appears stated age  Head:    Normocephalic, without obvious abnormality, atraumatic  Eyes:    PERRL, conjunctiva/corneas clear, EOM's intact, fundi    benign, both eyes  Ears:    Normal TM's and external ear canals, both ears  Nose:   Nares normal, septum midline, mucosa normal, no drainage    or sinus tenderness  Throat:   Lips, mucosa, and tongue normal; teeth and gums normal  Neck:   Supple, symmetrical, trachea midline, no adenopathy;    thyroid :  no enlargement/tenderness/nodules; no carotid   bruit or JVD  Back:     Symmetric, no curvature, ROM normal, no CVA tenderness  Lungs:     Clear to auscultation bilaterally, respirations unlabored  Chest Wall:    No tenderness or deformity   Heart:    Regular rate and rhythm, S1 and S2 normal, no murmur, rub or gallop  Breast Exam:    Deferred  Abdomen:     Soft, non-tender, bowel sounds active all four quadrants,    no masses, no organomegaly  Genitalia:    Deferred  Extremities:   Extremities normal, atraumatic, no cyanosis or edema  Pulses:   2+ and symmetric all extremities  Skin:   Skin color, texture, turgor normal, no rashes or lesions  Lymph nodes:   Cervical, supraclavicular, and axillary nodes normal  Neurologic:   CNII-XII intact, normal strength, sensation and reflexes    throughout    Assessment/Plan:   Routine physical  examination Today patient counseled on age appropriate routine health concerns for screening and prevention, each reviewed and up to date or declined. Immunizations reviewed and up to date or declined. Labs ordered and reviewed. Risk factors for depression reviewed and negative. Hearing function and visual acuity are intact. ADLs screened and addressed as needed. Functional ability and level of safety reviewed and appropriate. Education, counseling and referrals performed based on assessed risks today. Patient provided with a copy of personalized plan for preventive services.  Hyperlipidemia, unspecified hyperlipidemia type; CAD Lipid panel updated at last visit Recommend close follow up with cardiology to investigate if another statin would be appropriate as she is reluctant to start crestor  despite elevated calcium  score  Essential hypertension Well controlled presently Continue atenolol  25 mg daily and hydrochloroTHIAZIDE  25 mg daily Follow up with cardiology later today  Obesity, unspecified class, unspecified obesity type, unspecified whether serious comorbidity present Continue efforts at weights loss She is going to discuss with cardiology if she can start phentermine  (prescribed by Dr Shelvy Dickens)  Vitamin D  deficiency Update and provide recs  B12 deficiency Update and provide recs  Primary insomnia Continue Ambien  5 mg nightly as needed Denies any concerning side effect(s) with medication Follow up in 6 months She is aware of risks and benefits/side effect(s) of medication  Cough, acute No red flags on exam.   Will initiate augmenting per orders.  Discussed taking medications as prescribed.  Reviewed return precautions including new or worsening fever, SOB, new or worsening cough or other concerns.  Push fluids and rest.  I recommend that patient follow-up if symptoms worsen or persist despite treatment x 7-10 days, sooner if needed.    I, Bernita Bristle, acting as a Neurosurgeon for  Alexander Iba, [place]., have documented all relevant documentation on the behalf of Alexander Iba, [place], as directed by   while in the presence of Alexander Iba, [place].  I, Alexander Iba, [place], have reviewed  all documentation for this visit. The documentation on 09/19/23 for the exam, diagnosis, procedures, and orders are all accurate and complete.  Alexander Iba, PA-C [facility]

## 2023-09-19 NOTE — Progress Notes (Signed)
 Cardiology Office Note:    Date:  09/19/2023  ID:  [name] [name], DOB June 13, 1965, MRN [id number] PCP: Alexander Iba, PA  [facility] Providers Cardiologist:  Antoinette Batman, MD       Patient Profile:       Chief Complaint: Acute visit for palpitations and lower extremity swelling History of Present Illness:  [name] [name] is a 58 y.o. female with visit-pertinent history of GERD, hypertension, migraines  Patient establish care with cardiology service on 08/08/2022.  She was referred by her PCP for evaluation of CAD.  She had CT coronary calcium  score 64.7 on 06/29/2022 (89 percentile).  Patient was doing well at the time without cardiovascular concerns or complaints.  She was started on aspirin  81 mg and Crestor  10 mg daily.  Her blood pressure was well-controlled on atenolol  however she felt like it was making her fatigued.  Atenolol  was discontinued and patient was started on Cardizem  CD 120 mg daily (she did not tolerate lisinopril , Benicar , Norvasc  in the past).  She has not been seen back in office since initial visit.  She called into the nurse triage line on 08/30/2023.  She had noted bilateral feet/ankle swelling for about 3 weeks.  She is scheduled for an OV.   Discussed the use of AI scribe software for clinical note transcription with the patient, who gave verbal consent to proceed.  History of Present Illness [name] PLACIDE "Anselmo Kings" is a 58 year old female with coronary artery disease who presents with leg swelling and palpitations.  She began experiencing swelling in her ankles in mid-April, extending from her feet to her calves, most pronounced by the end of the day. The swelling has resolved prior to this visit.  She noted the swelling lasted approximately 1 month.  She noted she began drinking more water and becoming more hydrated which she felt like help her swelling.  She experiences occasional palpitations, described as a 'pinch in my chest' or feeling like her  heart 'speeds up and sometimes slows way down,' occurring primarily when sitting or lying down, every three to four days.  She notes she has had palpitations for what seems like several years however recently the frequency and duration for palpitations have increased.  No symptoms occur during physical activity.   She notes she is very busy individual.  She works sitting down most the day at least 8 to 9 hours a day.  She does not get much exercise.  She does note that she does have a relatively healthy diet.  She never did start her rosuvastatin  since last visit.  She noted she has a history of fatty liver disease and osteoarthritis and is concerned about potential side effects from statin therapy.   Review of systems:  Please see the history of present illness. All other systems are reviewed and otherwise negative.      Studies Reviewed:        CT coronary scoring 06/29/2022 IMPRESSION: Coronary calcium  score of 64.7. This was 89th percentile for age-, race-, and sex-matched controls.  Risk Assessment/Calculations:              Physical Exam:   VS:  BP 122/70   Pulse 64   Ht 5\' 3"  (1.6 m)   Wt 202 lb 3.2 oz (91.7 kg)   LMP  (LMP Unknown)   SpO2 99%   BMI 35.82 kg/m    Wt Readings from Last 3 Encounters:  09/19/23 202 lb 3.2 oz (91.7 kg)  09/19/23 201 lb 4 oz (91.3 kg)  08/15/23 201 lb 8 oz (91.4 kg)    GEN: Well nourished, well developed in no acute distress NECK: No JVD; No carotid bruits CARDIAC: RRR, no murmurs, rubs, gallops RESPIRATORY:  Clear to auscultation without rales, wheezing or rhonchi  ABDOMEN: Soft, non-tender, non-distended EXTREMITIES:  No edema; No acute deformity      Assessment and Plan:  Coronary artery disease CT cardiac scoring 06/2022 showed coronary calcium  score 64.7 (89th percentile) - Today patient is without any anginal symptoms, no indication for further ischemic evaluation this time - Continue aspirin  81 mg daily  Hyperlipidemia LDL  99, HDL 54, TG 141 on 07/2023 LDL is currently not under good control and above goal of less than 70 - She politely deferred statin lowering therapy today and in the past due to potential side effects - She will consider starting ezetimibe which we will address on follow-up visit - Continue with heart healthy dieting, weight loss, and physical exercise  Hypertension Blood pressure today is 122/70 and under excellent control - Maintain home BP monitoring - Continue atenolol  25 mg daily and hydrochlorothiazide  25 mg daily  Lower extremity swelling Patient notes bilateral lower extremity swelling to ankles/feet that lasted approximately 1 month.  The swelling improved in the morning and is most pronounced in the late afternoon.  She notes that the leg swelling has been improving over the last several weeks - Plan for echocardiogram today to further evaluate LV/RV function and for any valvular abnormalities - She does not require loop diuretic therapy at this time - Encouraged leg elevation, daily exercise, weight loss, lower extremity stockings  Palpitations Patient has noted palpitations that have been ongoing and intermittent for at least over a year.  However over the past month the frequency and duration of her palpitations which are described as "pinching my chest" or tachycardia which have acutely worsened.  She notes her symptoms always occur at rest and not on exertion - Plan for 7-day ZIO monitor to assess for potential arrhythmias - BMET, TSH, Mag today       Dispo:  Return in about 6 weeks (around 10/31/2023).  Signed, Ava Boatman, NP

## 2023-09-20 ENCOUNTER — Ambulatory Visit: Payer: Self-pay | Admitting: Physician Assistant

## 2023-09-20 ENCOUNTER — Ambulatory Visit: Payer: Self-pay | Admitting: Emergency Medicine

## 2023-09-20 LAB — CBC
Hematocrit: 41 % (ref 34.0–46.6)
Hemoglobin: 13.5 g/dL (ref 11.1–15.9)
MCH: 30.4 pg (ref 26.6–33.0)
MCHC: 32.9 g/dL (ref 31.5–35.7)
MCV: 92 fL (ref 79–97)
Platelets: 245 10*3/uL (ref 150–450)
RBC: 4.44 x10E6/uL (ref 3.77–5.28)
RDW: 12.3 % (ref 11.7–15.4)
WBC: 7 10*3/uL (ref 3.4–10.8)

## 2023-09-20 LAB — TSH: TSH: 2.13 u[IU]/mL (ref 0.450–4.500)

## 2023-09-20 LAB — BASIC METABOLIC PANEL WITH GFR
BUN/Creatinine Ratio: 24 — ABNORMAL HIGH (ref 9–23)
BUN: 17 mg/dL (ref 6–24)
CO2: 25 mmol/L (ref 20–29)
Calcium: 9.8 mg/dL (ref 8.7–10.2)
Chloride: 101 mmol/L (ref 96–106)
Creatinine, Ser: 0.7 mg/dL (ref 0.57–1.00)
Glucose: 99 mg/dL (ref 70–99)
Potassium: 4.5 mmol/L (ref 3.5–5.2)
Sodium: 142 mmol/L (ref 134–144)
eGFR: 100 mL/min/{1.73_m2} (ref >59)

## 2023-09-26 ENCOUNTER — Other Ambulatory Visit (HOSPITAL_COMMUNITY): Payer: Self-pay

## 2023-09-26 ENCOUNTER — Other Ambulatory Visit (HOSPITAL_BASED_OUTPATIENT_CLINIC_OR_DEPARTMENT_OTHER): Payer: Self-pay

## 2023-09-26 MED ORDER — BD LUER-LOK SYRINGE 25G X 1" 3 ML MISC
0 refills | Status: DC
  Filled 2023-09-26: qty 6, 84d supply, fill #0

## 2023-09-26 MED ORDER — CYANOCOBALAMIN 1000 MCG/ML IJ SOLN
INTRAMUSCULAR | 1 refills | Status: DC
Start: 2023-09-26 — End: 2024-03-13
  Filled 2023-09-26: qty 3, 21d supply, fill #0
  Filled 2023-10-18: qty 3, 21d supply, fill #1

## 2023-10-18 ENCOUNTER — Telehealth: Admitting: Physician Assistant

## 2023-10-18 ENCOUNTER — Other Ambulatory Visit (HOSPITAL_COMMUNITY): Payer: Self-pay

## 2023-10-18 DIAGNOSIS — R002 Palpitations: Secondary | ICD-10-CM

## 2023-10-18 DIAGNOSIS — J019 Acute sinusitis, unspecified: Secondary | ICD-10-CM

## 2023-10-18 MED ORDER — AMOXICILLIN-POT CLAVULANATE 875-125 MG PO TABS
1.0000 | ORAL_TABLET | Freq: Two times a day (BID) | ORAL | 0 refills | Status: AC
  Filled 2023-10-18: qty 14, 7d supply, fill #0

## 2023-10-18 NOTE — Progress Notes (Signed)

## 2023-11-01 ENCOUNTER — Other Ambulatory Visit (HOSPITAL_COMMUNITY)

## 2023-11-06 ENCOUNTER — Other Ambulatory Visit (HOSPITAL_BASED_OUTPATIENT_CLINIC_OR_DEPARTMENT_OTHER): Payer: Self-pay

## 2023-11-06 ENCOUNTER — Other Ambulatory Visit (HOSPITAL_COMMUNITY): Payer: Self-pay

## 2023-11-06 ENCOUNTER — Other Ambulatory Visit: Payer: Self-pay

## 2023-11-10 ENCOUNTER — Other Ambulatory Visit (HOSPITAL_COMMUNITY): Payer: Self-pay

## 2023-12-05 ENCOUNTER — Ambulatory Visit (HOSPITAL_COMMUNITY)
Admission: RE | Admit: 2023-12-05 | Discharge: 2023-12-05 | Disposition: A | Discharge status: Home / Self Care | Source: Ambulatory Visit | Attending: Cardiology | Admitting: Cardiology

## 2023-12-05 DIAGNOSIS — R600 Localized edema: Secondary | ICD-10-CM | POA: Diagnosis not present

## 2023-12-05 LAB — ECHOCARDIOGRAM COMPLETE
Area-P 1/2: 3.66 cm2
S' Lateral: 2.6 cm

## 2023-12-11 ENCOUNTER — Other Ambulatory Visit: Payer: Self-pay | Admitting: Physician Assistant

## 2023-12-12 ENCOUNTER — Other Ambulatory Visit: Payer: Self-pay

## 2023-12-12 ENCOUNTER — Other Ambulatory Visit (HOSPITAL_COMMUNITY): Payer: Self-pay

## 2023-12-12 MED ORDER — BD LUER-LOK SYRINGE 25G X 1" 3 ML MISC
0 refills | Status: DC
  Filled 2023-12-12: qty 1, 1d supply, fill #0

## 2023-12-13 NOTE — Progress Notes (Unsigned)
 No chief complaint on file.  History of Present Illness: 58 yo female with history of GERD, HTN and migraines who is here today for follow up. She was seen in our office in April 2024 for the evaluation of CAD/abnormal coronary calcium  score. CT coronary calcium  score of 64.7 on 06/29/22 (89th percentile for age). She c/o mild dyspnea at her visit here in 2024. She was started on ASA but did not wish to start Crestor . Cardiac monitor in June 2025 with sinus rhythm and several short runs of SVT. Echo August 2025 with LVEF=55-60%. No valve disease.   She is here today for follow up. The patient denies any chest pain, dyspnea, palpitations, lower extremity edema, orthopnea, PND, dizziness, near syncope or syncope.   I also take care of her husband Loriel Diehl.   Primary Care Physician: Job Lukes, [place]   Past Medical History:  Diagnosis Date   Abnormal PFT    Arthritis    B12 deficiency    Chicken pox    GERD (gastroesophageal reflux disease)    High potassium    Hypertension    Liver lesion    Hepatic Adenoma   Migraines    Obesity    Vitamin D  deficiency     Past Surgical History:  Procedure Laterality Date   KNEE ARTHROSCOPY Right 01/2017   MEDIAL PARTIAL KNEE REPLACEMENT     NASAL SINUS SURGERY  02/2019    Current Outpatient Medications  Medication Sig Dispense Refill   aspirin  EC 81 MG tablet Take 1 tablet (81 mg total) by mouth daily. Swallow whole. 90 tablet 3   atenolol  (TENORMIN ) 25 MG tablet Take 1 tablet (25 mg total) by mouth daily. 90 tablet 3   cyanocobalamin  (VITAMIN B12) 1000 MCG/ML injection INJECT VIT B12 INTO THE MUSCLE ONCE A WEEK FOR 3 WEEKS, THEN USE ONCE A MONTH FOR 3 MONTHS. 3 mL 1   Estradiol  (VAGIFEM ) 10 MCG TABS vaginal tablet Insert 1 tablet twice a week by vaginal route. 18 tablet 2   hydrochlorothiazide  (HYDRODIURIL ) 25 MG tablet Take 1 tablet (25 mg total) by mouth in the morning. 90 tablet 3   nystatin  cream (MYCOSTATIN ) Apply to the  affected areas 2 times a day 30 g 3   phentermine  (ADIPEX-P ) 37.5 MG tablet Take 1 tablet (37.5 mg total) by mouth in the morning. (Patient not taking: Reported on 09/19/2023) 30 tablet 2   SYRINGE-NEEDLE, DISP, 3 ML (B-D 3CC LUER-LOK SYR 25GX1) 25G X 1 3 ML MISC USE TO INJECT VIT B12 1 each 0   triamcinolone  cream (KENALOG ) 0.1 % Apply a thin layer to affected area 2 times a day 30 g 3   zolpidem  (AMBIEN ) 5 MG tablet Take 1 tablet (5 mg total) by mouth at bedtime as needed. 90 tablet 1   No current facility-administered medications for this visit.    Allergies  Allergen Reactions   Lisinopril -Hydrochlorothiazide  Cough   Amlodipine  Other (See Comments)    Lower extremity edema.    Social History   Socioeconomic History   Marital status: Married    Spouse name: Not on file   Number of children: 3   Years of education: Not on file   Highest education level: Not on file  Occupational History   Occupation: Corporate treasurer: [employer]   Occupation: Airline pilot  Tobacco Use   Smoking status: Never   Smokeless tobacco: Never  Vaping Use   Vaping status: Never Used  Substance  and Sexual Activity   Alcohol  use: Yes    Alcohol /week: 0.0 standard drinks of alcohol     Comment: occas   Drug use: No   Sexual activity: Not Currently  Other Topics Concern   Not on file  Social History Narrative   Not on file   Social Drivers of Health   Financial Resource Strain: Not on file  Food Insecurity: Not on file  Transportation Needs: Not on file  Physical Activity: Not on file  Stress: Not on file  Social Connections: Not on file  Intimate Partner Violence: Not on file    Family History  Problem Relation Age of Onset   Diabetes Mother        late-age onset   Hyperlipidemia Mother    Breast cancer Mother    Throat cancer Father    Diabetes Sister    Hyperlipidemia Sister    Yvone' disease Sister    Stroke Brother    Liver disease Brother        s/p  liver transplant   Breast cancer Maternal Grandmother    Diabetes Maternal Grandmother    Hyperlipidemia Maternal Grandmother    Hypertension Maternal Grandmother    Dementia Maternal Grandmother    Diabetes Maternal Grandfather    Hyperlipidemia Maternal Grandfather    Breast cancer Paternal Grandmother 30   Diabetes Paternal Grandmother    Heart disease Paternal Grandmother    Hyperlipidemia Paternal Grandmother    Hypertension Paternal Grandmother    Colon cancer Neg Hx    Colon polyps Neg Hx    Rectal cancer Neg Hx    Stomach cancer Neg Hx     Review of Systems:  As stated in the HPI and otherwise negative.   LMP  (LMP Unknown)   Physical Examination: General: Well developed, well nourished, NAD  HEENT: OP clear, mucus membranes moist  SKIN: warm, dry. No rashes. Neuro: No focal deficits  Musculoskeletal: Muscle strength 5/5 all ext  Psychiatric: Mood and affect normal  Neck: No JVD, no carotid bruits, no thyromegaly, no lymphadenopathy.  Lungs:Clear bilaterally, no wheezes, rhonci, crackles Cardiovascular: Regular rate and rhythm. No murmurs, gallops or rubs. Abdomen:Soft. Bowel sounds present. Non-tender.  Extremities: No lower extremity edema. Pulses are 2 + in the bilateral DP/PT.  EKG:  EKG is *** ordered today. The ekg ordered today demonstrates   Recent Labs: 09/19/2023: ALT 48; BUN 17; Creatinine, Ser 0.70; Hemoglobin 13.5; Platelets 245; Potassium 4.5; Sodium 142; TSH 2.130   Lipid Panel    Component Value Date/Time   CHOL 182 08/15/2023 1210   TRIG 141.0 08/15/2023 1210   HDL 54.80 08/15/2023 1210   CHOLHDL 3 08/15/2023 1210   VLDL 28.2 08/15/2023 1210   LDLCALC 99 08/15/2023 1210     Wt Readings from Last 3 Encounters:  09/19/23 202 lb 3.2 oz (91.7 kg)  09/19/23 201 lb 4 oz (91.3 kg)  08/15/23 201 lb 8 oz (91.4 kg)    Assessment and Plan:   1. CAD without angina: Finding of abnormal coronary artery calcium  score in 2024. Normal LV function by  echo in 2025. No chest pain. She does not wish to take a statin. Continue ASA  2. HTN: BP is well controlled. Continue *** (? Atenolol ) Did she not tolerate Cardizem  ?   Labs/ tests ordered today include:   No orders of the defined types were placed in this encounter.  Disposition:   F/U with me in one year   Signed, Lonni Cash, MD,  [facility] 12/13/2023 12:36 PM    [facility] [address] [place], [place], [place]  [address] Phone: [phone]; Fax: [phone]

## 2023-12-14 ENCOUNTER — Encounter: Payer: Self-pay | Admitting: Cardiovascular Disease

## 2023-12-14 ENCOUNTER — Ambulatory Visit: Attending: Cardiovascular Disease | Admitting: Cardiovascular Disease

## 2023-12-14 VITALS — BP 128/88 | HR 71 | Ht 64.0 in | Wt 195.2 lb

## 2023-12-14 DIAGNOSIS — I10 Essential (primary) hypertension: Secondary | ICD-10-CM | POA: Diagnosis not present

## 2023-12-14 DIAGNOSIS — I471 Supraventricular tachycardia, unspecified: Secondary | ICD-10-CM | POA: Diagnosis not present

## 2023-12-14 DIAGNOSIS — I2510 Atherosclerotic heart disease of native coronary artery without angina pectoris: Secondary | ICD-10-CM | POA: Diagnosis not present

## 2023-12-14 NOTE — Patient Instructions (Signed)
 Medication Instructions:  No changes *If you need a refill on your cardiac medications before your next appointment, please call your pharmacy*  Lab Work: none If you have labs (blood work) drawn today and your tests are completely normal, you will receive your results only by: MyChart Message (if you have MyChart) OR A paper copy in the mail If you have any lab test that is abnormal or we need to change your treatment, we will call you to review the results.  Testing/Procedures: none  Follow-Up: At [facility], you and your health needs are our priority.  As part of our continuing mission to provide you with exceptional heart care, our providers are all part of one team.  This team includes your primary Cardiologist (physician) and Advanced Practice Providers or APPs (Physician Assistants and Nurse Practitioners) who all work together to provide you with the care you need, when you need it.  Your next appointment:   12 month(s)  Provider:   Antoinette Batman, MD

## 2024-01-11 ENCOUNTER — Telehealth: Admitting: Physician Assistant

## 2024-01-11 ENCOUNTER — Other Ambulatory Visit (HOSPITAL_COMMUNITY): Payer: Self-pay

## 2024-01-11 DIAGNOSIS — B9689 Other specified bacterial agents as the cause of diseases classified elsewhere: Secondary | ICD-10-CM

## 2024-01-11 DIAGNOSIS — J019 Acute sinusitis, unspecified: Secondary | ICD-10-CM

## 2024-01-11 MED ORDER — AMOXICILLIN-POT CLAVULANATE 875-125 MG PO TABS
1.0000 | ORAL_TABLET | Freq: Two times a day (BID) | ORAL | 0 refills | Status: AC
  Filled 2024-01-11: qty 20, 10d supply, fill #0

## 2024-01-11 NOTE — Progress Notes (Signed)

## 2024-01-11 NOTE — Progress Notes (Signed)
 I have spent 5 minutes in review of e-visit questionnaire, review and updating patient chart, medical decision making and response to patient.   Elsie Velma Lunger, PA-C

## 2024-01-29 ENCOUNTER — Ambulatory Visit: Admitting: Physician Assistant

## 2024-01-29 ENCOUNTER — Encounter: Payer: Self-pay | Admitting: Physician Assistant

## 2024-01-29 ENCOUNTER — Ambulatory Visit

## 2024-01-29 VITALS — BP 130/80 | HR 61 | Temp 97.9°F | Ht 64.0 in | Wt 198.0 lb

## 2024-01-29 DIAGNOSIS — R059 Cough, unspecified: Secondary | ICD-10-CM | POA: Diagnosis not present

## 2024-01-29 DIAGNOSIS — R0989 Other specified symptoms and signs involving the circulatory and respiratory systems: Secondary | ICD-10-CM | POA: Diagnosis not present

## 2024-01-29 DIAGNOSIS — R319 Hematuria, unspecified: Secondary | ICD-10-CM | POA: Diagnosis not present

## 2024-01-29 DIAGNOSIS — R591 Generalized enlarged lymph nodes: Secondary | ICD-10-CM | POA: Diagnosis not present

## 2024-01-29 DIAGNOSIS — E538 Deficiency of other specified B group vitamins: Secondary | ICD-10-CM | POA: Diagnosis not present

## 2024-01-29 LAB — URINALYSIS, ROUTINE W REFLEX MICROSCOPIC
Bilirubin Urine: NEGATIVE
Ketones, ur: NEGATIVE
Leukocytes,Ua: NEGATIVE
Nitrite: NEGATIVE
Specific Gravity, Urine: 1.015 (ref 1.000–1.030)
Total Protein, Urine: NEGATIVE
Urine Glucose: NEGATIVE
Urobilinogen, UA: 0.2 (ref 0.0–1.0)
pH: 6 (ref 5.0–8.0)

## 2024-01-29 LAB — CBC WITH DIFFERENTIAL/PLATELET
Basophils Absolute: 0.1 K/uL (ref 0.0–0.1)
Basophils Relative: 0.8 % (ref 0.0–3.0)
Eosinophils Absolute: 0.2 K/uL (ref 0.0–0.7)
Eosinophils Relative: 2.8 % (ref 0.0–5.0)
HCT: 39.4 % (ref 36.0–46.0)
Hemoglobin: 13.4 g/dL (ref 12.0–15.0)
Lymphocytes Relative: 33.1 % (ref 12.0–46.0)
Lymphs Abs: 2.3 K/uL (ref 0.7–4.0)
MCHC: 34 g/dL (ref 30.0–36.0)
MCV: 90 fl (ref 78.0–100.0)
Monocytes Absolute: 0.6 K/uL (ref 0.1–1.0)
Monocytes Relative: 8.7 % (ref 3.0–12.0)
Neutro Abs: 3.8 K/uL (ref 1.4–7.7)
Neutrophils Relative %: 54.6 % (ref 43.0–77.0)
Platelets: 243 K/uL (ref 150.0–400.0)
RBC: 4.37 Mil/uL (ref 3.87–5.11)
RDW: 12.6 % (ref 11.5–15.5)
WBC: 7 K/uL (ref 4.0–10.5)

## 2024-01-29 LAB — VITAMIN B12: Vitamin B-12: >1500 pg/mL — ABNORMAL HIGH (ref 211–911)

## 2024-01-29 LAB — SEDIMENTATION RATE: Sed Rate: 39 mm/h — ABNORMAL HIGH (ref 0–30)

## 2024-01-29 NOTE — Progress Notes (Signed)
 [name] [name] is a 58 y.o. female here for a follow up of a pre-existing problem.  History of Present Illness:   Chief Complaint  Patient presents with   Adenopathy    Pt c/o swollen lymph nodes right axilla, both groins, top of right chest, noticed swelling Thursday. Also having chest congestion and cough. Treated for sinus infection on 9/18 completed antibiotics.    Discussed the use of AI scribe software for clinical note transcription with the patient, who gave verbal consent to proceed.  History of Present Illness   [name] [name] is a 58 year old female who presents with persistent lymphadenopathy and cough.  She has experienced symptoms for four weeks, beginning with sinus congestion due to seasonal changes, which typically leads to sinus infections. Chest congestion developed concurrently. Over-the-counter medications were initially used, followed by an antibiotic prescribed on January 11, 2024, which resolved sinus issues but not the chest congestion. She continues to have a persistent cough.  Tenderness and swelling in the left armpit and both groins were noticed about a week ago. The lymph nodes are tender and palpable, a change from previous evaluations. A CT scan in January 2025 showed prominent inguinal lymph nodes and a 3mm right kidney stone.  She experiences fatigue, attributing it to long work hours, with previous B12 shots improving her energy levels. Her last B12 shot was in early August 2025. No recent vaccinations, ear pain, severe sore throat, or sinus pressure.        Past Medical History:  Diagnosis Date   Abnormal PFT    Arthritis    B12 deficiency    Chicken pox    GERD (gastroesophageal reflux disease)    High potassium    Hypertension    Liver lesion    Hepatic Adenoma   Migraines    Obesity    Vitamin D  deficiency      Social History   Tobacco Use   Smoking status: Never   Smokeless tobacco: Never  Vaping Use   Vaping status:  Never Used  Substance Use Topics   Alcohol  use: Yes    Alcohol /week: 0.0 standard drinks of alcohol     Comment: occas   Drug use: No    Past Surgical History:  Procedure Laterality Date   KNEE ARTHROSCOPY Right 01/2017   MEDIAL PARTIAL KNEE REPLACEMENT     NASAL SINUS SURGERY  02/2019    Family History  Problem Relation Age of Onset   Diabetes Mother        late-age onset   Hyperlipidemia Mother    Breast cancer Mother    Throat cancer Father    Diabetes Sister    Hyperlipidemia Sister    Yvone' disease Sister    Stroke Brother    Liver disease Brother        s/p liver transplant   Breast cancer Maternal Grandmother    Diabetes Maternal Grandmother    Hyperlipidemia Maternal Grandmother    Hypertension Maternal Grandmother    Dementia Maternal Grandmother    Diabetes Maternal Grandfather    Hyperlipidemia Maternal Grandfather    Breast cancer Paternal Grandmother 30   Diabetes Paternal Grandmother    Heart disease Paternal Grandmother    Hyperlipidemia Paternal Grandmother    Hypertension Paternal Grandmother    Colon cancer Neg Hx    Colon polyps Neg Hx    Rectal cancer Neg Hx    Stomach cancer Neg Hx     Allergies  Allergen Reactions  Lisinopril -Hydrochlorothiazide  Cough   Amlodipine  Other (See Comments)    Lower extremity edema.    Current Medications:   Current Outpatient Medications:    aspirin  EC 81 MG tablet, Take 1 tablet (81 mg total) by mouth daily. Swallow whole., Disp: 90 tablet, Rfl: 3   atenolol  (TENORMIN ) 25 MG tablet, Take 1 tablet (25 mg total) by mouth daily., Disp: 90 tablet, Rfl: 3   cyanocobalamin  (VITAMIN B12) 1000 MCG/ML injection, INJECT VIT B12 INTO THE MUSCLE ONCE A WEEK FOR 3 WEEKS, THEN USE ONCE A MONTH FOR 3 MONTHS., Disp: 3 mL, Rfl: 1   Estradiol  (VAGIFEM ) 10 MCG TABS vaginal tablet, Insert 1 tablet twice a week by vaginal route., Disp: 18 tablet, Rfl: 2   hydrochlorothiazide  (HYDRODIURIL ) 25 MG tablet, Take 1 tablet (25 mg  total) by mouth in the morning., Disp: 90 tablet, Rfl: 3   nystatin  cream (MYCOSTATIN ), Apply to the affected areas 2 times a day, Disp: 30 g, Rfl: 3   SYRINGE-NEEDLE, DISP, 3 ML (B-D 3CC LUER-LOK SYR 25GX1) 25G X 1 3 ML MISC, USE TO INJECT VIT B12, Disp: 1 each, Rfl: 0   triamcinolone  cream (KENALOG ) 0.1 %, Apply a thin layer to affected area 2 times a day, Disp: 30 g, Rfl: 3   zolpidem  (AMBIEN ) 5 MG tablet, Take 1 tablet (5 mg total) by mouth at bedtime as needed., Disp: 90 tablet, Rfl: 1   phentermine  (ADIPEX-P ) 37.5 MG tablet, Take 1 tablet (37.5 mg total) by mouth in the morning. (Patient not taking: Reported on 01/29/2024), Disp: 30 tablet, Rfl: 2   Review of Systems:   Negative unless otherwise specified per HPI.  Vitals:   Vitals:   01/29/24 1129  BP: 130/80  Pulse: 61  Temp: 97.9 F (36.6 C)  TempSrc: Temporal  SpO2: 94%  Weight: 198 lb (89.8 kg)  Height: 5' 4 (1.626 m)     Body mass index is 33.99 kg/m.  Physical Exam:   Physical Exam Vitals and nursing note reviewed.  Constitutional:      General: She is not in acute distress.    Appearance: She is well-developed. She is not ill-appearing or toxic-appearing.  Cardiovascular:     Rate and Rhythm: Normal rate and regular rhythm.     Pulses: Normal pulses.     Heart sounds: Normal heart sounds, S1 normal and S2 normal.  Pulmonary:     Effort: Pulmonary effort is normal.     Breath sounds: Normal breath sounds.  Lymphadenopathy:     Cervical: Cervical adenopathy present.     Upper Body:     Left upper body: Pectoral adenopathy present.     Lower Body: Right inguinal adenopathy present.  Skin:    General: Skin is warm and dry.  Neurological:     Mental Status: She is alert.     GCS: GCS eye subscore is 4. GCS verbal subscore is 5. GCS motor subscore is 6.  Psychiatric:        Speech: Speech normal.        Behavior: Behavior normal. Behavior is cooperative.     Assessment and Plan:   Assessment and  Plan    Lymphadenopathy (right inguinal and left pectoral) Bilateral inguinal and left axillary lymphadenopathy with tenderness. Differential includes reactive versus sinister causes. - Order blood work to assess underlying causes. - Consider imaging of the groin and or left breast if lymphadenopathy persists beyond two weeks or worsens. - Advise monitoring of lymph node changes and  report persistence or worsening.  Chest congestion Persistent cough for four weeks post-sinus infection.  - Order chest x-ray to evaluate persistent cough. - Advise follow-up if cough persists.  B12 deficiency Fatigue possibly related to work schedule and lifestyle. Previous improvement with B12 injections. - Order blood work including B12 levels.  Hematuria Right kidney stone identified on CT. - Order urinalysis to check for hematuria.          Lucie Buttner, PA-C

## 2024-01-29 NOTE — Patient Instructions (Addendum)
  VISIT SUMMARY: Today, we addressed your persistent lymph node swelling, ongoing cough, fatigue, and a previously identified kidney stone. We have ordered several tests to better understand and manage these issues.  YOUR PLAN: LYMPHADENOPATHY (SWOLLEN LYMPH NODES): You have tender and swollen lymph nodes in your left armpit and both groins. -We will do blood work to find out the cause of the swelling. -If the swelling does not improve or gets worse in two weeks, we may need to do imaging of the groin and/or mammogram with ultrasound  -Please monitor the lymph nodes and let us  know if they stay swollen or get worse.  PERSISTENT COUGH: You have had a cough for four weeks following a sinus infection. -We will do a chest x-ray to check the cause of your persistent cough. -Please follow up if the cough does not improve.  FATIGUE: You are feeling tired, which may be related to your work schedule and lifestyle. You have felt better with B12 shots in the past. -We will do blood work to check your B12 levels.                      Contains text generated by [employer]bridge.                                 Contains text generated by Abridge.

## 2024-01-30 ENCOUNTER — Ambulatory Visit: Payer: Self-pay | Admitting: Physician Assistant

## 2024-01-30 LAB — COMPREHENSIVE METABOLIC PANEL WITH GFR
ALT: 74 U/L — ABNORMAL HIGH (ref 0–35)
AST: 61 U/L — ABNORMAL HIGH (ref 0–37)
Albumin: 4.2 g/dL (ref 3.5–5.2)
Alkaline Phosphatase: 71 U/L (ref 39–117)
BUN: 12 mg/dL (ref 6–23)
CO2: 25 meq/L (ref 19–32)
Calcium: 9.6 mg/dL (ref 8.4–10.5)
Chloride: 102 meq/L (ref 96–112)
Creatinine, Ser: 0.76 mg/dL (ref 0.40–1.20)
GFR: 85.58 mL/min (ref >60.00)
Glucose, Bld: 78 mg/dL (ref 70–99)
Potassium: 3.7 meq/L (ref 3.5–5.1)
Sodium: 138 meq/L (ref 135–145)
Total Bilirubin: 0.8 mg/dL (ref 0.2–1.2)
Total Protein: 7.5 g/dL (ref 6.0–8.3)

## 2024-01-30 LAB — C-REACTIVE PROTEIN: CRP: 0.7 mg/dL (ref 0.5–20.0)

## 2024-02-06 ENCOUNTER — Other Ambulatory Visit: Payer: Self-pay | Admitting: Physician Assistant

## 2024-02-06 DIAGNOSIS — R748 Abnormal levels of other serum enzymes: Secondary | ICD-10-CM

## 2024-02-07 ENCOUNTER — Other Ambulatory Visit (HOSPITAL_COMMUNITY): Payer: Self-pay

## 2024-02-07 ENCOUNTER — Other Ambulatory Visit: Payer: Self-pay | Admitting: Physician Assistant

## 2024-02-07 MED ORDER — DOXYCYCLINE HYCLATE 100 MG PO TABS
100.0000 mg | ORAL_TABLET | Freq: Two times a day (BID) | ORAL | 0 refills | Status: DC
  Filled 2024-02-07: qty 20, 10d supply, fill #0

## 2024-03-01 ENCOUNTER — Encounter: Payer: Self-pay | Admitting: Gastroenterology

## 2024-03-11 ENCOUNTER — Encounter: Payer: Self-pay | Admitting: Physician Assistant

## 2024-03-11 ENCOUNTER — Telehealth: Admitting: Emergency Medicine

## 2024-03-11 DIAGNOSIS — Z760 Encounter for issue of repeat prescription: Secondary | ICD-10-CM

## 2024-03-11 NOTE — Telephone Encounter (Signed)
 Pt requesting refill for Zolpidem  5 mg tablet. Pt is scheduled for an appt on 11/25.

## 2024-03-11 NOTE — Progress Notes (Signed)
 Unable to hear patient. She could hear me. I explained I am not with Ms. Worley's office (PCP) and to contact her office for refills of zolpidem .   Jon Belt, PhD, FNP-BC [facility] Phone: [phone] 03/11/2024 2:12 PM

## 2024-03-12 ENCOUNTER — Other Ambulatory Visit: Payer: Self-pay | Admitting: Physician Assistant

## 2024-03-12 ENCOUNTER — Other Ambulatory Visit (HOSPITAL_COMMUNITY): Payer: Self-pay

## 2024-03-12 MED ORDER — ZOLPIDEM TARTRATE 5 MG PO TABS
5.0000 mg | ORAL_TABLET | Freq: Every evening | ORAL | 0 refills | Status: DC
  Filled 2024-03-12: qty 30, 30d supply, fill #0

## 2024-03-13 ENCOUNTER — Other Ambulatory Visit: Payer: Self-pay | Admitting: Physician Assistant

## 2024-03-13 ENCOUNTER — Other Ambulatory Visit (HOSPITAL_COMMUNITY): Payer: Self-pay

## 2024-03-13 ENCOUNTER — Telehealth (INDEPENDENT_AMBULATORY_CARE_PROVIDER_SITE_OTHER): Admitting: Physician Assistant

## 2024-03-13 ENCOUNTER — Encounter: Payer: Self-pay | Admitting: Physician Assistant

## 2024-03-13 VITALS — Ht 64.0 in | Wt 199.0 lb

## 2024-03-13 DIAGNOSIS — M79622 Pain in left upper arm: Secondary | ICD-10-CM | POA: Diagnosis not present

## 2024-03-13 DIAGNOSIS — E669 Obesity, unspecified: Secondary | ICD-10-CM

## 2024-03-13 DIAGNOSIS — R748 Abnormal levels of other serum enzymes: Secondary | ICD-10-CM

## 2024-03-13 DIAGNOSIS — L304 Erythema intertrigo: Secondary | ICD-10-CM

## 2024-03-13 DIAGNOSIS — R1031 Right lower quadrant pain: Secondary | ICD-10-CM | POA: Diagnosis not present

## 2024-03-13 DIAGNOSIS — F5101 Primary insomnia: Secondary | ICD-10-CM

## 2024-03-13 MED ORDER — ZOLPIDEM TARTRATE 5 MG PO TABS
5.0000 mg | ORAL_TABLET | Freq: Every evening | ORAL | 1 refills | Status: AC
  Filled 2024-04-10 – 2024-04-11 (×2): qty 90, 90d supply, fill #0

## 2024-03-13 MED ORDER — KETOCONAZOLE 2 % EX CREA
1.0000 | TOPICAL_CREAM | Freq: Every day | CUTANEOUS | 0 refills | Status: AC
  Filled 2024-03-13: qty 60, 30d supply, fill #0

## 2024-03-13 MED ORDER — PHENTERMINE HCL 37.5 MG PO TABS
37.5000 mg | ORAL_TABLET | Freq: Every morning | ORAL | 2 refills | Status: AC
  Filled 2024-03-13: qty 30, 30d supply, fill #0
  Filled 2024-04-23: qty 30, 30d supply, fill #1
  Filled 2024-05-30: qty 30, 30d supply, fill #2

## 2024-03-13 NOTE — Progress Notes (Signed)
 Virtual Visit via Video Note   I, Lucie Buttner, connected with  [name] [name]  ([id number], [birth year]) on 03/13/24 at  1:40 PM EST by a video-enabled telemedicine application and verified that I am speaking with the correct person using two identifiers.  Location: Patient: Home Provider: [facility] office   I discussed the limitations of evaluation and management by telemedicine and the availability of in person appointments. The patient expressed understanding and agreed to proceed.    Discussed the use of AI scribe software for clinical note transcription with the patient, who gave verbal consent to proceed.  History of Present Illness   [name] [name] is a 58 year old female who presents with tenderness in the left armpit and right groin area.  She experiences ongoing tenderness in the left armpit without a distinct lump or nodule. She avoids probing the area to prevent soreness. No lymph nodes are noted in the neck, and there is no distinct mass or nodule in the left armpit.  She is concerned about a knot-like swelling in the right groin area, with sharp pain in the abdomen below the belly button, especially when bending over. The pain is sharp and localized to the groin area at the top of the thigh and abdomen on the right side.  She recently completed a course of doxycycline , which resolved chest congestion and cough. She has been off phentermine  since July but is interested in resuming it for appetite control. She uses Ambien  at a low dose to aid sleep and applies triamcinolone  cream to the stomach fold, though it has not alleviated irritation.         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   Medications:  Current Outpatient Medications:    aspirin  EC 81 MG tablet, Take 1 tablet (81 mg total) by mouth daily. Swallow whole., Disp: 90 tablet, Rfl: 3   atenolol  (TENORMIN ) 25 MG tablet, Take 1 tablet (25 mg total) by mouth daily., Disp: 90 tablet, Rfl: 3   Estradiol  (VAGIFEM ) 10 MCG TABS vaginal tablet, Insert 1 tablet twice a week by vaginal route., Disp: 18 tablet, Rfl: 2   hydrochlorothiazide  (HYDRODIURIL ) 25 MG tablet, Take 1 tablet (25 mg total) by mouth in the morning., Disp: 90 tablet, Rfl: 3   nystatin  cream (MYCOSTATIN ), Apply to the affected areas 2 times a day, Disp: 30 g, Rfl: 3   phentermine  (ADIPEX-P ) 37.5 MG tablet, Take 1 tablet (37.5 mg total) by mouth in the morning., Disp: 30 tablet, Rfl: 2   triamcinolone  cream (KENALOG ) 0.1 %, Apply a thin layer to affected area 2 times a day, Disp: 30 g, Rfl: 3   zolpidem  (AMBIEN ) 5 MG tablet, Take 1 tablet (5 mg total) by mouth at bedtime as needed., Disp: 30 tablet, Rfl: 0  Observations/Objective: Patient is well-developed, well-nourished in no acute distress.  Resting comfortably  at home.  Head is normocephalic, atraumatic.  No labored breathing.  Speech is  clear and coherent with logical content.  Patient is alert and oriented at baseline.   Assessment and Plan    Right groin pain and swelling Intermittent sharp pain in the right groin, worsened by bending, with swelling sensation. Possible hernia. - Ordered lower pelvic ultrasound at [facility].  Left axillary tenderness Occasional tenderness in left axillary region without palpable mass. Benign causes possible. - Ordered ultrasound of left axillary region.  Obesity Phentermine  effective  for stress eating and weight loss. She wishes to restart after stopping in July. - Prescribed phentermine .  Intertrigo in abdominal fold Persistent irritation despite triamcinolone  cream. - Prescribed alternative cream for intertrigo.  Abnormal liver enzymes (pending evaluation) Awaiting gastroenterology evaluation for abnormal liver enzymes. - Continue follow up with gastroenterology appointment.     Primary insomnia Doing well with Ambien  5 mg daily as needed Reports there is no falls or other issues with this medication   Follow Up Instructions: I discussed the assessment and treatment plan with the patient. The patient was provided an opportunity to ask questions and all were answered. The patient agreed with the plan and demonstrated an understanding of the instructions.  A copy of instructions were sent to the patient via MyChart unless otherwise noted below.   The patient was advised to call back or seek an in-person evaluation if the symptoms worsen or if the condition fails to improve as anticipated.  Lucie Buttner, [place]

## 2024-03-18 ENCOUNTER — Other Ambulatory Visit (INDEPENDENT_AMBULATORY_CARE_PROVIDER_SITE_OTHER)

## 2024-03-18 ENCOUNTER — Encounter: Payer: Self-pay | Admitting: Gastroenterology

## 2024-03-18 ENCOUNTER — Ambulatory Visit: Admitting: Gastroenterology

## 2024-03-18 VITALS — BP 130/70 | HR 62 | Ht 63.5 in | Wt 205.0 lb

## 2024-03-18 DIAGNOSIS — R748 Abnormal levels of other serum enzymes: Secondary | ICD-10-CM

## 2024-03-18 DIAGNOSIS — K760 Fatty (change of) liver, not elsewhere classified: Secondary | ICD-10-CM

## 2024-03-18 DIAGNOSIS — E66812 Obesity, class 2: Secondary | ICD-10-CM

## 2024-03-18 DIAGNOSIS — R932 Abnormal findings on diagnostic imaging of liver and biliary tract: Secondary | ICD-10-CM

## 2024-03-18 DIAGNOSIS — Z6835 Body mass index (BMI) 35.0-35.9, adult: Secondary | ICD-10-CM

## 2024-03-18 LAB — IBC + FERRITIN
Ferritin: 108.4 ng/mL (ref 10.0–291.0)
Iron: 133 ug/dL (ref 42–145)
Saturation Ratios: 38.3 % (ref 20.0–50.0)
TIBC: 347.2 ug/dL (ref 250.0–450.0)
Transferrin: 248 mg/dL (ref 212.0–360.0)

## 2024-03-18 LAB — PROTIME-INR
INR: 1.1 ratio — ABNORMAL HIGH (ref 0.8–1.0)
Prothrombin Time: 11.6 s (ref 9.6–13.1)

## 2024-03-18 NOTE — Progress Notes (Signed)
 HPI :  58 year old female with a history of elevated liver enzymes, fatty liver, liver lesion noted on prior imaging, here to reestablish care for these issues, referred by Lucie Buttner, PA.  She previously was followed closely by Dr. Aneita but has not been seen since 2017.  This is my first time seeing her.  Chart reviewed extensively.  She has had liver lesions noted on imaging dating back to 2008.  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  Consideration for repeat MRI with Eovist at that time was recommended, I do not see that she has ever had that done.  She has had follow-up imaging with ultrasound in May 2025 and in March 2021.  Exams as outlined below, steatosis noted on both exams without evidence of mass lesions or hepatoma's.  No evidence of cirrhosis  On review of her chart her liver enzymes have been elevated at least since 2018 I see if fluctuating mild transaminitis ALT anywhere from 40s to 70s.  Most recently her labs were done on October 6, ALT 74, AST 61, alk phos 71, T. bili 0.8.  Platelet count 243.  A1c 5.9. I do not see prior serologic workup done to evaluate this, transaminitis has been presumed due to steatosis.  She does not take any medications at baseline to be causing this.  She endorses strong family history of cirrhosis.  Her brother had cirrhosis diagnosed at age 82 and underwent a liver transplant.  She thinks this was due to fatty liver but he also drank alcohol .  No other family history noted of liver disease.  She uses alcohol  socially, does not use with any significance to be causing this.  No other new medications recently.  Her weight has been stable over the past year, currently weighs 205 pounds with a BMI of 35.7.  She does not drink coffee.   Colonoscopy 05/08/2015: Dr. Aneita - normal, told to repeat in 10 years  Echo 08/2023: IMPRESSIONS   1. Left ventricular ejection fraction,  by estimation, is 55 to 60%. Left  ventricular ejection fraction by 3D volume is 59 %. The left ventricle has  normal function. The left ventricle has no regional wall motion  abnormalities. Left ventricular diastolic   parameters were normal. The average left ventricular global longitudinal  strain is -21.5 %. The global longitudinal strain is normal.   2. Right ventricular systolic function is normal. The right ventricular  size is normal. There is normal pulmonary artery systolic pressure. The  estimated right ventricular systolic pressure is 25.1 mmHg.   3. The mitral valve is normal in structure. No evidence of mitral valve  regurgitation. No evidence of mitral stenosis.   4. The aortic valve is tricuspid. Aortic valve regurgitation is not  visualized. No aortic stenosis is present.   5. The inferior vena cava is normal in size with greater than 50%  respiratory variability, suggesting right atrial pressure of 3 mmHg.    US  abdomen 09/01/23: IMPRESSION: 1. No acute abnormality identified. 2. Increased echotexture of the liver. This is a nonspecific finding but can be seen in fatty infiltration of liver.  US  07/16/19: IMPRESSION: Probable fatty infiltration of liver with focal sparing adjacent to gallbladder fossa. Mass lesions identified within liver on prior MR exam are not sonographically delineated on current study; if these require follow-up to demonstrate stability, recommend MR assessment with and without contrast. Small focus of suspected cholesterolosis  of the gallbladder wall.   Remainder of exam unremarkable.  Lab Results  Component Value Date   ALT 74 (H) 01/29/2024   AST 61 (H) 01/29/2024   ALKPHOS 71 01/29/2024   BILITOT 0.8 01/29/2024    MRI liver 03/2010: IMPRESSION:  1. Enhancing lesion within the left hepatic lobe is stable  compared to multiple prior exams.  This likely represents a focal  nodular hyperplasia or hepatic adenoma.  A hepatocyte   specific  contrast agent  (EOVIST)  may be more specific.  2.  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     Past Medical History:  Diagnosis Date   Abnormal PFT    Arthritis    B12 deficiency    Chicken pox    GERD (gastroesophageal reflux disease)    High potassium    Hypertension    Liver lesion    Hepatic Adenoma   Metabolic dysfunction-associated steatotic liver disease (MASLD)    Migraines    Obesity    Vitamin D  deficiency      Past Surgical History:  Procedure Laterality Date   KNEE ARTHROSCOPY Right 01/2017   MEDIAL PARTIAL KNEE REPLACEMENT     NASAL SINUS SURGERY  02/2019   Family History  Problem Relation Age of Onset   Diabetes Mother        late-age onset   Hyperlipidemia Mother    Breast cancer Mother    Throat cancer Father    Diabetes Sister    Hyperlipidemia Sister    Yvone' disease Sister    Stroke Brother    Liver disease Brother        s/p liver transplant   Breast cancer Maternal Grandmother    Diabetes Maternal Grandmother    Hyperlipidemia Maternal Grandmother    Hypertension Maternal Grandmother    Dementia Maternal Grandmother    Diabetes Maternal Grandfather    Hyperlipidemia Maternal Grandfather    Breast cancer Paternal Grandmother 30   Diabetes Paternal Grandmother    Heart disease Paternal Grandmother    Hyperlipidemia Paternal Grandmother    Hypertension Paternal Grandmother    Colon cancer Neg Hx    Colon polyps Neg Hx    Rectal cancer Neg Hx    Stomach cancer Neg Hx    Social History   Tobacco Use   Smoking status: Never   Smokeless tobacco: Never  Vaping Use   Vaping status: Never Used  Substance Use Topics   Alcohol  use: Yes    Alcohol /week: 0.0 standard drinks of alcohol     Comment: occas   Drug use: No   Current Outpatient Medications  Medication Sig Dispense Refill   aspirin  EC 81 MG tablet Take 1 tablet (81 mg total) by mouth daily. Swallow whole.  90 tablet 3   atenolol  (TENORMIN ) 25 MG tablet Take 1 tablet (25 mg total) by mouth daily. 90 tablet 3   Estradiol  (VAGIFEM ) 10 MCG TABS vaginal tablet Insert 1 tablet twice a week by vaginal route. 18 tablet 2   hydrochlorothiazide  (HYDRODIURIL ) 25 MG tablet Take 1 tablet (25 mg total) by mouth in the morning. 90 tablet 3   ketoconazole  (NIZORAL ) 2 % cream Apply topically once to twice daily. 60 g 0   phentermine  (ADIPEX-P ) 37.5 MG tablet Take 1 tablet (37.5 mg total) by mouth in the morning. 30 tablet 2   zolpidem  (AMBIEN ) 5 MG tablet Take 1 tablet (5 mg total) by mouth at bedtime  as needed. 90 tablet 1   No current facility-administered medications for this visit.   Allergies  Allergen Reactions   Lisinopril -Hydrochlorothiazide  Cough   Amlodipine  Other (See Comments)    Lower extremity edema.     Review of Systems: All systems reviewed and negative except where noted in HPI.   Lab Results  Component Value Date   WBC 7.0 01/29/2024   HGB 13.4 01/29/2024   HCT 39.4 01/29/2024   MCV 90.0 01/29/2024   PLT 243.0 01/29/2024    Lab Results  Component Value Date   WBC 7.0 01/29/2024   HGB 13.4 01/29/2024   HCT 39.4 01/29/2024   MCV 90.0 01/29/2024   PLT 243.0 01/29/2024    Lab Results  Component Value Date   INR 1.1 (H) 03/18/2024   INR 1.0 RATIO 02/13/2007     Fibrosis 4 Score = 1.72 (Indeterminate)       Interpretation for patients with NAFLD          <1.30       -  F0-F1 (Low risk)          1.30-2.67 -  Indeterminate           >2.67      -  F3-F4 (High risk)     Validated for ages 27-65         Physical Exam: BP 130/70   Pulse 62   Ht 5' 3.5 (1.613 m)   Wt 205 lb (93 kg)   LMP  (LMP Unknown)   BMI 35.74 kg/m  Constitutional: Pleasant,well-developed, female in no acute distress. HEENT: Normocephalic and atraumatic. Conjunctivae are normal. No scleral icterus. Neck supple.  Cardiovascular: Normal rate, regular rhythm.  Pulmonary/chest: Effort normal  and breath sounds normal. No wheezing, rales or rhonchi. Abdominal: Soft, nondistended, nontender. There are no masses palpable. No hepatomegaly. Extremities: no edema Lymphadenopathy: No cervical adenopathy noted. Neurological: Alert and oriented to person place and time. Skin: Skin is warm and dry. No rashes noted. Psychiatric: Normal mood and affect. Behavior is normal.   ASSESSMENT: 58 y.o. female here for assessment of the following  1. Metabolic dysfunction-associated steatotic liver disease (MASLD)   2. Elevated liver enzymes   3. Class 2 severe obesity with serious comorbidity and body mass index (BMI) of 35.0 to 35.9 in adult, unspecified obesity type   4. Abnormal MRI, liver    Reviewed workup with her.  She has had a mild transaminitis for years.  She has also had imaging dating back years suggesting hepatic adenoma versus FNH, and hepatic steatosis at baseline.  On more recent ultrasounds these lesions have not been seen.  Discussed MASLD with her, risks for fibrosis and cirrhosis over time.  I suspect this is likely causing her chronic transaminitis however she does warrant a serologic evaluation to exclude other causes of chronic liver disease.  She also needs testing for immunity to hepatitis A and B and vaccination if needed.  If serologic workup is negative then presumably transaminitis is related to fatty liver.  We discussed management of MASLD.  Discussed importance of weight loss to normalize body mass index.  Offered her referral to nutritionist or weight loss clinic, she wants to hold off for now and speak with her daughter-in-law who is a health and safety inspector.  She will work on this.  Fib 4 score is intermediate for fibrosis.  Ultimately given duration of enzyme elevation I think she is an excellent candidate for a FibroScan to assess for fibrotic change.  We should have this at our office in the upcoming months and we will put her on the wait list to get this scheduled.  She is  agreeable with this.  If she does have advanced fibrosis I think she may be a good candidate for GLP-1 agonist.  Otherwise we discussed lifestyle change, I encouraged routine coffee intake as that can reduce the risk for fibrotic change in the setting of fatty liver.  She does not drink coffee but will consider trying it.  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  Recommending MRI of the liver with Eovist to ensure no evidence of an adenoma and ensure no long-term surveillance is needed.  She is agreeable with this, further recommendations pending results   PLAN: - serologic lab workup today to exclude other causes of liver disease - check immunity to hepatitis A and B and vaccinate if needed - MRI liver with Eovist - rule out adenoma - recommend Fibroscan - will add to our wait list to get done in next few months - weight loss recommended - declined nutritionist / weight loss clinic for now - discussed possible GLP1 agonist pending her course - recommend routine coffee intake if possible - f/u 6 months or sooner with issues  Marcey Naval, MD [facility]  CC: Job Lukes, [place]

## 2024-03-18 NOTE — Patient Instructions (Addendum)
 Please go to the lab in the basement of our building to have lab work done as you leave today. Hit B for basement when you get on the elevator.  When the doors open the lab is on your left.  We will call you with the results. Thank you.  You have been scheduled for an MRI Liver at [facility], located at [address] . Your appointment is scheduled for ____________________ at ___________. Please arrive ____ minutes prior to your appointment time for registration purposes. Please make certain not to have anything to eat or drink 6 hours prior to your test. In addition, if you have any metal in your body, have a pacemaker or defibrillator, please be sure to let your ordering physician know. This test typically takes 45 minutes to 1 hour to complete. Should you need to reschedule, please call [phone] to do so.  Please let us  know if you would like Valium for your procedure.  Thank you for entrusting me with your care and for choosing [facility], Dr. Elspeth Naval   _______________________________________________________  If your blood pressure at your visit was 140/90 or greater, please contact your primary care physician to follow up on this.  _______________________________________________________  If you are age 35 or older, your body mass index should be between 23-30. Your Body mass index is 35.74 kg/m. If this is out of the aforementioned range listed, please consider follow up with your Primary Care Provider.  If you are age 54 or younger, your body mass index should be between 19-25. Your Body mass index is 35.74 kg/m. If this is out of the aformentioned range listed, please consider follow up with your Primary Care Provider.   ________________________________________________________  The [facility] providers would like to encourage you to use MYCHART to communicate with providers for non-urgent requests or questions.  Due to long hold times on the telephone,  sending your provider a message by [facility] may be a faster and more efficient way to get a response.  Please allow 48 business hours for a response.  Please remember that this is for non-urgent requests.  _______________________________________________________  [facility] is using a team-based approach to care.  Your team is made up of your doctor and two to three APPS. Our APPS (Nurse Practitioners and Physician Assistants) work with your physician to ensure care continuity for you. They are fully qualified to address your health concerns and develop a treatment plan. They communicate directly with your gastroenterologist to care for you. Seeing the Advanced Practice Practitioners on your physician's team can help you by facilitating care more promptly, often allowing for earlier appointments, access to diagnostic testing, procedures, and other specialty referrals.

## 2024-03-19 ENCOUNTER — Ambulatory Visit: Admitting: Physician Assistant

## 2024-03-20 ENCOUNTER — Other Ambulatory Visit (HOSPITAL_BASED_OUTPATIENT_CLINIC_OR_DEPARTMENT_OTHER): Payer: Self-pay

## 2024-03-20 ENCOUNTER — Ambulatory Visit
Admission: RE | Admit: 2024-03-20 | Discharge: 2024-03-20 | Disposition: A | Source: Ambulatory Visit | Attending: Physician Assistant

## 2024-03-20 DIAGNOSIS — R1909 Other intra-abdominal and pelvic swelling, mass and lump: Secondary | ICD-10-CM | POA: Diagnosis not present

## 2024-03-20 DIAGNOSIS — R1031 Right lower quadrant pain: Secondary | ICD-10-CM

## 2024-03-20 LAB — HEPATITIS B SURFACE ANTIBODY,QUALITATIVE: Hep B S Ab: NONREACTIVE

## 2024-03-20 LAB — IGG: IgG (Immunoglobin G), Serum: 1250 mg/dL (ref 600–1640)

## 2024-03-20 LAB — ANTI-NUCLEAR AB-TITER (ANA TITER)
ANA TITER: 1:40 {titer} — ABNORMAL HIGH
ANA Titer 1: 1:80 {titer} — ABNORMAL HIGH

## 2024-03-20 LAB — HEPATITIS A ANTIBODY, TOTAL: Hepatitis A AB,Total: REACTIVE — AB

## 2024-03-20 LAB — ANTI-SMOOTH MUSCLE ANTIBODY, IGG: Actin (Smooth Muscle) Antibody (IGG): <20 U (ref <20)

## 2024-03-20 LAB — CERULOPLASMIN: Ceruloplasmin: 28 mg/dL (ref 14–48)

## 2024-03-20 LAB — HEPATITIS C ANTIBODY: Hepatitis C Ab: NONREACTIVE

## 2024-03-20 LAB — ALPHA-1-ANTITRYPSIN: A-1 Antitrypsin, Ser: 138 mg/dL (ref 83–199)

## 2024-03-20 LAB — ANA: Anti Nuclear Antibody (ANA): POSITIVE — AB

## 2024-03-20 LAB — HEPATITIS B SURFACE ANTIGEN: Hepatitis B Surface Ag: NONREACTIVE

## 2024-03-20 MED ORDER — FLUZONE 0.5 ML IM SUSY
0.5000 mL | PREFILLED_SYRINGE | Freq: Once | INTRAMUSCULAR | 0 refills | Status: AC
  Filled 2024-03-20: qty 0.5, 1d supply, fill #0

## 2024-03-22 ENCOUNTER — Ambulatory Visit: Payer: Self-pay | Admitting: Gastroenterology

## 2024-03-27 ENCOUNTER — Other Ambulatory Visit: Payer: Self-pay

## 2024-03-28 ENCOUNTER — Ambulatory Visit

## 2024-03-28 DIAGNOSIS — Z23 Encounter for immunization: Secondary | ICD-10-CM | POA: Diagnosis not present

## 2024-03-28 DIAGNOSIS — R748 Abnormal levels of other serum enzymes: Secondary | ICD-10-CM

## 2024-03-28 DIAGNOSIS — R932 Abnormal findings on diagnostic imaging of liver and biliary tract: Secondary | ICD-10-CM

## 2024-03-28 DIAGNOSIS — K760 Fatty (change of) liver, not elsewhere classified: Secondary | ICD-10-CM

## 2024-04-01 ENCOUNTER — Ambulatory Visit: Payer: Self-pay | Admitting: Physician Assistant

## 2024-04-03 ENCOUNTER — Inpatient Hospital Stay: Admission: RE | Admit: 2024-04-03 | Discharge: 2024-04-03 | Attending: Physician Assistant

## 2024-04-03 DIAGNOSIS — N6489 Other specified disorders of breast: Secondary | ICD-10-CM | POA: Diagnosis not present

## 2024-04-03 DIAGNOSIS — M79622 Pain in left upper arm: Secondary | ICD-10-CM

## 2024-04-03 DIAGNOSIS — R928 Other abnormal and inconclusive findings on diagnostic imaging of breast: Secondary | ICD-10-CM | POA: Diagnosis not present

## 2024-04-04 ENCOUNTER — Inpatient Hospital Stay: Admission: RE | Admit: 2024-04-04 | Discharge: 2024-04-04 | Attending: Gastroenterology

## 2024-04-04 DIAGNOSIS — K760 Fatty (change of) liver, not elsewhere classified: Secondary | ICD-10-CM | POA: Diagnosis not present

## 2024-04-04 DIAGNOSIS — N281 Cyst of kidney, acquired: Secondary | ICD-10-CM | POA: Diagnosis not present

## 2024-04-04 MED ORDER — GADOXETATE DISODIUM 0.25 MMOL/ML IV SOLN
9.0000 mL | Freq: Once | INTRAVENOUS | Status: AC | PRN
  Administered 2024-04-04: 11:00:00 9 mL via INTRAVENOUS

## 2024-04-10 ENCOUNTER — Other Ambulatory Visit (HOSPITAL_COMMUNITY): Payer: Self-pay

## 2024-04-10 ENCOUNTER — Other Ambulatory Visit: Payer: Self-pay

## 2024-04-11 ENCOUNTER — Other Ambulatory Visit (HOSPITAL_COMMUNITY): Payer: Self-pay

## 2024-04-23 ENCOUNTER — Other Ambulatory Visit (HOSPITAL_COMMUNITY): Payer: Self-pay

## 2024-04-29 ENCOUNTER — Ambulatory Visit

## 2024-05-01 ENCOUNTER — Ambulatory Visit

## 2024-05-01 DIAGNOSIS — Z23 Encounter for immunization: Secondary | ICD-10-CM

## 2024-05-30 ENCOUNTER — Other Ambulatory Visit (HOSPITAL_COMMUNITY): Payer: Self-pay

## 2024-05-30 ENCOUNTER — Other Ambulatory Visit: Payer: Self-pay
